# Patient Record
Sex: Male | Born: 1950 | Race: White | Hispanic: No | Marital: Married | Smoking: Never smoker
Health system: Southern US, Community
[De-identification: ages and names within clinical notes are randomized; demographics above are authoritative.]

## PROBLEM LIST (undated history)

## (undated) DIAGNOSIS — I219 Acute myocardial infarction, unspecified: Secondary | ICD-10-CM

## (undated) DIAGNOSIS — F419 Anxiety disorder, unspecified: Secondary | ICD-10-CM

## (undated) DIAGNOSIS — E7800 Pure hypercholesterolemia, unspecified: Secondary | ICD-10-CM

## (undated) DIAGNOSIS — T4145XA Adverse effect of unspecified anesthetic, initial encounter: Secondary | ICD-10-CM

## (undated) DIAGNOSIS — I2089 Other forms of angina pectoris: Secondary | ICD-10-CM

## (undated) DIAGNOSIS — I739 Peripheral vascular disease, unspecified: Secondary | ICD-10-CM

## (undated) DIAGNOSIS — T8859XA Other complications of anesthesia, initial encounter: Secondary | ICD-10-CM

## (undated) DIAGNOSIS — F32A Depression, unspecified: Secondary | ICD-10-CM

## (undated) DIAGNOSIS — K635 Polyp of colon: Secondary | ICD-10-CM

## (undated) DIAGNOSIS — M1990 Unspecified osteoarthritis, unspecified site: Secondary | ICD-10-CM

## (undated) DIAGNOSIS — I10 Essential (primary) hypertension: Secondary | ICD-10-CM

## (undated) DIAGNOSIS — I2510 Atherosclerotic heart disease of native coronary artery without angina pectoris: Secondary | ICD-10-CM

## (undated) DIAGNOSIS — I208 Other forms of angina pectoris: Secondary | ICD-10-CM

## (undated) DIAGNOSIS — G4733 Obstructive sleep apnea (adult) (pediatric): Secondary | ICD-10-CM

## (undated) DIAGNOSIS — F329 Major depressive disorder, single episode, unspecified: Secondary | ICD-10-CM

## (undated) HISTORY — DX: Other forms of angina pectoris: I20.89

## (undated) HISTORY — DX: Other forms of angina pectoris: I20.8

## (undated) HISTORY — DX: Anxiety disorder, unspecified: F41.9

## (undated) HISTORY — DX: Obstructive sleep apnea (adult) (pediatric): G47.33

## (undated) HISTORY — PX: TOTAL HIP ARTHROPLASTY: SHX124

## (undated) HISTORY — DX: Pure hypercholesterolemia, unspecified: E78.00

## (undated) HISTORY — DX: Major depressive disorder, single episode, unspecified: F32.9

## (undated) HISTORY — DX: Depression, unspecified: F32.A

## (undated) HISTORY — PX: CORONARY ANGIOPLASTY WITH STENT PLACEMENT: SHX49

## (undated) HISTORY — PX: CARDIAC CATHETERIZATION: SHX172

## (undated) HISTORY — DX: Peripheral vascular disease, unspecified: I73.9

## (undated) HISTORY — DX: Polyp of colon: K63.5

---

## 2003-01-25 ENCOUNTER — Encounter (INDEPENDENT_AMBULATORY_CARE_PROVIDER_SITE_OTHER): Payer: Self-pay | Admitting: Gastroenterology

## 2007-02-02 ENCOUNTER — Encounter: Admission: RE | Admit: 2007-02-02 | Discharge: 2007-02-02 | Payer: Self-pay | Admitting: Cardiology

## 2007-02-03 ENCOUNTER — Ambulatory Visit (HOSPITAL_COMMUNITY): Admission: RE | Admit: 2007-02-03 | Discharge: 2007-02-03 | Payer: Self-pay | Admitting: Cardiology

## 2009-12-04 ENCOUNTER — Encounter (INDEPENDENT_AMBULATORY_CARE_PROVIDER_SITE_OTHER): Payer: Self-pay | Admitting: *Deleted

## 2010-01-12 ENCOUNTER — Encounter (INDEPENDENT_AMBULATORY_CARE_PROVIDER_SITE_OTHER): Payer: Self-pay

## 2010-01-16 ENCOUNTER — Ambulatory Visit: Payer: Self-pay | Admitting: Gastroenterology

## 2010-02-05 ENCOUNTER — Ambulatory Visit: Payer: Self-pay | Admitting: Gastroenterology

## 2010-02-05 DIAGNOSIS — K635 Polyp of colon: Secondary | ICD-10-CM

## 2010-02-05 HISTORY — DX: Polyp of colon: K63.5

## 2010-02-06 ENCOUNTER — Encounter: Payer: Self-pay | Admitting: Gastroenterology

## 2010-04-17 NOTE — Procedures (Signed)
Summary: Colonoscopy  Patient: Larry Arroyo Note: All result statuses are Final unless otherwise noted.  Tests: (1) Colonoscopy (COL)   COL Colonoscopy           DONE     [facility]     [address]     [place], [place]  [address]           COLONOSCOPY PROCEDURE REPORT     PATIENT:  [name], [name]  MR#:  [id number]     BIRTHDATE:  1951-02-23, 59 yrs. old  GENDER:  male     ENDOSCOPIST:  Judie Petit T. Russella Dar, MD, [facility]           PROCEDURE DATE:  02/05/2010     PROCEDURE:  Colonoscopy with snare polypectomy     ASA CLASS:  Class II     INDICATIONS:  1) Routine Risk Screening     MEDICATIONS:   Fentanyl 75 mcg IV, Versed 10 mg IV     DESCRIPTION OF PROCEDURE:   After the risks benefits and     alternatives of the procedure were thoroughly explained, informed     consent was obtained.  Digital rectal exam was performed and     revealed no abnormalities.   The LB [id number]-[id number] endoscope     was introduced through the anus and advanced to the cecum, which     was identified by both the appendix and ileocecal valve, without     limitations.  The quality of the prep was good, using MoviPrep.     The instrument was then slowly withdrawn as the colon was fully     examined.     <<PROCEDUREIMAGES>>     FINDINGS:  A sessile polyp was found in the ascending colon. It     was 11 mm in size. Polyp was snared, then cauterized with     monopolar cautery. Retrieval was successful. Moderate     diverticulosis was found in the sigmoid to transverse colon. This     was otherwise a normal examination of the colon. Retroflexed views     in the rectum revealed no abnormalities.  The time to cecum =  2     minutes. The scope was then withdrawn (time =  11.33  min) from the     patient and the procedure completed.           COMPLICATIONS:  None           ENDOSCOPIC IMPRESSION:     1) 11 mm sessile polyp in the ascending colon     2) Moderate diverticulosis in the sigmoid to transverse         RECOMMENDATIONS:     1) Hold aspirin, aspirin products, and anti-inflamatory     medication for 2 weeks.     2) Await pathology results     3) High fiber diet with liberal fluid intake.     4) If the polyp removed is adenomatous (pre-cancerous) polyps,     repeat colonoscopy in 5 years. Otherwise continue colorectal     cancer screening guidelines for "routine risk" patients with     colonoscopy in 10 years.           Venita Lick. Russella Dar, MD, [facility]           n.     eSIGNED:   Venita Lick. Stark at 02/05/2010 10:02 AM           Oralia Manis, [id number]  Note: An exclamation mark (!) indicates a result that was not dispersed into the flowsheet. Document Creation Date: 02/05/2010 10:02 AM _______________________________________________________________________  (1) Order result status: Final Collection or observation date-time: 02/05/2010 09:57 Requested date-time:  Receipt date-time:  Reported date-time:  Referring Physician:   Ordering Physician: Claudette Head [phone]) Specimen Source:  Source: Launa Grill Order Number: [id number] Lab site:   Appended Document: Colonoscopy     Procedures Next Due Date:    Colonoscopy: 01/2015

## 2010-04-17 NOTE — Miscellaneous (Signed)
Summary: Lec previsit  Clinical Lists Changes  Medications: Added new medication of MOVIPREP 100 GM  SOLR (PEG-KCL-NACL-NASULF-NA ASC-C) As per prep instructions. - Signed Rx of MOVIPREP 100 GM  SOLR (PEG-KCL-NACL-NASULF-NA ASC-C) As per prep instructions.;  #1 x 0;  Signed;  Entered by: Ulis Rias RN;  Authorized by: Meryl Dare MD [facility];  Method used: [id number] [address] [place], [place]  [address], Ph: [phone] or [phone], Fax: [phone] Observations: Added new observation of NKA: T (01/16/2010 8:41)    Prescriptions: MOVIPREP 100 GM  SOLR (PEG-KCL-NACL-NASULF-NA ASC-C) As per prep instructions.  #1 x 0   Entered by:   Ulis Rias RN   Authorized by:   Meryl Dare MD [facility]   Signed by:   Ulis Rias RN on 01/16/2010   Method used:   [facility] [phone][facility] (retail)       [address]       [place], [place]  [address]       Ph: [phone] or [phone]       Fax: [phone]   RxID:   [id number]

## 2010-04-17 NOTE — Letter (Signed)
Summary: [facility] Instructions  [facility]  [address] [place], [place] [address]   Phone: [phone]  Fax: [phone]       Larry Arroyo    [birth year]    MRN: [id number]        Procedure Day /Date:  Monday 02/05/2010     Arrival Time: 8:30 am      Procedure Time: 9:30 am     Location of Procedure:                    _x _  [facility] (4th Floor)                        PREPARATION FOR COLONOSCOPY WITH MOVIPREP   Starting 5 days prior to your procedure Wednesday 11/16 do not eat nuts, seeds, popcorn, corn, beans, peas,  salads, or any raw vegetables.  Do not take any fiber supplements (e.g. Metamucil, Citrucel, and Benefiber).  THE DAY BEFORE YOUR PROCEDURE         DATE: Sunday 11/20  1.  Drink clear liquids the entire day-NO SOLID FOOD  2.  Do not drink anything colored red or purple.  Avoid juices with pulp.  No orange juice.  3.  Drink at least 64 oz. (8 glasses) of fluid/clear liquids during the day to prevent dehydration and help the prep work efficiently.  CLEAR LIQUIDS INCLUDE: Water Jello Ice Popsicles Tea (sugar ok, no milk/cream) Powdered fruit flavored drinks Coffee (sugar ok, no milk/cream) Gatorade Juice: apple, white grape, white cranberry  Lemonade Clear bullion, consomm, broth Carbonated beverages (any kind) Strained chicken noodle soup Hard Candy                             4.  In the morning, mix first dose of MoviPrep solution:    Empty 1 Pouch A and 1 Pouch B into the disposable container    Add lukewarm drinking water to the top line of the container. Mix to dissolve    Refrigerate (mixed solution should be used within 24 hrs)  5.  Begin drinking the prep at 5:00 p.m. The MoviPrep container is divided by 4 marks.   Every 15 minutes drink the solution down to the next mark (approximately 8 oz) until the full liter is complete.   6.  Follow completed prep with 16 oz of clear liquid of your choice  (Nothing red or purple).  Continue to drink clear liquids until bedtime.  7.  Before going to bed, mix second dose of MoviPrep solution:    Empty 1 Pouch A and 1 Pouch B into the disposable container    Add lukewarm drinking water to the top line of the container. Mix to dissolve    Refrigerate  THE DAY OF YOUR PROCEDURE      DATE: Monday 11/21  Beginning at 4:30 a.m. (5 hours before procedure):         1. Every 15 minutes, drink the solution down to the next mark (approx 8 oz) until the full liter is complete.  2. Follow completed prep with 16 oz. of clear liquid of your choice.    3. You may drink clear liquids until 7:30 am (2 HOURS BEFORE PROCEDURE).   MEDICATION INSTRUCTIONS  Unless otherwise instructed, you should take regular prescription medications with a small sip of water   as early as possible the morning of  your procedure.  Additional medication instructions: Do not take HCTZ am of procedure.         OTHER INSTRUCTIONS  You will need a responsible adult at least 61 years of age to accompany you and drive you home.   This person must remain in the waiting room during your procedure.  Wear loose fitting clothing that is easily removed.  Leave jewelry and other valuables at home.  However, you may wish to bring a book to read or  an iPod/MP3 player to listen to music as you wait for your procedure to start.  Remove all body piercing jewelry and leave at home.  Total time from sign-in until discharge is approximately 2-3 hours.  You should go home directly after your procedure and rest.  You can resume normal activities the  day after your procedure.  The day of your procedure you should not:   Drive   Make legal decisions   Operate machinery   Drink alcohol   Return to work  You will receive specific instructions about eating, activities and medications before you leave.    The above instructions have been reviewed and explained to me by    Ulis Rias RN  January 16, 2010 9:31 AM     I fully understand and can verbalize these instructions _____________________________ Date _________

## 2010-04-17 NOTE — Letter (Signed)
Summary: Patient Notice- Polyp Results  [facility]  [address] [place], [place] [address]   Phone: [phone]  Fax: [phone]        February 06, 2010 MRN: [id number]    Larry Arroyo [address] [place], [place]  [address]    Dear [name],  I am pleased to inform you that the colon polyp(s) removed during your recent colonoscopy was (were) found to be benign (no cancer detected) upon pathologic examination.  I recommend you have a repeat colonoscopy examination in 5 years to look for recurrent polyps, as having colon polyps increases your risk for having recurrent polyps or even colon cancer in the future.  Should you develop new or worsening symptoms of abdominal pain, bowel habit changes or bleeding from the rectum or bowels, please schedule an evaluation with either your primary care physician or with me.  Continue treatment plan as outlined the day of your exam.  Please call us if you are having persistent problems or have questions about your condition that have not been fully answered at this time.  Sincerely,  Meryl Dare MD [facility]  This letter has been electronically signed by your physician.  Appended Document: Patient Notice- Polyp Results Letter mailed

## 2010-04-17 NOTE — Letter (Signed)
Summary: Pre Visit Letter Revised  [facility]  [address] [place], [place] [address]   Phone: [phone]  Fax: [phone]        12/04/2009 MRN: [id number]   Larry Arroyo [address] [place], [place]  [address]  [place]                Welcome to the Gastroenterology Division at [facility].    You are scheduled to see a nurse for your pre-procedure visit on January 16, 2010 at 9:00am on the 3rd floor at [facility], [address]  We ask that you try to arrive at our office 15 minutes prior to your appointment time to allow for check-in.  Please take a minute to review the attached form.  If you answer "Yes" to one or more of the questions on the first page, we ask that you call the person listed at your earliest opportunity.  If you answer "No" to all of the questions, please complete the rest of the form and bring it to your appointment.    Your nurse visit will consist of discussing your medical and surgical history, your immediate family medical history, and your medications.   If you are unable to list all of your medications on the form, please bring the medication bottles to your appointment and we will list them.  We will need to be aware of both prescribed and over the counter drugs.  We will need to know exact dosage information as well.    Please be prepared to read and sign documents such as consent forms, a financial agreement, and acknowledgement forms.  If necessary, and with your consent, a friend or relative is welcome to sit-in on the nurse visit with you.  Please bring your insurance card so that we may make a copy of it.  If your insurance requires a referral to see a specialist, please bring your referral form from your primary care physician.  No co-pay is required for this nurse visit.     If you cannot keep your appointment, please call [phone] to cancel or reschedule prior to your appointment date.  This allows [place] the  opportunity to schedule an appointment for another patient in need of care.    Thank you for choosing [facility] for your medical needs.  We appreciate the opportunity to care for you.  Please visit [place] at our website  to learn more about our practice.  Sincerely, The Gastroenterology Division

## 2010-07-31 NOTE — Cardiovascular Report (Signed)
Larry Arroyo, Larry Arroyo                ACCOUNT NO.:  [id number]   MEDICAL RECORD NO.:  [id number]          PATIENT TYPE:  OIB   LOCATION:  2899                         FACILITY:  [facility]   PHYSICIAN:  Armanda Magic, M.D.     DATE OF BIRTH:  08/05/1950   DATE OF PROCEDURE:  02/03/2007  DATE OF DISCHARGE:  02/03/2007                            CARDIAC CATHETERIZATION   PROCEDURE:  1. Left heart catheterization.  2. Coronary angiography.  3. Left ventriculography.   OPERATOR:  Armanda Magic, M.D.   INDICATIONS:  Chest pain, abnormal Cardiolite.   COMPLICATIONS:  None.   INTRAVENOUS ACCESS:  Via right femoral artery 6-French sheath.   INDICATION:  This is a 60 year old male with a history of chest pain,  who presented for stress testing which showed a reversible anterior wall  defect.  He now presents for cardiac catheterization.   DESCRIPTION OF PROCEDURE:  The patient was brought to the cardiac  catheterization laboratory in the fasting, nonsedated state.  Informed  consent was obtained.  The patient was connected to continuous heart  rate and pulse oximetry monitoring and intermittent blood pressure  monitoring.  The right groin was prepped and draped in sterile fashion.  Xylocaine 1% was used for local anesthesia.  Using a modified Seldinger  technique, a 6-French sheath was placed in the right femoral artery.  Under fluoroscopic guidance, a 6-French JL-4 catheter was placed in the  left coronary artery.  Multiple cine films were taken in 30-degree RAO  and 40-degree LAO views.  This catheter was then exchanged out over a  guidewire for a 6-French JR-4 catheter, which was placed under  fluoroscopic guidance in the right coronary artery.  Multiple cine films  taken in the 30-degree RAO and 40-degree LAO views.  This catheter was  exchanged out over a guidewire for a 6-French angled pigtail catheter.  The catheter was placed in the left ventricular cavity under  fluoroscopic guidance.   Left ventriculography was performed in a 30-  degree RAO view using a total of 30 mL of contrast at 15 mL per second.  The catheter was then pulled back across the aortic valve with no  significant gradient noted.  At the end of the procedure, all catheters  and sheaths were removed.  Manual compression was performed until  adequate hemostasis was obtained.  The patient was transferred back to  the room in stable condition.   RESULTS:  1. The left main is widely patent, but very short and immediately      bifurcates into a left anterior descending artery and left      circumflex artery.  2. The left anterior descending artery is widely patent throughout its      course to the apex.  It gives rise to a first diagonal branch,      which has a 60% proximal stenosis and then bifurcates into 2      daughter branches.  The superior branch is widely patent.  The      inferior branch has a 70% ostial stenosis, but is too small for  intervention.  3. The left circumflex is widely patent throughout its course in the      AV groove, giving rise to 3 obtuse marginal branches, all of which      are widely patent.  4. The right coronary artery is widely patent, but has a 40% to 50%      eccentric plaque in the midportion and then distally has a 30% to      40% eccentric plaque before bifurcating into a posterior descending      artery and posterolateral artery, both of which are widely patent.  5. Left ventriculography shows normal LV function, EF 60%, LV pressure      94/24 mmHg, aortic pressure 88/55 mmHg.   ASSESSMENT:  1. 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  2. Normal left ventricular function  3. Hypertension.   PLAN:  Medical management, aggressive treatment of blood pressure,  aspirin daily, check a  fasting lipid panel, discharge to home after IV  fluid and bedrest, Imdur 30 mg a day and follow up with me in 2 weeks.      Armanda Magic, M.D.  Electronically Signed     TT/MEDQ  D:  02/06/2007  T:  02/07/2007  Job:  [id number]

## 2011-02-01 ENCOUNTER — Ambulatory Visit (HOSPITAL_COMMUNITY)
Admission: RE | Admit: 2011-02-01 | Discharge: 2011-02-01 | Disposition: A | Discharge status: Home / Self Care | Payer: PRIVATE HEALTH INSURANCE | Source: Ambulatory Visit | Attending: Orthopedic Surgery | Admitting: Orthopedic Surgery

## 2011-02-01 ENCOUNTER — Encounter (HOSPITAL_COMMUNITY): Payer: Self-pay

## 2011-02-01 ENCOUNTER — Encounter (HOSPITAL_COMMUNITY)
Admission: RE | Admit: 2011-02-01 | Discharge: 2011-02-01 | Disposition: A | Discharge status: Home / Self Care | Payer: PRIVATE HEALTH INSURANCE | Source: Ambulatory Visit | Attending: Orthopedic Surgery | Admitting: Orthopedic Surgery

## 2011-02-01 ENCOUNTER — Other Ambulatory Visit: Payer: Self-pay

## 2011-02-01 ENCOUNTER — Other Ambulatory Visit: Payer: Self-pay | Admitting: Orthopedic Surgery

## 2011-02-01 DIAGNOSIS — Q6589 Other specified congenital deformities of hip: Secondary | ICD-10-CM | POA: Insufficient documentation

## 2011-02-01 DIAGNOSIS — M1610 Unilateral primary osteoarthritis, unspecified hip: Secondary | ICD-10-CM | POA: Insufficient documentation

## 2011-02-01 DIAGNOSIS — M169 Osteoarthritis of hip, unspecified: Secondary | ICD-10-CM | POA: Insufficient documentation

## 2011-02-01 DIAGNOSIS — Z01812 Encounter for preprocedural laboratory examination: Secondary | ICD-10-CM | POA: Insufficient documentation

## 2011-02-01 HISTORY — DX: Unspecified osteoarthritis, unspecified site: M19.90

## 2011-02-01 HISTORY — DX: Essential (primary) hypertension: I10

## 2011-02-01 HISTORY — DX: Atherosclerotic heart disease of native coronary artery without angina pectoris: I25.10

## 2011-02-01 HISTORY — DX: Adverse effect of unspecified anesthetic, initial encounter: T41.45XA

## 2011-02-01 HISTORY — DX: Other complications of anesthesia, initial encounter: T88.59XA

## 2011-02-01 LAB — COMPREHENSIVE METABOLIC PANEL
ALT: 24 U/L (ref 0–53)
AST: 19 U/L (ref 0–37)
BUN: 15 mg/dL (ref 6–23)
CO2: 31 mEq/L (ref 19–32)
Calcium: 10.4 mg/dL (ref 8.4–10.5)
Chloride: 97 mEq/L (ref 96–112)
Creatinine, Ser: 1 mg/dL (ref 0.50–1.35)
GFR calc non Af Amer: 79 mL/min — ABNORMAL LOW (ref >90)
Sodium: 135 mEq/L (ref 135–145)
Total Bilirubin: 0.3 mg/dL (ref 0.3–1.2)

## 2011-02-01 LAB — URINALYSIS, ROUTINE W REFLEX MICROSCOPIC
Hgb urine dipstick: NEGATIVE
Leukocytes, UA: NEGATIVE
Protein, ur: NEGATIVE mg/dL
Specific Gravity, Urine: 1.014 (ref 1.005–1.030)

## 2011-02-01 LAB — CBC
HCT: 41 % (ref 39.0–52.0)
Hemoglobin: 13.8 g/dL (ref 13.0–17.0)
MCV: 88.2 fL (ref 78.0–100.0)
RBC: 4.65 MIL/uL (ref 4.22–5.81)
RDW: 14.3 % (ref 11.5–15.5)
WBC: 7.6 10*3/uL (ref 4.0–10.5)

## 2011-02-01 LAB — SURGICAL PCR SCREEN
MRSA, PCR: NEGATIVE
Staphylococcus aureus: NEGATIVE

## 2011-02-01 LAB — PROTIME-INR: INR: 1.02 (ref 0.00–1.49)

## 2011-02-01 LAB — APTT: aPTT: 36 seconds (ref 24–37)

## 2011-02-01 NOTE — Pre-Procedure Instructions (Signed)
Requested eccho and stress test Dr Malachy Mood office-  Was told there he has had recent nuclear stress test- request records to be sent 02/01/11

## 2011-02-01 NOTE — Pre-Procedure Instructions (Signed)
States cath, and stress test done 2008, ? eccho

## 2011-02-01 NOTE — Patient Instructions (Signed)
[address] Larry Arroyo  02/01/2011   Your procedure is scheduled on: 02/11/2011   Monday   1440-1600  Report to [facility] at 1230 AM.  Call this number if you have problems the morning of surgery: [phone]   Remember:   Do not eat food:After Midnight. Sunday NIGHT  Do not drink clear liquids: 6 Hours before arrival. Clear liquids until 0630, then NONE  Take these medicines the morning of surgery with A SIP OF WATER: COREG with sip water,  NORCO IF NEEDED WITH SIP WATER   Do not wear jewelry, make-up or nail polish.  Do not wear lotions, powders, or perfumes. You may wear deodorant.  Do not shave 48 hours prior to surgery.  Do not bring valuables to the hospital.  Contacts, dentures or bridgework may not be worn into surgery.  Leave suitcase in the car. After surgery it may be brought to your room.  For patients admitted to the hospital, checkout time is 11:00 AM the day of discharge.   Patients discharged the day of surgery will not be allowed to drive home.  Name and phone number of your driver: Larry Arroyo   wife  Special Instructions: CHG Shower Use Special Wash: 1/2 bottle night before surgery and 1/2 bottle morning of surgery.  REGULAR SOAP FACE AND PRIVATES   Please read over the following fact sheets that you were given: MRSA Information

## 2011-02-01 NOTE — H&P (Signed)
Larry Arroyo  DOB: 02/07/1951  Date Of Admission: 02/11/2011  Chief Complaint:  Right Hip Pain  History of Present Illness The patient is a 60 year old male who comes in today for a preoperative History and Physical. The patient is scheduled for a right total hip arthroplasty to be performed by Dr. Frank V. Aluisio, MD at [facility] on 02/11/2011.They have been seen for right hip and increasing pain and instability. Accompanied by his wife today.  Allergies No Known Drug Allergies.   Medications Ramipril (5MG Capsule, Oral two times daily) Active. Coreg (3.125MG Tablet, Oral two times daily) Active. Hydrochlorothiazide (25MG Tablet, Oral daily) Active. Lipitor (40MG Tablet, Oral daily) Active. Aspirin EC (81MG Tablet DR, Oral daily) Active.  Problem List/Past Medical Hypertension Coronary Artery Disease/Heart Disease Hypercholesterolemia  Past Surgical History Total Hip Replacement - Left. Date: 04/1994. Cardiac Cath. Date: 01/2007.  Family History Father. Heart Failure  Social History Tobacco use. Never smoker. Alcohol use. Occasional alcohol use. 2 Beers Children. 1 Advance Directives. Living Will  Review of Systems General:Not Present- Chills, Fever, Night Sweats, Fatigue, Weight Gain, Weight Loss and Memory Loss. Skin:Not Present- Hives, Itching, Rash, Eczema and Lesions. HEENT:Not Present- Tinnitus, Headache, Double Vision, Visual Loss, Hearing Loss and Dentures. Respiratory:Not Present- Shortness of breath with exertion, Shortness of breath at rest, Allergies, Coughing up blood and Chronic Cough. Cardiovascular:Not Present- Chest Pain, Racing/skipping heartbeats, Difficulty Breathing Lying Down, Murmur, Swelling and Palpitations. Gastrointestinal:Not Present- Bloody Stool, Heartburn, Abdominal Pain, Vomiting, Nausea, Constipation, Diarrhea, Difficulty Swallowing, Jaundice and Loss of appetitie. Male Genitourinary:Not  Present- Urinary frequency, Blood in Urine, Weak urinary stream, Discharge, Flank Pain, Incontinence, Painful Urination, Urgency, Urinary Retention and Urinating at Night. Musculoskeletal:Not Present- Muscle Weakness, Muscle Pain, Joint Swelling, Joint Pain, Back Pain, Morning Stiffness and Spasms. Neurological:Not Present- Tremor, Dizziness, Blackout spells, Paralysis, Difficulty with balance and Weakness. Psychiatric:Not Present- Insomnia.  Vitals Weight: 165 lb Height: 70 in Weight was reported by patient. Height was reported by patient. Body Surface Area: 1.92 m Body Mass Index: 23.67 kg/m Pulse: 68 (Regular) Resp.: 12 (Unlabored) BP: 124/76 (Sitting, Left Arm, Standard)  Physical Exam The physical exam findings are as follows:  General Mental Status - Alert, cooperative and good historian. General Appearance- pleasant and Anxious (Mild). Not in acute distress. Orientation- Oriented X3. Build & Nutrition- Well nourished and Well developed. Mental Status- cooperative and good historian.  Head and Neck Head- normocephalic, atraumatic . Neck Global Assessment- supple. no bruit auscultated on the right and no bruit auscultated on the left.  Eye Pupil- Bilateral- PERRLA. Motion- Bilateral- EOMI.  Chest and Lung Exam Auscultation: Breath sounds:- clear at anterior chest wall and - clear at posterior chest wall. Adventitious sounds:- No Adventitious sounds.  Cardiovascular Auscultation:Rhythm- Regular rate and rhythm. Heart Sounds- S1 WNL and S2 WNL. Murmurs & Other Heart Sounds:Auscultation of the heart reveals - No Murmurs.  Abdomen Palpation/Percussion:Tenderness- Abdomen is non-tender to palpation. Rigidity (guarding)- Abdomen is soft. Auscultation:Auscultation of the abdomen reveals - Bowel sounds normal.  Male Genitourinary Note: Not done, not pertinent to present illness  Musculoskeletal Note: On physical exam a well  developed male, alert and oriented in no apparent distress. He walks with a significantly antalgic gait pattern on the right. His right hip can be flexed 90, no internal rotation, about 20 external rotation, 20 abduction. The left hip flexes to 110, rotate in 30, out 40, abduct 40. His strength is intact both lower extremities. Pulses and sensation are intact.  RADIOGRAPHS:    Radiographs were reviewed from 7/18, AP pelvis and lateral of the hip, and these were brought to us from his primary care office. These were supine radiographs, not standing. He had bone on bone change focally superior lateral on the right hip. There was impingement type morphology also.  Assessment & Plan Osteoarthritis Right Hip  Note: Plan is for a Right Total Hip Arthroplasty per Dr. Aluisio. Risks and benefits of the surgery have been discussed with the patient and they elect to proceed with surgery.  There are on active contraindications to upcoming procedure such as ongoing infection or progressive neurological disease.  Drew Sydny Schnitzler, PA-C   

## 2011-02-04 ENCOUNTER — Encounter (HOSPITAL_COMMUNITY): Payer: Self-pay

## 2011-02-05 ENCOUNTER — Encounter (HOSPITAL_COMMUNITY): Payer: Self-pay

## 2011-02-10 MED ORDER — BUPIVACAINE 0.25 % ON-Q PUMP SINGLE CATH 300ML
300.0000 mL | INJECTION | Status: DC
  Filled 2011-02-10: qty 300

## 2011-02-11 ENCOUNTER — Inpatient Hospital Stay (HOSPITAL_COMMUNITY)
Admission: RE | Admit: 2011-02-11 | Discharge: 2011-02-13 | DRG: 470 | Disposition: A | Discharge status: Home Health | Payer: Private Health Insurance - Indemnity | Source: Ambulatory Visit | Attending: Orthopedic Surgery | Admitting: Orthopedic Surgery

## 2011-02-11 ENCOUNTER — Encounter (HOSPITAL_COMMUNITY)
Admission: RE | Disposition: A | Discharge status: Home Health | Payer: Self-pay | Source: Ambulatory Visit | Attending: Orthopedic Surgery

## 2011-02-11 ENCOUNTER — Encounter (HOSPITAL_COMMUNITY): Payer: Self-pay | Admitting: Anesthesiology

## 2011-02-11 ENCOUNTER — Inpatient Hospital Stay (HOSPITAL_COMMUNITY): Payer: Private Health Insurance - Indemnity

## 2011-02-11 ENCOUNTER — Encounter (HOSPITAL_COMMUNITY): Payer: Self-pay | Admitting: Orthopedic Surgery

## 2011-02-11 ENCOUNTER — Encounter (HOSPITAL_COMMUNITY): Payer: Self-pay | Admitting: *Deleted

## 2011-02-11 ENCOUNTER — Inpatient Hospital Stay (HOSPITAL_COMMUNITY): Payer: Private Health Insurance - Indemnity | Admitting: Anesthesiology

## 2011-02-11 DIAGNOSIS — E871 Hypo-osmolality and hyponatremia: Secondary | ICD-10-CM | POA: Diagnosis not present

## 2011-02-11 DIAGNOSIS — I2510 Atherosclerotic heart disease of native coronary artery without angina pectoris: Secondary | ICD-10-CM | POA: Diagnosis present

## 2011-02-11 DIAGNOSIS — I10 Essential (primary) hypertension: Secondary | ICD-10-CM | POA: Diagnosis present

## 2011-02-11 DIAGNOSIS — M1611 Unilateral primary osteoarthritis, right hip: Secondary | ICD-10-CM | POA: Diagnosis present

## 2011-02-11 DIAGNOSIS — M169 Osteoarthritis of hip, unspecified: Principal | ICD-10-CM | POA: Diagnosis present

## 2011-02-11 DIAGNOSIS — Z96649 Presence of unspecified artificial hip joint: Secondary | ICD-10-CM

## 2011-02-11 DIAGNOSIS — M1610 Unilateral primary osteoarthritis, unspecified hip: Principal | ICD-10-CM | POA: Diagnosis present

## 2011-02-11 HISTORY — PX: TOTAL HIP ARTHROPLASTY: SHX124

## 2011-02-11 LAB — ABO/RH: ABO/RH(D): O POS

## 2011-02-11 LAB — TYPE AND SCREEN
ABO/RH(D): O POS
Antibody Screen: NEGATIVE

## 2011-02-11 SURGERY — ARTHROPLASTY, HIP, TOTAL,POSTERIOR APPROACH
Anesthesia: Spinal | Site: Hip | Laterality: Right | Wound class: Clean

## 2011-02-11 MED ORDER — MAGNESIUM HYDROXIDE 400 MG/5ML PO SUSP: 30.0000 mL | Freq: Two times a day (BID) | ORAL | Status: DC | PRN

## 2011-02-11 MED ORDER — CEFAZOLIN SODIUM 1-5 GM-% IV SOLN
1.0000 g | Freq: Four times a day (QID) | INTRAVENOUS | Status: AC
  Administered 2011-02-11 – 2011-02-12 (×3): 1 g via INTRAVENOUS
  Filled 2011-02-11 (×5): qty 50

## 2011-02-11 MED ORDER — OXYCODONE HCL 5 MG PO TABS
5.0000 mg | ORAL_TABLET | ORAL | Status: DC | PRN
  Administered 2011-02-11: 5 mg via ORAL
  Administered 2011-02-12 – 2011-02-13 (×6): 10 mg via ORAL
  Filled 2011-02-11 (×4): qty 2
  Filled 2011-02-11: qty 1
  Filled 2011-02-11 (×3): qty 2

## 2011-02-11 MED ORDER — TETRAHYDROZOLINE-ZN SULFATE 0.05-0.25 % OP SOLN
2.0000 [drp] | Freq: Three times a day (TID) | OPHTHALMIC | Status: DC | PRN
  Filled 2011-02-11 (×2): qty 30

## 2011-02-11 MED ORDER — DEXAMETHASONE SODIUM PHOSPHATE 4 MG/ML IJ SOLN
8.0000 mg | Freq: Once | INTRAMUSCULAR | Status: DC | PRN
  Filled 2011-02-11: qty 2

## 2011-02-11 MED ORDER — ONDANSETRON HCL 4 MG/2ML IJ SOLN
4.0000 mg | Freq: Four times a day (QID) | INTRAMUSCULAR | Status: DC | PRN
  Administered 2011-02-12: 4 mg via INTRAVENOUS
  Filled 2011-02-11: qty 2

## 2011-02-11 MED ORDER — LACTATED RINGERS IV SOLN
INTRAVENOUS | Status: DC
  Administered 2011-02-11 (×2): via INTRAVENOUS
  Administered 2011-02-11: 1000 mL via INTRAVENOUS

## 2011-02-11 MED ORDER — DOCUSATE SODIUM 100 MG PO CAPS
100.0000 mg | ORAL_CAPSULE | Freq: Two times a day (BID) | ORAL | Status: DC
  Administered 2011-02-11 – 2011-02-13 (×4): 100 mg via ORAL
  Filled 2011-02-11 (×5): qty 1

## 2011-02-11 MED ORDER — ONDANSETRON HCL 4 MG/2ML IJ SOLN
INTRAMUSCULAR | Status: DC | PRN
  Administered 2011-02-11: 4 mg via INTRAVENOUS

## 2011-02-11 MED ORDER — ACETAMINOPHEN 10 MG/ML IV SOLN
INTRAVENOUS | Status: DC | PRN
  Administered 2011-02-11: 1000 mg via INTRAVENOUS

## 2011-02-11 MED ORDER — RAMIPRIL 5 MG PO CAPS
5.0000 mg | ORAL_CAPSULE | Freq: Two times a day (BID) | ORAL | Status: DC
  Administered 2011-02-11 – 2011-02-13 (×3): 5 mg via ORAL
  Filled 2011-02-11 (×5): qty 1

## 2011-02-11 MED ORDER — MORPHINE SULFATE 2 MG/ML IJ SOLN
1.0000 mg | INTRAMUSCULAR | Status: DC | PRN
  Administered 2011-02-11 – 2011-02-12 (×5): 2 mg via INTRAVENOUS
  Filled 2011-02-11 (×5): qty 1

## 2011-02-11 MED ORDER — RIVAROXABAN 10 MG PO TABS
10.0000 mg | ORAL_TABLET | ORAL | Status: DC
  Administered 2011-02-12 – 2011-02-13 (×2): 10 mg via ORAL
  Filled 2011-02-11 (×2): qty 1

## 2011-02-11 MED ORDER — LACTATED RINGERS IV SOLN: INTRAVENOUS | Status: DC

## 2011-02-11 MED ORDER — BISACODYL 5 MG PO TBEC: 10.0000 mg | DELAYED_RELEASE_TABLET | Freq: Every day | ORAL | Status: DC | PRN

## 2011-02-11 MED ORDER — METOCLOPRAMIDE HCL 5 MG/ML IJ SOLN: 5.0000 mg | Freq: Three times a day (TID) | INTRAMUSCULAR | Status: DC | PRN

## 2011-02-11 MED ORDER — METHOCARBAMOL 500 MG PO TABS
500.0000 mg | ORAL_TABLET | Freq: Four times a day (QID) | ORAL | Status: DC | PRN
  Administered 2011-02-11 – 2011-02-12 (×4): 500 mg via ORAL
  Filled 2011-02-11 (×4): qty 1

## 2011-02-11 MED ORDER — FLEET ENEMA 7-19 GM/118ML RE ENEM: 1.0000 | ENEMA | Freq: Every day | RECTAL | Status: DC | PRN

## 2011-02-11 MED ORDER — ACETAMINOPHEN 10 MG/ML IV SOLN
1000.0000 mg | Freq: Four times a day (QID) | INTRAVENOUS | Status: AC
  Administered 2011-02-11 – 2011-02-12 (×4): 1000 mg via INTRAVENOUS
  Filled 2011-02-11 (×5): qty 100

## 2011-02-11 MED ORDER — MENTHOL 3 MG MT LOZG: 1.0000 | LOZENGE | OROMUCOSAL | Status: DC | PRN

## 2011-02-11 MED ORDER — FENTANYL CITRATE 0.05 MG/ML IJ SOLN
50.0000 ug | INTRAMUSCULAR | Status: AC | PRN
  Administered 2011-02-11 (×2): 50 ug via INTRAVENOUS

## 2011-02-11 MED ORDER — ACETAMINOPHEN 325 MG PO TABS
650.0000 mg | ORAL_TABLET | Freq: Four times a day (QID) | ORAL | Status: DC | PRN
  Administered 2011-02-12: 650 mg via ORAL
  Filled 2011-02-11: qty 2

## 2011-02-11 MED ORDER — HYDROMORPHONE HCL PF 2 MG/ML IJ SOLN
0.2500 mg | INTRAMUSCULAR | Status: DC | PRN
  Administered 2011-02-11 (×2): 0.5 mg via INTRAVENOUS

## 2011-02-11 MED ORDER — METOCLOPRAMIDE HCL 10 MG PO TABS: 5.0000 mg | ORAL_TABLET | Freq: Three times a day (TID) | ORAL | Status: DC | PRN

## 2011-02-11 MED ORDER — TEMAZEPAM 15 MG PO CAPS
15.0000 mg | ORAL_CAPSULE | Freq: Every evening | ORAL | Status: DC | PRN
  Administered 2011-02-12: 30 mg via ORAL
  Filled 2011-02-11: qty 2

## 2011-02-11 MED ORDER — ACETAMINOPHEN 650 MG RE SUPP: 650.0000 mg | Freq: Four times a day (QID) | RECTAL | Status: DC | PRN

## 2011-02-11 MED ORDER — PHENYLEPHRINE HCL 10 MG/ML IJ SOLN
10.0000 mg | INTRAVENOUS | Status: DC | PRN
  Administered 2011-02-11: 10 ug/min via INTRAVENOUS

## 2011-02-11 MED ORDER — CARVEDILOL 3.125 MG PO TABS
3.1250 mg | ORAL_TABLET | Freq: Two times a day (BID) | ORAL | Status: DC
  Administered 2011-02-12 – 2011-02-13 (×3): 3.125 mg via ORAL
  Filled 2011-02-11 (×4): qty 1

## 2011-02-11 MED ORDER — ONDANSETRON HCL 4 MG PO TABS: 4.0000 mg | ORAL_TABLET | Freq: Four times a day (QID) | ORAL | Status: DC | PRN

## 2011-02-11 MED ORDER — BISACODYL 10 MG RE SUPP: 10.0000 mg | Freq: Every day | RECTAL | Status: DC | PRN

## 2011-02-11 MED ORDER — SODIUM CHLORIDE 0.9 % IR SOLN
Status: DC | PRN
  Administered 2011-02-11: 1000 mL

## 2011-02-11 MED ORDER — ROSUVASTATIN CALCIUM 40 MG PO TABS
40.0000 mg | ORAL_TABLET | Freq: Every day | ORAL | Status: DC
  Administered 2011-02-12: 40 mg via ORAL
  Filled 2011-02-11 (×2): qty 1

## 2011-02-11 MED ORDER — METHOCARBAMOL 100 MG/ML IJ SOLN: 500.0000 mg | Freq: Four times a day (QID) | INTRAVENOUS | Status: DC | PRN

## 2011-02-11 MED ORDER — HYDROCHLOROTHIAZIDE 25 MG PO TABS
25.0000 mg | ORAL_TABLET | Freq: Every day | ORAL | Status: DC
  Administered 2011-02-11 – 2011-02-13 (×3): 25 mg via ORAL
  Filled 2011-02-11 (×3): qty 1

## 2011-02-11 MED ORDER — POLYETHYLENE GLYCOL 3350 17 G PO PACK
17.0000 g | PACK | Freq: Every day | ORAL | Status: DC | PRN
  Filled 2011-02-11: qty 1

## 2011-02-11 MED ORDER — PROPOFOL 10 MG/ML IV EMUL
INTRAVENOUS | Status: DC | PRN
  Administered 2011-02-11: 100 ug/kg/min via INTRAVENOUS

## 2011-02-11 MED ORDER — MEPERIDINE HCL 50 MG/ML IJ SOLN: 6.2500 mg | INTRAMUSCULAR | Status: DC | PRN

## 2011-02-11 MED ORDER — EPHEDRINE SULFATE 50 MG/ML IJ SOLN
INTRAMUSCULAR | Status: DC | PRN
  Administered 2011-02-11: 10 mg via INTRAVENOUS
  Administered 2011-02-11: 5 mg via INTRAVENOUS

## 2011-02-11 MED ORDER — SODIUM CHLORIDE 0.9 % IJ SOLN
INTRAMUSCULAR | Status: DC | PRN
  Administered 2011-02-11: 50 mL

## 2011-02-11 MED ORDER — CEFAZOLIN SODIUM 1-5 GM-% IV SOLN
1.0000 g | Freq: Once | INTRAVENOUS | Status: AC
  Administered 2011-02-11: 1 g via INTRAVENOUS

## 2011-02-11 MED ORDER — KCL IN DEXTROSE-NACL 20-5-0.9 MEQ/L-%-% IV SOLN
INTRAVENOUS | Status: DC
  Administered 2011-02-11: 22:00:00 via INTRAVENOUS
  Administered 2011-02-12: 20 mL/h via INTRAVENOUS
  Filled 2011-02-11 (×4): qty 1000

## 2011-02-11 MED ORDER — BUPIVACAINE LIPOSOME 1.3 % IJ SUSP
20.0000 mL | INTRAMUSCULAR | Status: AC
  Administered 2011-02-11: 20 mL
  Filled 2011-02-11: qty 20

## 2011-02-11 MED ORDER — KETAMINE HCL 10 MG/ML IJ SOLN
INTRAMUSCULAR | Status: DC | PRN
  Administered 2011-02-11: 40 mg via INTRAVENOUS

## 2011-02-11 MED ORDER — DIPHENHYDRAMINE HCL 12.5 MG/5ML PO ELIX: 12.5000 mg | ORAL_SOLUTION | ORAL | Status: DC | PRN

## 2011-02-11 MED ORDER — PHENYLEPHRINE HCL 10 MG/ML IJ SOLN
INTRAMUSCULAR | Status: DC | PRN
  Administered 2011-02-11 (×2): 40 ug via INTRAVENOUS

## 2011-02-11 MED ORDER — PHENOL 1.4 % MT LIQD: 1.0000 | OROMUCOSAL | Status: DC | PRN

## 2011-02-11 MED ORDER — MIDAZOLAM HCL 5 MG/5ML IJ SOLN
INTRAMUSCULAR | Status: DC | PRN
  Administered 2011-02-11: 2 mg via INTRAVENOUS

## 2011-02-11 MED ORDER — TETRAHYDROZOLINE-ZN SULFATE 0.05-0.25 % OP SOLN: 2.0000 [drp] | Freq: Three times a day (TID) | OPHTHALMIC | Status: DC | PRN

## 2011-02-11 SURGICAL SUPPLY — 44 items
BAG ZIPLOCK 12X15 (MISCELLANEOUS) ×2 IMPLANT
BIT DRILL 2.8X128 (BIT) ×2 IMPLANT
BLADE EXTENDED COATED 6.5IN (ELECTRODE) ×2 IMPLANT
BLADE SAW SAG 73X25 THK (BLADE) ×1
BLADE SAW SGTL 73X25 THK (BLADE) ×1 IMPLANT
CLOSURE STERI STRIP 1/2 X4 (GAUZE/BANDAGES/DRESSINGS) ×2 IMPLANT
CLOTH BEACON ORANGE TIMEOUT ST (SAFETY) ×2 IMPLANT
DRAPE INCISE IOBAN 66X45 STRL (DRAPES) ×2 IMPLANT
DRAPE ORTHO SPLIT 77X108 STRL (DRAPES) ×2
DRAPE POUCH INSTRU U-SHP 10X18 (DRAPES) ×2 IMPLANT
DRAPE SURG ORHT 6 SPLT 77X108 (DRAPES) ×2 IMPLANT
DRAPE U-SHAPE 47X51 STRL (DRAPES) ×2 IMPLANT
DRSG ADAPTIC 3X8 NADH LF (GAUZE/BANDAGES/DRESSINGS) ×2 IMPLANT
DRSG MEPILEX BORDER 4X4 (GAUZE/BANDAGES/DRESSINGS) ×2 IMPLANT
DRSG MEPILEX BORDER 4X8 (GAUZE/BANDAGES/DRESSINGS) ×2 IMPLANT
DURAPREP 26ML APPLICATOR (WOUND CARE) ×2 IMPLANT
ELECT REM PT RETURN 9FT ADLT (ELECTROSURGICAL) ×2
ELECTRODE REM PT RTRN 9FT ADLT (ELECTROSURGICAL) ×1 IMPLANT
EVACUATOR 1/8 PVC DRAIN (DRAIN) ×2 IMPLANT
FACESHIELD LNG OPTICON STERILE (SAFETY) ×8 IMPLANT
GLOVE BIO SURGEON STRL SZ7.5 (GLOVE) ×2 IMPLANT
GLOVE BIO SURGEON STRL SZ8 (GLOVE) ×2 IMPLANT
GLOVE BIOGEL PI IND STRL 8 (GLOVE) ×2 IMPLANT
GLOVE BIOGEL PI INDICATOR 8 (GLOVE) ×2
GOWN PREVENTION PLUS XLARGE (GOWN DISPOSABLE) ×2 IMPLANT
GOWN STRL REIN XL XLG (GOWN DISPOSABLE) ×2 IMPLANT
IMMOBILIZER KNEE 20 (SOFTGOODS) ×2
IMMOBILIZER KNEE 20 THIGH 36 (SOFTGOODS) ×1 IMPLANT
KIT BASIN OR (CUSTOM PROCEDURE TRAY) ×2 IMPLANT
MANIFOLD NEPTUNE II (INSTRUMENTS) ×2 IMPLANT
NS IRRIG 1000ML POUR BTL (IV SOLUTION) ×2 IMPLANT
PACK TOTAL JOINT (CUSTOM PROCEDURE TRAY) ×2 IMPLANT
PASSER SUT SWANSON 36MM LOOP (INSTRUMENTS) ×2 IMPLANT
POSITIONER SURGICAL ARM (MISCELLANEOUS) ×2 IMPLANT
SPONGE GAUZE 4X4 12PLY (GAUZE/BANDAGES/DRESSINGS) ×2 IMPLANT
SUT ETHIBOND NAB CT1 #1 30IN (SUTURE) ×4 IMPLANT
SUT MNCRL AB 4-0 PS2 18 (SUTURE) ×2 IMPLANT
SUT VIC AB 1 CT1 27 (SUTURE) ×3
SUT VIC AB 1 CT1 27XBRD ANTBC (SUTURE) ×3 IMPLANT
SUT VIC AB 2-0 CT1 27 (SUTURE) ×3
SUT VIC AB 2-0 CT1 TAPERPNT 27 (SUTURE) ×3 IMPLANT
TOWEL OR 17X26 10 PK STRL BLUE (TOWEL DISPOSABLE) ×4 IMPLANT
TRAY FOLEY CATH 14FRSI W/METER (CATHETERS) ×2 IMPLANT
WATER STERILE IRR 1500ML POUR (IV SOLUTION) ×2 IMPLANT

## 2011-02-11 NOTE — Interval H&P Note (Signed)
History and Physical Interval Note:   02/11/2011   2:24 PM   Larry Arroyo  has presented today for surgery, with the diagnosis of osteoarthritis right hip  The various methods of treatment have been discussed with the patient and family. After consideration of risks, benefits and other options for treatment, the patient has consented to  Procedure(s): TOTAL HIP ARTHROPLASTY as a surgical intervention .  The patients' history has been reviewed, patient examined, no change in status, stable for surgery.  I have reviewed the patients' chart and labs.  Questions were answered to the patient's satisfaction.     Loanne Drilling  MD

## 2011-02-11 NOTE — Anesthesia Preprocedure Evaluation (Signed)
Anesthesia Evaluation  Patient identified by MRN, date of birth, ID band Patient awake    Reviewed: Allergy & Precautions, H&P , NPO status , Patient's Chart, lab work & pertinent test results  History of Anesthesia Complications Negative for: history of anesthetic complications  Airway Mallampati: II TM Distance: >3 FB Neck ROM: Full    Dental No notable dental hx.    Pulmonary neg pulmonary ROS,  clear to auscultation  Pulmonary exam normal       Cardiovascular hypertension, + CAD (mild no stent) and neg cardio ROS Regular Normal    Neuro/Psych Negative Neurological ROS  Negative Psych ROS   GI/Hepatic negative GI ROS, Neg liver ROS,   Endo/Other  Negative Endocrine ROS  Renal/GU negative Renal ROS  Genitourinary negative   Musculoskeletal negative musculoskeletal ROS (+)   Abdominal   Peds negative pediatric ROS (+)  Hematology negative hematology ROS (+)   Anesthesia Other Findings   Reproductive/Obstetrics negative OB ROS                           Anesthesia Physical Anesthesia Plan  ASA: II  Anesthesia Plan: Spinal   Post-op Pain Management:    Induction:   Airway Management Planned:   Additional Equipment:   Intra-op Plan:   Post-operative Plan:   Informed Consent: I have reviewed the patients History and Physical, chart, labs and discussed the procedure including the risks, benefits and alternatives for the proposed anesthesia with the patient or authorized representative who has indicated his/her understanding and acceptance.   Dental advisory given  Plan Discussed with: CRNA  Anesthesia Plan Comments:         Anesthesia Quick Evaluation

## 2011-02-11 NOTE — Preoperative (Signed)
Beta Blockers   Reason not to administer Beta Blockers:Pt took Coreg 02-11-11 at 0600

## 2011-02-11 NOTE — Anesthesia Postprocedure Evaluation (Signed)
  Anesthesia Post-op Note  Patient: Larry Arroyo  Procedure(s) Performed:  TOTAL HIP ARTHROPLASTY  Patient Location: PACU  Anesthesia Type: Spinal  Level of Consciousness: awake and alert   Airway and Oxygen Therapy: Patient Spontanous Breathing  Post-op Pain: mild  Post-op Assessment: Post-op Vital signs reviewed, Patient's Cardiovascular Status Stable, Respiratory Function Stable, Patent Airway and No signs of Nausea or vomiting  Post-op Vital Signs: stable  Complications: No apparent anesthesia complications

## 2011-02-11 NOTE — Op Note (Signed)
Pre-operative diagnosis- Osteoarthritis Right hip  Post-operative diagnosis- Osteoarthritis  Right hip  Procedure-  RightTotal Hip Arthroplasty  Surgeon- Larry Arroyo. Leron Stoffers, MD  Assistant- Avel Peace, PA-C   Anesthesia  Spinal  EBL- 250   Drain Hemovac   Complication- None  Condition-PACU - hemodynamically stable.   Brief Clinical Note- Larry Arroyo is a 60 y.o. male with end stage arthritis of his right hip with progressively worsening pain and dysfunction. Pain occurs with activity and rest including pain at night. He has tried analgesics, protected weight bearing and rest without benefit. Pain is too severe to attempt physical therapy. Radiographs demonstrate bone on bone arthritis with subchondral cyst formation. He presents now for right THA.  Procedure in detail-   The patient is brought into the operating room and placed on the operating table. After successful administration of Spinal   anesthesia, the patient is placed in the  Left lateral decubitus position with the  Right side up and held in place with the hip positioner. The lower extremity is isolated from the perineum with plastic drapes and time-out is performed by the surgical team. The lower extremity is then prepped and draped in the usual sterile fashion. A short posterolateral incision is made with a ten blade through the subcutaneous tissue to the level of the fascia lata which is incised in line with the skin incision. The sciatic nerve is palpated and protected and the short external rotators and capsule are isolated from the femur. The hip is then dislocated and the center of the femoral head is marked. A trial prosthesis is placed such that the trial head corresponds to the center of the patients' native femoral head. The resection level is marked on the femoral neck and the resection is made with an oscillating saw. The femoral head is removed and femoral retractors placed to gain access to the femoral canal.  The canal finder is passed into the femoral canal and the canal is thoroughly irrigated with sterile saline to remove the fatty contents. Axial reaming is performed to 13.5  mm, proximal reaming to 18D   and the sleeve machined to a large. A 18D large trial sleeve is placed into the proximal femur.      The femur is then retracted anteriorly to gain acetabular exposure. Acetabular retractors are placed and the labrum and osteophytes are removed, Acetabular reaming is performed to 55  mm and a 56  mm Pinnacle acetabular shell is placed in anatomic position with excellent purchase. Additional dome screws were placed. An apex hole eliminator is placed and the permanent 36 mm neutral plus4 Marathon liner is placed into the acetabular shell.      The trial femur is then placed into the femoral canal. The size is 18 x 13  stem with a 36 + 8  neck and a 36 + 0 head with the neck version matching the patients' native anteversion. The hip is reduced with excellent stability with full extension and full external rotation, 70 degrees flexion with 40 degrees adduction and 90 degrees internal rotation and 90 degrees of flexion with 70 degrees of internal rotation. The operative leg is placed on top of the non-operative leg and the leg lengths are found to be equal. The trials are then removed and the permanent implant of the same size is impacted into the femoral canal. The ceramic femoral head of the same size as the trial is placed and the hip is reduced with the same stability parameters.  The operative leg is again placed on top of the non-operative leg and the leg lengths are found to be equal.      The wound is then copiously irrigated with saline solution and the capsule and short external rotators are re-attached to the femur through drill holes with Ethibond suture. The fascia lata is closed over a hemovac drain with #1 vicryl suture and the fascia lata, gluteal muscles and subcutaneous tissues are injected with  Exparel 20ml diluted with saline 50ml. The subcutaneous tissues are closed with #1 and2-0 vicryl and the subcuticular layer closed with running 4-0 Monocryl. The drain is hooked to suction, incision cleaned and dried, and steri-srips and a bulky sterile dressing applied. The limb is placed into a knee immobilizer and the patient is awakened and transported to recovery in stable condition.      Please note that a surgical assistant was a medical necessity for this procedure in order to perform it in a safe and expeditious manner. The assistant was necessary to provide retraction to the vital neurovascular structures and to retract and position the limb to allow for anatomic placement of the prosthetic components.  Larry Arroyo Larry Paulding, MD    02/11/2011, 3:54 PM

## 2011-02-11 NOTE — H&P (View-Only) (Signed)
Larry Arroyo Fam  DOB: 1950-05-03  Date Of Admission: 02/11/2011  Chief Complaint:  Right Hip Pain  History of Present Illness The patient is a 60 year old male who comes in today for a preoperative History and Physical. The patient is scheduled for a right total hip arthroplasty to be performed by Dr. Gus Rankin. Aluisio, MD at [facility] on 02/11/2011.They have been seen for right hip and increasing pain and instability. Accompanied by his wife today.  Allergies No Known Drug Allergies.   Medications Ramipril (5MG  Capsule, Oral two times daily) Active. Coreg (3.125MG  Tablet, Oral two times daily) Active. Hydrochlorothiazide (25MG  Tablet, Oral daily) Active. Lipitor (40MG  Tablet, Oral daily) Active. Aspirin EC (81MG  Tablet DR, Oral daily) Active.  Problem List/Past Medical Hypertension Coronary Artery Disease/Heart Disease Hypercholesterolemia  Past Surgical History Total Hip Replacement - Left. Date: 04/1994. Cardiac Cath. Date: 01/2007.  Family History Father. Heart Failure  Social History Tobacco use. Never smoker. Alcohol use. Occasional alcohol use. 2 Beers Children. 1 Advance Directives. Living Will  Review of Systems General:Not Present- Chills, Fever, Night Sweats, Fatigue, Weight Gain, Weight Loss and Memory Loss. Skin:Not Present- Hives, Itching, Rash, Eczema and Lesions. HEENT:Not Present- Tinnitus, Headache, Double Vision, Visual Loss, Hearing Loss and Dentures. Respiratory:Not Present- Shortness of breath with exertion, Shortness of breath at rest, Allergies, Coughing up blood and Chronic Cough. Cardiovascular:Not Present- Chest Pain, Racing/skipping heartbeats, Difficulty Breathing Lying Down, Murmur, Swelling and Palpitations. Gastrointestinal:Not Present- Bloody Stool, Heartburn, Abdominal Pain, Vomiting, Nausea, Constipation, Diarrhea, Difficulty Swallowing, Jaundice and Loss of appetitie. Male Genitourinary:Not  Present- Urinary frequency, Blood in Urine, Weak urinary stream, Discharge, Flank Pain, Incontinence, Painful Urination, Urgency, Urinary Retention and Urinating at Night. Musculoskeletal:Not Present- Muscle Weakness, Muscle Pain, Joint Swelling, Joint Pain, Back Pain, Morning Stiffness and Spasms. Neurological:Not Present- Tremor, Dizziness, Blackout spells, Paralysis, Difficulty with balance and Weakness. Psychiatric:Not Present- Insomnia.  Vitals Weight: 165 lb Height: 70 in Weight was reported by patient. Height was reported by patient. Body Surface Area: 1.92 m Body Mass Index: 23.67 kg/m Pulse: 68 (Regular) Resp.: 12 (Unlabored) BP: 124/76 (Sitting, Left Arm, Standard)  Physical Exam The physical exam findings are as follows:  General Mental Status - Alert, cooperative and good historian. General Appearance- pleasant and Anxious (Mild). Not in acute distress. Orientation- Oriented X3. Build & Nutrition- Well nourished and Well developed. Mental Status- cooperative and good historian.  Head and Neck Head- normocephalic, atraumatic . Neck Global Assessment- supple. no bruit auscultated on the right and no bruit auscultated on the left.  Eye Pupil- Bilateral- PERRLA. Motion- Bilateral- EOMI.  Chest and Lung Exam Auscultation: Breath sounds:- clear at anterior chest wall and - clear at posterior chest wall. Adventitious sounds:- No Adventitious sounds.  Cardiovascular Auscultation:Rhythm- Regular rate and rhythm. Heart Sounds- S1 WNL and S2 WNL. Murmurs & Other Heart Sounds:Auscultation of the heart reveals - No Murmurs.  Abdomen Palpation/Percussion:Tenderness- Abdomen is non-tender to palpation. Rigidity (guarding)- Abdomen is soft. Auscultation:Auscultation of the abdomen reveals - Bowel sounds normal.  Male Genitourinary Note: Not done, not pertinent to present illness  Musculoskeletal Note: On physical exam a well  developed male, alert and oriented in no apparent distress. He walks with a significantly antalgic gait pattern on the right. His right hip can be flexed 90, no internal rotation, about 20 external rotation, 20 abduction. The left hip flexes to 110, rotate in 30, out 40, abduct 40. His strength is intact both lower extremities. Pulses and sensation are intact.  RADIOGRAPHS:  Radiographs were reviewed from 7/18, AP pelvis and lateral of the hip, and these were brought to [place] from his primary care office. These were supine radiographs, not standing. He had bone on bone change focally superior lateral on the right hip. There was impingement type morphology also.  Assessment & Plan Osteoarthritis Right Hip  Note: Plan is for a Right Total Hip Arthroplasty per Dr. Lequita Halt. Risks and benefits of the surgery have been discussed with the patient and they elect to proceed with surgery.  There are on active contraindications to upcoming procedure such as ongoing infection or progressive neurological disease.  Avel Peace, PA-C

## 2011-02-11 NOTE — Transfer of Care (Signed)
Immediate Anesthesia Transfer of Care Note  Patient: Larry Arroyo  Procedure(s) Performed:  TOTAL HIP ARTHROPLASTY  Patient Location: PACU  Anesthesia Type: Spinal  Level of Consciousness: sedated, patient cooperative and responds to stimulaton  Airway & Oxygen Therapy: Patient Spontanous Breathing and Patient connected to face mask oxgen  Post-op Assessment: Report given to PACU RN and Post -op Vital signs reviewed and stable  Post vital signs: Reviewed and stable  Complications: No apparent anesthesia complications

## 2011-02-12 DIAGNOSIS — M1611 Unilateral primary osteoarthritis, right hip: Secondary | ICD-10-CM | POA: Diagnosis present

## 2011-02-12 DIAGNOSIS — E871 Hypo-osmolality and hyponatremia: Secondary | ICD-10-CM | POA: Diagnosis not present

## 2011-02-12 LAB — CBC
HCT: 31.5 % — ABNORMAL LOW (ref 39.0–52.0)
MCHC: 32.7 g/dL (ref 30.0–36.0)
Platelets: 215 10*3/uL (ref 150–400)
RDW: 13.7 % (ref 11.5–15.5)
WBC: 9.2 10*3/uL (ref 4.0–10.5)

## 2011-02-12 LAB — BASIC METABOLIC PANEL
BUN: 7 mg/dL (ref 6–23)
Chloride: 100 mEq/L (ref 96–112)
GFR calc Af Amer: >90 mL/min (ref >90)
GFR calc non Af Amer: >90 mL/min (ref >90)
Potassium: 3.6 mEq/L (ref 3.5–5.1)

## 2011-02-12 NOTE — Progress Notes (Signed)
Subjective: 1 Day Post-Op Procedure(s) (LRB): TOTAL HIP ARTHROPLASTY (Right) Patient reports pain as mild and moderate.   Patient seen in rounds with Dr. Lequita Halt. Patient has complaints of soreness and pain.  No sleep last night will start with PT today  We will start therapy today. Plan is to go home after hospital stay.  Objective: Vital signs in last 24 hours: Temp:  [96.8 F (36 C)-98.4 F (36.9 C)] 97.5 F (36.4 C) (11/27 0200) Pulse Rate:  [65-104] 87  (11/27 0200) Resp:  [7-18] 16  (11/27 0200) BP: (104-170)/(66-107) 106/66 mmHg (11/27 0200) SpO2:  [96 %-100 %] 100 % (11/27 0200)  Intake/Output from previous day:  Intake/Output Summary (Last 24 hours) at 02/12/11 0713 Last data filed at 02/12/11 0530  Gross per 24 hour  Intake   3560 ml  Output   2280 ml  Net   1280 ml    Intake/Output this shift:    Labs: Results for orders placed during the hospital encounter of 02/11/11  TYPE AND SCREEN      Component Value Range   ABO/RH(D) O POS     Antibody Screen NEG     Sample Expiration 02/14/2011    ABO/RH      Component Value Range   ABO/RH(D) O POS    CBC      Component Value Range   WBC 9.2  4.0 - 10.5 (K/uL)   RBC 3.56 (*) 4.22 - 5.81 (MIL/uL)   Hemoglobin 10.3 (*) 13.0 - 17.0 (g/dL)   HCT 86.5 (*) 78.4 - 52.0 (%)   MCV 88.5  78.0 - 100.0 (fL)   MCH 28.9  26.0 - 34.0 (pg)   MCHC 32.7  30.0 - 36.0 (g/dL)   RDW 69.6  29.5 - 28.4 (%)   Platelets 215  150 - 400 (K/uL)  BASIC METABOLIC PANEL      Component Value Range   Sodium 133 (*) 135 - 145 (mEq/L)   Potassium 3.6  3.5 - 5.1 (mEq/L)   Chloride 100  96 - 112 (mEq/L)   CO2 27  19 - 32 (mEq/L)   Glucose, Bld 146 (*) 70 - 99 (mg/dL)   BUN 7  6 - 23 (mg/dL)   Creatinine, Ser 1.32  0.50 - 1.35 (mg/dL)   Calcium 8.7  8.4 - 44.0 (mg/dL)   GFR calc non Af Amer >90  >90 (mL/min)   GFR calc Af Amer >90  >90 (mL/min)    Exam - Neurovascular intact Sensation intact distally Intact pulses distally Dressing -  clean, dry Motor function intact - moving foot and toes well on exam.  Hemovac pulled without difficulty.  Assessment/Plan: 1 Day Post-Op Procedure(s) (LRB): TOTAL HIP ARTHROPLASTY (Right) Postop Hyponatremia, Mild  Past Medical History  Diagnosis Date  . Complication of anesthesia     16 yrs ago following hip replacement states took a long time for full memory to return  . Arthritis   . Hypertension     ADDENDUM- STRESS TEST 9/12 with LOV DR TURNER ON CHART  . Coronary artery disease     LOV Dr Mayford Knife 12/10/10 on chart/ Stress test  12/06/09 on chart, eccho 2008 on chart    Advance diet Up with therapy Discharge home with home health when met goals  DVT Prophylaxis - Xarelto  Protocol Partial-Weight Bearing 25-50% right Leg D/C Knee Immobilizer Hemovac Pulled Begin Therapy Hip Preacutions Keep foley until tomorrow. No vaccines.  Keiley Levey 02/12/2011, 7:13 AM

## 2011-02-12 NOTE — Progress Notes (Signed)
Physical Therapy Treatment Patient Details Name: Larry Arroyo MRN: [id number] DOB: 05/30/1950 Today's Date: 02/12/2011 13:55-14:22. Leonia Reeves, te  PT Assessment/Plan  PT - Assessment/Plan Comments on Treatment Session: Pt did well with gait this PM. He required cues to increase WB secondary to keeping TTWB for first few steps.  Pt able to recall 1/3 hip precautions I'ly (no crossing of legs). PT Plan: Discharge plan remains appropriate;Frequency remains appropriate PT Frequency: 7X/week Follow Up Recommendations: Home health PT;24 hour supervision/assistance Equipment Recommended: Rolling walker with 5" wheels PT Goals  Acute Rehab PT Goals PT Goal Formulation: With patient/family Time For Goal Achievement: 7 days Pt will go Supine/Side to Sit: with supervision PT Goal: Supine/Side to Sit - Progress: Progressing toward goal Pt will go Sit to Supine/Side: with supervision;with HOB 0 degrees PT Goal: Sit to Supine/Side - Progress: Progressing toward goal Pt will Transfer Sit to Stand/Stand to Sit: with supervision PT Transfer Goal: Sit to Stand/Stand to Sit - Progress: Progressing toward goal Pt will Go Up / Down Stairs: 6-9 stairs;with min assist;with least restrictive assistive device PT Goal: Up/Down Stairs - Progress: Other (comment) (not addressed) Pt will Perform Home Exercise Program: with supervision, verbal cues required/provided PT Goal: Perform Home Exercise Program - Progress: Progressing toward goal  PT Treatment Precautions/Restrictions  Precautions Precautions: Posterior Hip Precaution Booklet Issued: Yes (comment) Restrictions Weight Bearing Restrictions: Yes RLE Weight Bearing: Partial weight bearing RLE Partial Weight Bearing Percentage or Pounds: 25-50 Mobility (including Balance) Bed Mobility Bed Mobility: Yes  Sit to Supine - Right: 4: Min assist;With rail (A for R LE and verbal cues for L LE to assist) Sit to Supine - Right Details (indicate cue type and  reason): cues on how to use L LE to scoot hips Transfers Transfers: Yes Sit to Stand: 4: Min assist;From chair/3-in-1 Sit to Stand Details (indicate cue type and reason): VC to push from bed Stand to Sit: 4: Min assist Stand to Sit Details: cues to extend R LE Ambulation/Gait Ambulation/Gait: Yes Ambulation/Gait Assistance: 4: Min assist Ambulation/Gait Assistance Details (indicate cue type and reason): cues for PWB status Ambulation Distance (Feet): 32 Feet Assistive device: Rolling walker Gait Pattern: Step-to pattern Stairs: No Wheelchair Mobility Wheelchair Mobility: No  Posture/Postural Control Posture/Postural Control: No significant limitations Exercise  Total Joint Exercises Ankle Circles/Pumps: Right;10 reps;AROM Quad Sets: Strengthening;Right;10 reps;Supine Gluteal Sets: Strengthening;10 reps;Supine Heel Slides: AAROM;Right;10 reps;Supine Hip ABduction/ADduction: AAROM;Right;10 reps;Supine End of Session PT - End of Session Activity Tolerance: Patient tolerated treatment well Patient left: in bed;with call bell in reach;with family/visitor present Nurse Communication: Mobility status for transfers General Behavior During Session: [facility] for tasks performed Cognition: [facility] for tasks performed  [facility] LUBECK 02/12/2011, 2:31 PM

## 2011-02-12 NOTE — Plan of Care (Signed)
Problem: Consults Goal: Diagnosis- Total Joint Replacement Outcome: Progressing Primary Total Hip     

## 2011-02-12 NOTE — Progress Notes (Signed)
Physical Therapy Evaluation Patient Details Name: Larry Arroyo MRN: [id number] DOB: 1950-11-05 Today's Date: 02/12/2011  Problem List: There is no problem list on file for this patient. [id number] Ev2  Past Medical History:  Past Medical History  Diagnosis Date  . Complication of anesthesia     16 yrs ago following hip replacement states took a long time for full memory to return  . Arthritis   . Hypertension     ADDENDUM- STRESS TEST 9/12 with LOV DR TURNER ON CHART  . Coronary artery disease     LOV Dr Mayford Knife 12/10/10 on chart/ Stress test  12/06/09 on chart, eccho 2008 on chart   Past Surgical History:  Past Surgical History  Procedure Date  . Joint replacement     left hip  . Cardiac catheterization     2008    PT Assessment/Plan/Recommendation PT Assessment Clinical Impression Statement: pt S/P RTHA presents with decreased functional mobility, ROM, strength and can benefit from PT to improve function to DC to home PT Recommendation/Assessment: Patient will need skilled PT in the acute care venue PT Problem List: Decreased strength;Decreased range of motion;Decreased activity tolerance;Decreased mobility;Decreased knowledge of use of DME;Decreased knowledge of precautions;Pain PT Therapy Diagnosis : Difficulty walking;Acute pain PT Plan PT Frequency: 7X/week PT Treatment/Interventions: DME instruction;Gait training;Stair training;Functional mobility training;Patient/family education;Therapeutic exercise PT Recommendation Recommendations for Other Services: OT consult Follow Up Recommendations: Home health PT;24 hour supervision/assistance Equipment Recommended: Rolling walker with 5" wheels PT Goals  Acute Rehab PT Goals PT Goal Formulation: With patient/family Time For Goal Achievement: 7 days Pt will go Supine/Side to Sit: with supervision PT Goal: Supine/Side to Sit - Progress: Progressing toward goal Pt will go Sit to Supine/Side: with supervision;with HOB 0  degrees PT Goal: Sit to Supine/Side - Progress: Progressing toward goal Pt will Transfer Sit to Stand/Stand to Sit: with supervision PT Transfer Goal: Sit to Stand/Stand to Sit - Progress: Progressing toward goal Pt will Go Up / Down Stairs: 6-9 stairs;with min assist;with least restrictive assistive device PT Goal: Up/Down Stairs - Progress: Progressing toward goal Pt will Perform Home Exercise Program: with supervision, verbal cues required/provided PT Goal: Perform Home Exercise Program - Progress: Progressing toward goal  PT Evaluation Precautions/Restrictions  Precautions Precautions: Posterior Hip Restrictions Weight Bearing Restrictions: Yes RLE Weight Bearing: Partial weight bearing RLE Partial Weight Bearing Percentage or Pounds: 25-50% Prior Functioning  Home Living Lives With: Spouse Receives Help From: Family Type of Home: House Home Layout: One level Home Access: Stairs to enter Entrance Stairs-Rails: Doctor, general practice of Steps: 6 Bathroom Toilet: Standard Bathroom Accessibility: Yes How Accessible: Accessible via walker Home Adaptive Equipment Raised toilet seat with rails Prior Function Level of Independence: Independent with basic ADLs;Independent with gait;Independent with transfers;Independent with homemaking with ambulation Able to Take Stairs?: Yes Driving: Yes Cognition Cognition Arousal/Alertness: Awake/alert Overall Cognitive Status: Appears within functional limits for tasks assessed (pt does appear anxious due to impending pain and N/V) Orientation Level: Oriented X4 Sensation/Coordination Sensation Light Touch: Appears Intact Coordination Gross Motor Movements are Fluid and Coordinated: Yes Extremity Assessment RUE Assessment RUE Assessment: Within Functional Limits LUE Assessment LUE Assessment: Within Functional Limits RLE Assessment RLE Assessment: Exceptions to [facility] RLE AROM (degrees) RLE Overall AROM Comments: pt guards  RLE with attempts to flex, able to flex hip to 70 on EOB RLE Strength RLE Overall Strength Comments: pt able to flex hip to 50 in supine with min assist. able to advance in standing LLE Assessment LLE Assessment: Within  Functional Limits Mobility (including Balance) Bed Mobility Bed Mobility: Yes Supine to Sit: 3: Mod assist;With rails;HOB elevated (Comment degrees) (45) Supine to Sit Details (indicate cue type and reason): vc and support of RLE during turn on bed Transfers Transfers: Yes Sit to Stand: 4: Min assist;From bed;From elevated surface;With upper extremity assist Sit to Stand Details (indicate cue type and reason): VC to push from bed Stand to Sit: 3: Mod assist;To chair/3-in-1;With armrests Stand to Sit Details: vc and support of trunk to lower to chair, pt very rigid and guarding Ambulation/Gait Ambulation/Gait: Yes Ambulation/Gait Assistance: 3: Mod assist Ambulation/Gait Assistance Details (indicate cue type and reason): vc for PWB on RLE Ambulation Distance (Feet): 5 Feet Assistive device: Rolling walker Gait Pattern: Step-to pattern Stairs: No Wheelchair Mobility Wheelchair Mobility: No  Posture/Postural Control Posture/Postural Control: No significant limitations Exercise  Total Joint Exercises Ankle Circles/Pumps: Right;10 reps;AROM Heel Slides: AAROM;Right;10 reps;Supine (used sheet to assist with ROm) End of Session PT - End of Session Activity Tolerance: Patient limited by pain;Patient limited by fatigue Patient left: in chair;with family/visitor present;with call bell in reach Nurse Communication: Mobility status for transfers General Behavior During Session: [facility] for tasks performed (pt is very tense) Cognition: WFL for tasks performed  Rada Hay 02/12/2011, 1:16 [id number]

## 2011-02-12 NOTE — Progress Notes (Signed)
CM consult done. See CM notes in shadow chart.  Acadia Thammavong Wyche RN BSN CCM [phone] 02/12/2011    

## 2011-02-13 ENCOUNTER — Encounter (HOSPITAL_COMMUNITY): Payer: Self-pay | Admitting: Orthopedic Surgery

## 2011-02-13 LAB — BASIC METABOLIC PANEL
Chloride: 97 mEq/L (ref 96–112)
GFR calc Af Amer: >90 mL/min (ref >90)
GFR calc non Af Amer: >90 mL/min (ref >90)
Glucose, Bld: 122 mg/dL — ABNORMAL HIGH (ref 70–99)
Potassium: 3.9 mEq/L (ref 3.5–5.1)
Sodium: 132 mEq/L — ABNORMAL LOW (ref 135–145)

## 2011-02-13 LAB — CBC
Hemoglobin: 10.1 g/dL — ABNORMAL LOW (ref 13.0–17.0)
MCHC: 33.7 g/dL (ref 30.0–36.0)
RDW: 13.8 % (ref 11.5–15.5)
WBC: 10.3 10*3/uL (ref 4.0–10.5)

## 2011-02-13 MED ORDER — OXYCODONE HCL 5 MG PO TABS: 5.0000 mg | ORAL_TABLET | ORAL | Status: AC | PRN

## 2011-02-13 MED ORDER — RIVAROXABAN 10 MG PO TABS: 10.0000 mg | ORAL_TABLET | ORAL | Status: DC

## 2011-02-13 MED ORDER — METHOCARBAMOL 500 MG PO TABS: 500.0000 mg | ORAL_TABLET | Freq: Four times a day (QID) | ORAL | Status: AC | PRN

## 2011-02-13 NOTE — Progress Notes (Addendum)
OT Note Order received, chart reviewed, spoke briefly with pt who had previously had L hip sx. Pt has all necessary DME & AE @home . Pt will also have prn A from wife. No f/u OT needed. Will sign off.  Garrel Ridgel, OTR/L [phone]

## 2011-02-13 NOTE — Progress Notes (Signed)
Physical Therapy Treatment Patient Details Name: Larry Arroyo MRN: [id number] DOB: 1950/10/11 Today's Date: 02/13/2011  PT Assessment/Plan  PT - Assessment/Plan Comments on Treatment Session: Pt progressing with PT goals.  Motivated & pleasant.  Educated wife on how to (A) with LE exercises (heels slides/hip add/abd)   PT Plan: Discharge plan remains appropriate PT Frequency: 7X/week Follow Up Recommendations: Home health PT;24 hour supervision/assistance Equipment Recommended: Rolling walker with 5" wheels PT Goals  Acute Rehab PT Goals PT Goal: Supine/Side to Sit - Progress: Progressing toward goal PT Transfer Goal: Sit to Stand/Stand to Sit - Progress: Progressing toward goal PT Goal: Perform Home Exercise Program - Progress: Progressing toward goal  PT Treatment Precautions/Restrictions  Precautions Precautions: Posterior Hip Precaution Booklet Issued: Yes (comment) Precaution Comments: Pt recalled 3/3 hip precautions Independently.   Restrictions Weight Bearing Restrictions: Yes RLE Weight Bearing: Partial weight bearing RLE Partial Weight Bearing Percentage or Pounds: 25-50% Mobility (including Balance) Bed Mobility Supine to Sit: 4: Min assist;HOB flat;With rails Supine to Sit Details (indicate cue type and reason): (A) to move RLE to EOB/OOB; cues for sequencing, technique, use of UE's to increase ease of transition.   Transfers Sit to Stand: Other (comment);From bed;With upper extremity assist (Min guard assist) Sit to Stand Details (indicate cue type and reason): cues for safe hand placement & RLE positioning.   Stand to Sit: To chair/3-in-1;With upper extremity assist;With armrests Stand to Sit Details: cues for RLE positioning & technique Ambulation/Gait Ambulation/Gait Assistance: Other (comment) Tree surgeon) Ambulation/Gait Assistance Details (indicate cue type and reason): Cues for sequencing, RW advancement Ambulation Distance (Feet): 80 Feet Assistive  device: Rolling walker Gait Pattern: Step-to pattern    Exercise  Total Joint Exercises Ankle Circles/Pumps: AROM;Both;10 reps;Supine Quad Sets: Both;10 reps;Supine;Strengthening Gluteal Sets: Strengthening;Both;10 reps;Supine Hip ABduction/ADduction: AAROM;Both;10 reps;Supine;Strengthening LAQ's: strengthening; 10 reps; seated; Right Seated Marching; 10 reps; AROM  End of Session PT - End of Session Equipment Utilized During Treatment: Gait belt Activity Tolerance: Patient tolerated treatment well Patient left: in chair;with call bell in reach;with family/visitor present General Behavior During Session: Larry Arroyo for tasks performed Cognition: Larry Arroyo for tasks performed  Larry Arroyo 02/13/2011, 1:09 PM

## 2011-02-13 NOTE — Discharge Summary (Signed)
Physician Discharge Summary   Patient ID: Larry Arroyo MRN: [id number] DOB/AGE: 1950-06-15 60 y.o.  Admit date: 02/11/2011 Discharge date: 02/13/2011  Primary Diagnosis: Osteoarthritis Right Hip  Admission Diagnoses: Past Medical History  Diagnosis Date  . Complication of anesthesia     16 yrs ago following hip replacement states took a long time for full memory to return  . Arthritis   . Hypertension     ADDENDUM- STRESS TEST 9/12 with LOV DR TURNER ON CHART  . Coronary artery disease     LOV Dr Mayford Knife 12/10/10 on chart/ Stress test  12/06/09 on chart, eccho 2008 on chart    Discharge Diagnoses:  Principal Problem:  *Osteoarthritis of right hip Active Problems:  Hyponatremia   Procedure: Procedure(s) (LRB): TOTAL HIP ARTHROPLASTY (Right)   Consults: none  HPI: Larry Arroyo is a 60 y.o. male with end stage arthritis of his right hip with progressively worsening pain and dysfunction. Pain occurs with activity and rest including pain at night. He has tried analgesics, protected weight bearing and rest without benefit. Pain is too severe to attempt physical therapy. Radiographs demonstrate bone on bone arthritis with subchondral cyst formation. He presents now for right THA   Laboratory Data: Results for orders placed during the hospital encounter of 02/11/11  TYPE AND SCREEN      Component Value Range   ABO/RH(D) O POS     Antibody Screen NEG     Sample Expiration 02/14/2011    ABO/RH      Component Value Range   ABO/RH(D) O POS    CBC      Component Value Range   WBC 9.2  4.0 - 10.5 (K/uL)   RBC 3.56 (*) 4.22 - 5.81 (MIL/uL)   Hemoglobin 10.3 (*) 13.0 - 17.0 (g/dL)   HCT 40.9 (*) 81.1 - 52.0 (%)   MCV 88.5  78.0 - 100.0 (fL)   MCH 28.9  26.0 - 34.0 (pg)   MCHC 32.7  30.0 - 36.0 (g/dL)   RDW 91.4  78.2 - 95.6 (%)   Platelets 215  150 - 400 (K/uL)  BASIC METABOLIC PANEL      Component Value Range   Sodium 133 (*) 135 - 145 (mEq/L)   Potassium 3.6  3.5 -  5.1 (mEq/L)   Chloride 100  96 - 112 (mEq/L)   CO2 27  19 - 32 (mEq/L)   Glucose, Bld 146 (*) 70 - 99 (mg/dL)   BUN 7  6 - 23 (mg/dL)   Creatinine, Ser 2.13  0.50 - 1.35 (mg/dL)   Calcium 8.7  8.4 - 08.6 (mg/dL)   GFR calc non Af Amer >90  >90 (mL/min)   GFR calc Af Amer >90  >90 (mL/min)  CBC      Component Value Range   WBC 10.3  4.0 - 10.5 (K/uL)   RBC 3.46 (*) 4.22 - 5.81 (MIL/uL)   Hemoglobin 10.1 (*) 13.0 - 17.0 (g/dL)   HCT 57.8 (*) 46.9 - 52.0 (%)   MCV 86.7  78.0 - 100.0 (fL)   MCH 29.2  26.0 - 34.0 (pg)   MCHC 33.7  30.0 - 36.0 (g/dL)   RDW 62.9  52.8 - 41.3 (%)   Platelets 183  150 - 400 (K/uL)  BASIC METABOLIC PANEL      Component Value Range   Sodium 132 (*) 135 - 145 (mEq/L)   Potassium 3.9  3.5 - 5.1 (mEq/L)   Chloride 97  96 - 112 (  mEq/L)   CO2 26  19 - 32 (mEq/L)   Glucose, Bld 122 (*) 70 - 99 (mg/dL)   BUN 7  6 - 23 (mg/dL)   Creatinine, Ser 7.82  0.50 - 1.35 (mg/dL)   Calcium 9.1  8.4 - 95.6 (mg/dL)   GFR calc non Af Amer >90  >90 (mL/min)   GFR calc Af Amer >90  >90 (mL/min)   Hospital Outpatient Visit on 02/01/2011  Component Date Value Range Status  . aPTT (seconds) 02/01/2011 36  24-37 Final  . WBC (K/uL) 02/01/2011 7.6  4.0-10.5 Final  . RBC (MIL/uL) 02/01/2011 4.65  4.22-5.81 Final  . Hemoglobin (g/dL) 21/30/8657 84.6  96.2-95.2 Final  . HCT (%) 02/01/2011 41.0  39.0-52.0 Final  . MCV (fL) 02/01/2011 88.2  78.0-100.0 Final  . MCH (pg) 02/01/2011 29.7  26.0-34.0 Final  . MCHC (g/dL) 84/13/2440 10.2  72.5-36.6 Final  . RDW (%) 02/01/2011 14.3  11.5-15.5 Final  . Platelets (K/uL) 02/01/2011 277  150-400 Final  . Sodium (mEq/L) 02/01/2011 135  135-145 Final  . Potassium (mEq/L) 02/01/2011 4.7  3.5-5.1 Final  . Chloride (mEq/L) 02/01/2011 97  96-112 Final  . CO2 (mEq/L) 02/01/2011 31  19-32 Final  . Glucose, Bld (mg/dL) 44/05/4740 88  59-56 Final  . BUN (mg/dL) 38/75/6433 15  2-95 Final  . Creatinine, Ser (mg/dL) 18/84/1660 6.30  1.60-1.09 Final    . Calcium (mg/dL) 32/35/5732 20.2  5.4-27.0 Final  . Total Protein (g/dL) 62/37/6283 7.5  1.5-1.7 Final  . Albumin (g/dL) 61/60/7371 4.0  0.6-2.6 Final  . AST (U/L) 02/01/2011 19  0-37 Final  . ALT (U/L) 02/01/2011 24  0-53 Final  . Alkaline Phosphatase (U/L) 02/01/2011 93  39-117 Final  . Total Bilirubin (mg/dL) 94/85/4627 0.3  0.3-5.0 Final  . GFR calc non Af Amer (mL/min) 02/01/2011 79* >90 Final  . GFR calc Af Amer (mL/min) 02/01/2011 >90  >90 Final   Comment:                                 The eGFR has been calculated                          using the CKD EPI equation.                          This calculation has not been                          validated in all clinical                          situations.                          eGFR's persistently                          <90 mL/min signify                          possible Chronic Kidney Disease.  Marland Kitchen Prothrombin Time (seconds) 02/01/2011 13.6  11.6-15.2 Final  . INR  02/01/2011 1.02  0.00-1.49 Final  . Color, Urine  02/01/2011 YELLOW  YELLOW Final  . Appearance  02/01/2011 CLEAR  CLEAR Final  . Specific Gravity, Urine  02/01/2011 1.014  1.005-1.030 Final  . pH  02/01/2011 5.5  5.0-8.0 Final  . Glucose, UA (mg/dL) 72/53/6644 NEGATIVE  NEGATIVE Final  . Hgb urine dipstick  02/01/2011 NEGATIVE  NEGATIVE Final  . Bilirubin Urine  02/01/2011 NEGATIVE  NEGATIVE Final  . Ketones, ur (mg/dL) 03/47/4259 NEGATIVE  NEGATIVE Final  . Protein, ur (mg/dL) 56/38/7564 NEGATIVE  NEGATIVE Final  . Urobilinogen, UA (mg/dL) 33/29/5188 0.2  4.1-6.6 Final  . Nitrite  02/01/2011 NEGATIVE  NEGATIVE Final  . Leukocytes, UA  02/01/2011 NEGATIVE  NEGATIVE Final   MICROSCOPIC NOT DONE ON URINES WITH NEGATIVE PROTEIN, BLOOD, LEUKOCYTES, NITRITE, OR GLUCOSE <1000 mg/dL.  Marland Kitchen MRSA, PCR  02/01/2011 NEGATIVE  NEGATIVE Final  . Staphylococcus aureus  02/01/2011 NEGATIVE  NEGATIVE Final   Comment:                                 The Xpert SA Assay (FDA                           approved for NASAL specimens                          only), is one component of                          a comprehensive surveillance                          program.  It is not intended                          to diagnose infection nor to                          guide or monitor treatment.    X-Rays:Dg Chest 2 View  02/01/2011  *RADIOLOGY REPORT*  Clinical Data: Preop for right hip replacement surgery.  CHEST - 2 VIEW  Comparison: Chest x-ray 02/02/2007.  Findings: The cardiac silhouette, mediastinal and hilar contours are within normal limits.  The lungs are clear.  No pleural effusions.  The bony thorax is intact.  IMPRESSION: Normal chest x-ray.  No change since prior study.  Original Report Authenticated By: P. Loralie Champagne, M.D.   Ap Pelvis Hip  02/11/2011  *RADIOLOGY REPORT*  Clinical Data: Post right hip replacement  PELVIS - 1-2 VIEW  Comparison: 02/01/2011  Findings: Acetabular and femoral components of a right hip prosthesis newly identified. No acute fracture or dislocation. Minimal osseous demineralization. Stable left hip prosthesis without dislocation on single AP view. Expected soft tissue postsurgical changes and surgical drain at right hip region. Visualized portions of pelvis appear intact.  IMPRESSION: Right hip prosthesis without acute complication.  Original Report Authenticated By: Lollie Marrow, M.D.   Dg Hip Complete Right  02/01/2011  *RADIOLOGY REPORT*  Clinical Data: Preop for right hip replacement surgery.  RIGHT HIP - COMPLETE 2+ VIEW  Comparison: None  Findings: A left hip prosthesis is noted.  The right hip demonstrates moderate degenerative changes along with mild developmental dysplasia.  No findings for avascular necrosis.  The pubic symphysis and SI joints are intact.  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  Original Report Authenticated By: P. Loralie Champagne, M.D.    EKG: Orders placed  during the hospital encounter of 02/01/11  . EKG 12-LEAD  . EKG 12-LEAD     Hospital Course: Patient was admitted to [facility] and taken to the OR and underwent the above state procedure without complications.  Patient tolerated the procedure well and was later transferred to the recovery room and then to the orthopaedic floor for postoperative care.  They were given PO and IV analgesics for pain control following their surgery.  They were given 24 hours of postoperative antibiotics and started on DVT prophylaxis.   PT and OT were ordered for total joint protocol.  Discharge planning consulted to help with postop disposition and equipment needs.  Patient had a decent night on the evening of surgery and started to get up with therapy on day one. Started on PO and IV meds. Hemovac drain was pulled without difficulty.  Continued to progress with therapy into day two.  Dressing was changed on day two and the incision was looked great.  By the end of day two, the patient had progressed with therapy and meeting goals.  Incision was healing well.  Patient was seen in rounds and was ready to go home.    Discharge Medications: Prior to Admission medications   Medication Sig Start Date End Date Taking? Authorizing Provider  atorvastatin (LIPITOR) 80 MG tablet Take 40 mg by mouth at bedtime.    Yes Historical Provider, MD  carvedilol (COREG) 3.125 MG tablet Take 3.125 mg by mouth 2 (two) times daily with a meal.    Yes Historical Provider, MD  hydrochlorothiazide (HYDRODIURIL) 25 MG tablet Take 25 mg by mouth daily.    Yes Historical Provider, MD  ramipril (ALTACE) 5 MG capsule Take 5 mg by mouth 2 (two) times daily.    Yes Historical Provider, MD  tetrahydrozoline-zinc (VISINE-AC) 0.05-0.25 % ophthalmic solution Place 2 drops into both eyes 3 (three) times daily as needed. reddnes   Yes Historical Provider, MD  methocarbamol (ROBAXIN) 500 MG tablet Take 1 tablet (500 mg total) by mouth every 6  (six) hours as needed. 02/13/11 02/23/11  Jaidan Prevette, PA  oxyCODONE (OXY IR/ROXICODONE) 5 MG immediate release tablet Take 1-2 tablets (5-10 mg total) by mouth every 4 (four) hours as needed for pain. 02/13/11 02/23/11  Venessa Wickham Julien Girt, PA  rivaroxaban (XARELTO) 10 MG TABS tablet Take 1 tablet (10 mg total) by mouth daily. 02/13/11   Macio Kissoon Julien Girt, PA    Diet: regular  Activity:PWB No bending hip over 90 degrees- A "L" Angle Do not cross legs Do not let foot roll inward  When turning these patients a pillow should be placed between the patient's legs to prevent crossing.  Patients should have the affected knee fully extended when trying to sit or stand from all surfaces to prevent excessive hip flexion.  When ambulating and turning toward the affected side the affected leg should have the toes turned out prior to moving the walker and the rest of patient's body as to prevent internal rotation/ turning in of the leg.  Abduction pillows are the most effective way to prevent a patient from not crossing legs or turning toes in at rest. If an abduction pillow is not ordered placing a regular pillow length wise between the patient's legs is also an effective reminder.  It is imperative that these precautions be maintained so that the surgical hip  does not dislocate.    Follow-up:in 2 weeks  Disposition: Home  Discharged Condition: good   Discharge Orders    Future Orders Please Complete By Expires   Diet - low sodium heart healthy      Call MD / Call 911      Comments:   If you experience chest pain or shortness of breath, CALL 911 and be transported to the hospital emergency room.  If you develope a fever above 101 F, pus (white drainage) or increased drainage or redness at the wound, or calf pain, call your surgeon's office.   Constipation Prevention      Comments:   Drink plenty of fluids.  Prune juice may be helpful.  You may use a stool softener, such as Colace  (over the counter) 100 mg twice a day.  Use MiraLax (over the counter) for constipation as needed.   Increase activity slowly as tolerated      Weight Bearing as taught in Physical Therapy      Comments:   Use a walker or crutches as instructed.   Discharge instructions      Comments:   Pick up stool softner and laxative for home. Do not submerge incision under water. May shower. Hip precautions.  Total Hip Protocol.   Driving restrictions      Comments:   No driving   Lifting restrictions      Comments:   No lifting   Follow the hip precautions as taught in Physical Therapy      Change dressing      Comments:   You may change daily with sterile 4 x 4 inch gauze dressing and paper tape.   TED hose      Comments:   Use stockings (TED hose) for 2 weeks on both leg(s).  You may remove them at night for sleeping.     Current Discharge Medication List    START taking these medications   Details  methocarbamol (ROBAXIN) 500 MG tablet Take 1 tablet (500 mg total) by mouth every 6 (six) hours as needed. Qty: 80 tablet, Refills: 0    oxyCODONE (OXY IR/ROXICODONE) 5 MG immediate release tablet Take 1-2 tablets (5-10 mg total) by mouth every 4 (four) hours as needed for pain. Qty: 80 tablet, Refills: 0    rivaroxaban (XARELTO) 10 MG TABS tablet Take 1 tablet (10 mg total) by mouth daily. Qty: 19 tablet, Refills: 0      CONTINUE these medications which have NOT CHANGED   Details  atorvastatin (LIPITOR) 80 MG tablet Take 40 mg by mouth at bedtime.     carvedilol (COREG) 3.125 MG tablet Take 3.125 mg by mouth 2 (two) times daily with a meal.     hydrochlorothiazide (HYDRODIURIL) 25 MG tablet Take 25 mg by mouth daily.     ramipril (ALTACE) 5 MG capsule Take 5 mg by mouth 2 (two) times daily.     tetrahydrozoline-zinc (VISINE-AC) 0.05-0.25 % ophthalmic solution Place 2 drops into both eyes 3 (three) times daily as needed. reddnes      STOP taking these medications     aspirin  EC 81 MG tablet      HYDROcodone-acetaminophen (NORCO) 10-325 MG per tablet        Follow-up Information    Follow up with ALUISIO,FRANK V. Make an appointment in 2 weeks.   Contact information:   [facility] [address] [place] [address] [phone]  SignedPatrica Duel 02/13/2011, 2:51 PM

## 2011-02-13 NOTE — Progress Notes (Signed)
Physical Therapy Treatment Patient Details Name: Larry Arroyo MRN: [id number] DOB: 09/16/1950 Today's Date: 02/13/2011  PT Assessment/Plan  PT - Assessment/Plan Comments on Treatment Session: Plans are for pt to d/c home later today.  Focus of therapy session was stair training- pt did well with up/down stairs using rail & crutch.  Pt states he has a pair of crutches at home from [facility].   PT Plan: Discharge plan remains appropriate PT Frequency: 7X/week Follow Up Recommendations: Home health PT;24 hour supervision/assistance Equipment Recommended: Rolling walker with 5" wheels PT Goals  Acute Rehab PT Goals PT Goal: Supine/Side to Sit - Progress: Progressing toward goal PT Transfer Goal: Sit to Stand/Stand to Sit - Progress: Progressing toward goal PT Goal: Up/Down Stairs - Progress: Met PT Goal: Perform Home Exercise Program - Progress: Progressing toward goal  PT Treatment Precautions/Restrictions  Precautions Precautions: Posterior Hip Precaution Booklet Issued: Yes (comment) Precaution Comments: Pt recalled 3/3 hip precautions Independently.   Restrictions Weight Bearing Restrictions: Yes RLE Weight Bearing: Partial weight bearing RLE Partial Weight Bearing Percentage or Pounds: 25-50% Mobility (including Balance) Bed Mobility Bed Mobility: No (Pt in recliner before & after session) Supine to Sit: 4: Min assist;HOB flat;With rails Transfers Sit to Stand: 5: Supervision Sit to Stand Details (indicate cue type and reason): supervision for safety due to decreased balance/stability but no physical (A) needed.   Stand to Sit: 5: Supervision Stand to Sit Details: Supervision for safety due to decreased balance/stability Ambulation/Gait Ambulation/Gait: No Ambulation/Gait Assistance Details (indicate cue type and reason): Ambulation deferred per pt request  due to wanting to conserve energy/strength for D/C home later today.  Pt ambulated ~80' this AM with min guard assist.    Stairs: Yes Stairs Assistance: Other (comment) (Min Guard (A); cues for sequencing & technique) Stair Management Technique: One rail Left;With crutches;Step to pattern;Forwards (Rail on right/Crutch on left) Number of Stairs: 3  (2x's)    End of Session PT - End of Session Equipment Utilized During Treatment: Gait belt Activity Tolerance: Patient tolerated treatment well Patient left: in chair;with call bell in reach General Behavior During Session: [facility] for tasks performed Cognition: [facility] for tasks performed  Lara Mulch 02/13/2011, 3:19 PM

## 2012-09-10 ENCOUNTER — Other Ambulatory Visit: Payer: Self-pay | Admitting: Family Medicine

## 2012-09-10 ENCOUNTER — Ambulatory Visit
Admission: RE | Admit: 2012-09-10 | Discharge: 2012-09-10 | Disposition: A | Discharge status: Home / Self Care | Payer: PRIVATE HEALTH INSURANCE | Source: Ambulatory Visit | Attending: Family Medicine | Admitting: Family Medicine

## 2012-09-10 DIAGNOSIS — R0602 Shortness of breath: Secondary | ICD-10-CM

## 2012-09-10 DIAGNOSIS — R7989 Other specified abnormal findings of blood chemistry: Secondary | ICD-10-CM

## 2012-09-10 DIAGNOSIS — R079 Chest pain, unspecified: Secondary | ICD-10-CM

## 2012-09-10 MED ORDER — IOHEXOL 350 MG/ML SOLN
125.0000 mL | Freq: Once | INTRAVENOUS | Status: AC | PRN
  Administered 2012-09-10: 125 mL via INTRAVENOUS

## 2012-09-14 ENCOUNTER — Other Ambulatory Visit: Payer: Self-pay | Admitting: Family Medicine

## 2012-09-14 DIAGNOSIS — R935 Abnormal findings on diagnostic imaging of other abdominal regions, including retroperitoneum: Secondary | ICD-10-CM

## 2012-09-15 ENCOUNTER — Ambulatory Visit
Admission: RE | Admit: 2012-09-15 | Discharge: 2012-09-15 | Disposition: A | Discharge status: Home / Self Care | Payer: PRIVATE HEALTH INSURANCE | Source: Ambulatory Visit | Attending: Family Medicine | Admitting: Family Medicine

## 2012-09-15 DIAGNOSIS — R935 Abnormal findings on diagnostic imaging of other abdominal regions, including retroperitoneum: Secondary | ICD-10-CM

## 2012-09-15 MED ORDER — GADOXETATE DISODIUM 0.25 MMOL/ML IV SOLN
10.0000 mL | Freq: Once | INTRAVENOUS | Status: AC | PRN
  Administered 2012-09-15: 10 mL via INTRAVENOUS

## 2012-12-27 ENCOUNTER — Encounter: Payer: Self-pay | Admitting: Cardiology

## 2012-12-27 ENCOUNTER — Encounter: Payer: Self-pay | Admitting: *Deleted

## 2012-12-31 ENCOUNTER — Ambulatory Visit: Payer: PRIVATE HEALTH INSURANCE | Admitting: Cardiology

## 2013-03-24 ENCOUNTER — Encounter: Payer: Self-pay | Admitting: Cardiology

## 2013-03-24 ENCOUNTER — Encounter: Payer: Self-pay | Admitting: General Surgery

## 2013-03-24 ENCOUNTER — Ambulatory Visit (INDEPENDENT_AMBULATORY_CARE_PROVIDER_SITE_OTHER): Payer: PRIVATE HEALTH INSURANCE | Admitting: Cardiology

## 2013-03-24 VITALS — BP 190/118 | HR 100 | Ht 70.0 in | Wt 178.8 lb

## 2013-03-24 DIAGNOSIS — G4710 Hypersomnia, unspecified: Secondary | ICD-10-CM

## 2013-03-24 DIAGNOSIS — I2510 Atherosclerotic heart disease of native coronary artery without angina pectoris: Secondary | ICD-10-CM

## 2013-03-24 DIAGNOSIS — R079 Chest pain, unspecified: Secondary | ICD-10-CM

## 2013-03-24 DIAGNOSIS — I10 Essential (primary) hypertension: Secondary | ICD-10-CM

## 2013-03-24 DIAGNOSIS — E7800 Pure hypercholesterolemia, unspecified: Secondary | ICD-10-CM

## 2013-03-24 DIAGNOSIS — I25119 Atherosclerotic heart disease of native coronary artery with unspecified angina pectoris: Secondary | ICD-10-CM | POA: Insufficient documentation

## 2013-03-24 LAB — LIPID PANEL
Cholesterol: 233 mg/dL — ABNORMAL HIGH (ref 0–200)
HDL: 55.7 mg/dL (ref >39.00)
Total CHOL/HDL Ratio: 4
Triglycerides: 78 mg/dL (ref 0.0–149.0)
VLDL: 15.6 mg/dL (ref 0.0–40.0)

## 2013-03-24 LAB — LDL CHOLESTEROL, DIRECT: LDL DIRECT: 147.2 mg/dL

## 2013-03-24 LAB — ALT: ALT: 50 U/L (ref 0–53)

## 2013-03-24 MED ORDER — CARVEDILOL 6.25 MG PO TABS
6.2500 mg | ORAL_TABLET | Freq: Two times a day (BID) | ORAL | Status: DC
Start: 2013-03-24 — End: 2013-03-30

## 2013-03-24 MED ORDER — ASPIRIN EC 81 MG PO TBEC: 81.0000 mg | DELAYED_RELEASE_TABLET | Freq: Every day | ORAL | Status: AC

## 2013-03-24 NOTE — Progress Notes (Signed)
[place], [address], [place]  [address] Phone: [phone] Fax:  [phone]  Date:  03/24/2013   ID:  BRYNDON Arroyo, DOB 21-Feb-1951, MRN [id number]  PCP:  Marcello Fennel, MD  Cardiologist:  Fransico Him, MD     History of Present Illness: Larry Arroyo is a 63 y.o. male with a history of ASCAD, HTN and dyslipidemia.  He is doing well.  He denies any LE edema, dizziness, palpitations or syncope.  He says that he had an episode of chest discomfort in July after coming back from the beach and had an EKG, chest CT angio.  He says that since then he has had episodic chest pain that he describes as a tightness over his left breast that resolves with rest and never lasts more than 5 minutes.  He has also noticed DOE.  His BP has been very erratic with high and low readings.  He has not had much energy as well and is very lethargic all day.  He does not know if he snores.  He feels tired when he wakes up in the am over the past several months.     Wt Readings from Last 3 Encounters:  03/24/13 178 lb 12.8 oz (81.103 kg)  02/13/11 170 lb (77.111 kg)  02/13/11 170 lb (77.111 kg)     Past Medical History  Diagnosis Date  . Complication of anesthesia     16 yrs ago following hip replacement states took a long time for full memory to return  . Arthritis   . Hypertension     ADDENDUM- STRESS TEST 9/12 with LOV DR TURNER ON CHART  . Coronary artery disease     LOV Dr Radford Pax 12/10/10 on chart/ Stress test  12/06/09 on chart, eccho 2008 on chart  . Hypercholesteremia   . Anxiety   . Depression   . Sleep disturbance     Current Outpatient Prescriptions  Medication Sig Dispense Refill  . atorvastatin (LIPITOR) 80 MG tablet Take 40 mg by mouth at bedtime.       . carvedilol (COREG) 3.125 MG tablet Take 3.125 mg by mouth 2 (two) times daily with a meal.       . hydrochlorothiazide (HYDRODIURIL) 25 MG tablet Take 25 mg by mouth daily.       . ramipril (ALTACE) 5 MG capsule Take 5 mg  by mouth 2 (two) times daily.       . rivaroxaban (XARELTO) 10 MG TABS tablet Take 1 tablet (10 mg total) by mouth daily.  19 tablet  0  . tetrahydrozoline-zinc (VISINE-AC) 0.05-0.25 % ophthalmic solution Place 2 drops into both eyes 3 (three) times daily as needed. reddnes       No current facility-administered medications for this visit.    Allergies:   No Known Allergies  Social History:  The patient  reports that he has never smoked. He has never used smokeless tobacco. He reports that he drinks about 7.2 ounces of alcohol per week. He reports that he uses illicit drugs.   Family History:  The patient's family history is not on file.   ROS:  Please see the history of present illness.      All other systems reviewed and negative.   PHYSICAL EXAM: VS:  BP 190/118  Pulse 100  Ht 5\' 10"  (1.778 m)  Wt 178 lb 12.8 oz (81.103 kg)  BMI 25.66 kg/m2 Well nourished, well developed, in no acute distress HEENT: normal Neck:  no JVD Cardiac:  normal S1, S2; RRR; no murmur Lungs:  clear to auscultation bilaterally, no wheezing, rhonchi or rales Abd: soft, nontender, no hepatomegaly Ext: no edema Skin: warm and dry Neuro:  CNs 2-12 intact, no focal abnormalities noted     ASSESSMENT AND PLAN:  1. ASCAD with with episodes of chest pain and SOB with exertional fatigue  - restart ASA 81mg  daily  - Lexiscan CL to rule out ischemia - given his markedly elevated BP I do not want to walk him on the treadmill  - 2D echo to eval LVF 2. HTN - poorly controlled  - continue HCTZ/Altace/Coreg  - increase Coreg to 6.25mg  BID 3. Dyslipidemia  - continue Pravastatin  - check fasting lipids and ALT today 4.  Hypersomnia  - Sleep study  Followup with my nurse in 1 week and bring his BP cuff to correlate Followup with me in 6 months   Signed, Fransico Him, MD 03/24/2013 9:08 AM

## 2013-03-24 NOTE — Patient Instructions (Addendum)
Your physician has recommended you make the following change in your medication:  1. Restart Aspirin 81 MG daily 2. Increase Coreg to 6.25 Mg Twice a day (this Rx has been sent into your pharmacy)  Your physician recommends that you go to the lab today for Fasting Lipid and ALT Panel  Your physician has recommended that you have a sleep study. This test records several body functions during sleep, including: brain activity, eye movement, oxygen and carbon dioxide blood levels, heart rate and rhythm, breathing rate and rhythm, the flow of air through your mouth and nose, snoring, body muscle movements, and chest and belly movement. (schedule at [facility])  Your physician has requested that you have a lexiscan myoview. For further information please visit HugeFiesta.[email or web address]. Please follow instruction sheet, as given.  Your physician has requested that you have an echocardiogram. Echocardiography is a painless test that uses sound waves to create images of your heart. It provides your doctor with information about the size and shape of your heart and how well your heart's chambers and valves are working. This procedure takes approximately one hour. There are no restrictions for this procedure.   Your physician recommends that you schedule a follow-up appointment in: one week for a Nurse visit for BP check. (Please bring your BP cuff)  Your physician wants you to follow-up in: 6 Months with Dr. Mallie Snooks will receive a reminder letter in the mail two months in advance. If you don't receive a letter, please call our office to schedule the follow-up appointment.

## 2013-03-25 ENCOUNTER — Other Ambulatory Visit: Payer: Self-pay

## 2013-03-25 MED ORDER — RAMIPRIL 5 MG PO CAPS: 5.0000 mg | ORAL_CAPSULE | Freq: Two times a day (BID) | ORAL | Status: DC

## 2013-03-29 ENCOUNTER — Other Ambulatory Visit: Payer: Self-pay | Admitting: General Surgery

## 2013-03-29 ENCOUNTER — Encounter: Payer: Self-pay | Admitting: General Surgery

## 2013-03-29 ENCOUNTER — Telehealth: Payer: Self-pay | Admitting: General Surgery

## 2013-03-29 DIAGNOSIS — I2510 Atherosclerotic heart disease of native coronary artery without angina pectoris: Secondary | ICD-10-CM

## 2013-03-29 MED ORDER — PRAVASTATIN SODIUM 80 MG PO TABS
80.0000 mg | ORAL_TABLET | Freq: Every day | ORAL | Status: DC
Start: 2013-03-29 — End: 2013-07-30

## 2013-03-29 MED ORDER — PSYLLIUM 28.3 % PO POWD: ORAL | Status: DC

## 2013-03-29 NOTE — Telephone Encounter (Signed)
Please have him come in for Troponin and CPK MB and find out if he checked his BP during the episode.  Also I would like him to have a BP check when he comes in for labs

## 2013-03-29 NOTE — Telephone Encounter (Signed)
Pt stated he was glad that I called that he had a incident on Friday 03/26/13. He described it as numbness to his left shoulder up to his jaw. He stated it last two hours. He did not take any medications to help, it eased off by its self. Pt denied any SOB and CP. He did say that he did have some dizziness. Pt was concerned. His usual spells only lasted 5-10 minutes.  He is set up for his stress test on 1/15 and his echo on 1/21. To Dr Radford Pax to make aware. He is also ok with starting the metamucil will add to pts med list.

## 2013-03-29 NOTE — Telephone Encounter (Signed)
Pt stated he will be in at 9:00 am tomorrow on 03/30/13 for BP check and labs.

## 2013-03-29 NOTE — Telephone Encounter (Signed)
Message copied by Lily Kocher on Mon Mar 29, 2013  9:48 AM ------      Message from: SMART, Maralyn Sago      Created: Fri Mar 26, 2013  4:59 PM       RF: CAD, HTN, age - LDL goal < 70       Meds: Pravastatin 80 mg qd, fish oil 1 g/d.       Intolerant: Lipitor (muscle aches), Simvastatin (leg aches). Can't afford Crestor or Zetia.      Baseline LDL ~ 200 mg/dL off meds.      He had LDL down to ~ 118 mg/dL in the past on pravastatin 40 mg qd, however LDL up to 147 on pravastatin 80 mg qd now.        Since he can't afford Zetia, Welchol, Crestor will have him continue pravastatin and add viscous fiber to regimen.      Plan:      1.  Add Metamucil twice daily prior to meals.  Use Metamucil powder and mix 1 tablespoon into 8 ounces of water or juice and drink - do this twice daily.      2.  Continue all other meds.      3.  Recheck lipid panel and hepatic panel in 4 months.      Please notify patient, update meds (remove Lipitor and add pravastatin 80 mg to med list, and add metamucil bid), and set up labs. Thanks. ------

## 2013-03-30 ENCOUNTER — Telehealth: Payer: Self-pay | Admitting: General Surgery

## 2013-03-30 ENCOUNTER — Ambulatory Visit (INDEPENDENT_AMBULATORY_CARE_PROVIDER_SITE_OTHER): Payer: 59

## 2013-03-30 ENCOUNTER — Other Ambulatory Visit: Payer: 59

## 2013-03-30 VITALS — BP 110/88 | HR 101 | Ht 70.0 in | Wt 185.0 lb

## 2013-03-30 DIAGNOSIS — I2510 Atherosclerotic heart disease of native coronary artery without angina pectoris: Secondary | ICD-10-CM

## 2013-03-30 LAB — CK TOTAL AND CKMB (NOT AT ARMC)
CK, MB: 1 ng/mL (ref 0.3–4.0)
Total CK: 54 U/L (ref 7–232)

## 2013-03-30 LAB — TROPONIN I: Troponin I: <0.3 ng/mL (ref <0.30)

## 2013-03-30 MED ORDER — CARVEDILOL 12.5 MG PO TABS: 12.5000 mg | ORAL_TABLET | Freq: Two times a day (BID) | ORAL | Status: DC

## 2013-03-30 NOTE — Telephone Encounter (Signed)
Please let patient know labs were fine for heart

## 2013-03-30 NOTE — Progress Notes (Signed)
**Note De-Identified Larry Arroyo Obfuscation** The pt arrives in office for a BP check per Dr Radford Pax.  At last OV with Dr Radford Pax on 03/24/13 the pt was advised to restart Asa 81 mg daily, to increase Coreg to 6.25 mg BID and to have this BP check in 1 week. On 03/26/13 the pt states that he had numbness in his left shoulder that radiated to his jaw and chest tightness. He states that this episode lasted for about 2 hours and has not happened since. Per Dr Radford Pax the pt is advised to increase Coreg to 12.5 mg BID and to have an OV with Dr Radford Pax in 1 week. RX sent to [facility] on [address]. to fill and f/u with Dr Radford Pax scheduled for 1/20 at 1:30, pt verbalized understanding and agreed with plan.

## 2013-03-30 NOTE — Telephone Encounter (Signed)
[facility] called to give stat labs   Troponin less then 0.30 within range CPK 54 Reference range 7-232 Ckmb 1.0 within range 0.3-4.0  To Dr Radford Pax to make aware.

## 2013-03-31 NOTE — Telephone Encounter (Signed)
Pt is aware.  

## 2013-04-01 ENCOUNTER — Ambulatory Visit (HOSPITAL_COMMUNITY): Payer: 59 | Attending: Cardiology | Admitting: Radiology

## 2013-04-01 ENCOUNTER — Encounter (HOSPITAL_COMMUNITY): Payer: PRIVATE HEALTH INSURANCE

## 2013-04-01 ENCOUNTER — Encounter: Payer: Self-pay | Admitting: Cardiology

## 2013-04-01 VITALS — BP 121/90 | HR 82 | Ht 70.0 in | Wt 182.0 lb

## 2013-04-01 DIAGNOSIS — R0609 Other forms of dyspnea: Secondary | ICD-10-CM | POA: Insufficient documentation

## 2013-04-01 DIAGNOSIS — I2510 Atherosclerotic heart disease of native coronary artery without angina pectoris: Secondary | ICD-10-CM | POA: Insufficient documentation

## 2013-04-01 DIAGNOSIS — R0989 Other specified symptoms and signs involving the circulatory and respiratory systems: Secondary | ICD-10-CM | POA: Insufficient documentation

## 2013-04-01 DIAGNOSIS — R42 Dizziness and giddiness: Secondary | ICD-10-CM | POA: Insufficient documentation

## 2013-04-01 DIAGNOSIS — E785 Hyperlipidemia, unspecified: Secondary | ICD-10-CM | POA: Insufficient documentation

## 2013-04-01 DIAGNOSIS — R079 Chest pain, unspecified: Secondary | ICD-10-CM | POA: Insufficient documentation

## 2013-04-01 DIAGNOSIS — R002 Palpitations: Secondary | ICD-10-CM | POA: Insufficient documentation

## 2013-04-01 DIAGNOSIS — I10 Essential (primary) hypertension: Secondary | ICD-10-CM | POA: Insufficient documentation

## 2013-04-01 MED ORDER — TECHNETIUM TC 99M SESTAMIBI GENERIC - CARDIOLITE
11.0000 | Freq: Once | INTRAVENOUS | Status: AC | PRN
  Administered 2013-04-01: 11 via INTRAVENOUS

## 2013-04-01 MED ORDER — TECHNETIUM TC 99M SESTAMIBI GENERIC - CARDIOLITE
33.0000 | Freq: Once | INTRAVENOUS | Status: AC | PRN
  Administered 2013-04-01: 33 via INTRAVENOUS

## 2013-04-01 MED ORDER — REGADENOSON 0.4 MG/5ML IV SOLN
0.4000 mg | Freq: Once | INTRAVENOUS | Status: AC
  Administered 2013-04-01: 0.4 mg via INTRAVENOUS

## 2013-04-01 NOTE — Progress Notes (Signed)
[facility] [address] [place], [place] [address] [phone]    Cardiology Nuclear Med Study  Larry Arroyo is a 63 y.o. male     MRN : [id number]     DOB: 07-08-1950  Procedure Date: 04/01/2013  Nuclear Med Background Indication for Stress Test:  Evaluation for Ischemia History:  CAD, Cath 2008 (n/o CAD), MPI 2012 (normal), Cardiac CT -PE, CAD, 1.6cm lesion left lobe Cardiac Risk Factors: Hypertension and Lipids  Symptoms:  Chest Pain, DOE, Light-Headedness and Palpitations   Nuclear Pre-Procedure Caffeine/Decaff Intake:  None NPO After: 9:00pm   Lungs:  clear O2 Sat: 98% on room air. IV 0.9% NS with Angio Cath:  22g  IV Site: R Hand  IV Started by:  Crissie Figures, RN  Chest Size (in):  46 Cup Size: n/a  Height: 5\' 10"  (1.778 m)  Weight:  182 lb (82.555 kg)  BMI:  Body mass index is 26.11 kg/(m^2). Tech Comments:  N/A    Nuclear Med Study 1 or 2 day study: 1 day  Stress Test Type:  Lexiscan  Reading MD: N/A  Order Authorizing Provider:  Fransico Him, MD  Resting Radionuclide: Technetium 58m Sestamibi  Resting Radionuclide Dose: 11.0 mCi   Stress Radionuclide:  Technetium 61m Sestamibi  Stress Radionuclide Dose: 33.0 mCi           Stress Protocol Rest HR: 82 115  Rest BP: 121/90 Stress BP: 96/63  Exercise Time (min): n/a METS: n/a           Dose of Adenosine (mg):  n/a Dose of Lexiscan: 0.4 mg  Dose of Atropine (mg): n/a Dose of Dobutamine: n/a mcg/kg/min (at max HR)  Stress Test Technologist: Glade Lloyd, BS-ES  Nuclear Technologist:  Charlton Amor, CNMT     Rest Procedure:  Myocardial perfusion imaging was performed at rest 45 minutes following the intravenous administration of Technetium 89m Sestamibi. Rest ECG: NSR, rate 82bpm, poor R wave progression.   Stress Procedure:  The patient received IV Lexiscan 0.4 mg over 15-seconds with concurrent low level exercise and then Technetium 6m Sestamibi was injected at  30-seconds while the patient continued walking one more minute.  Quantitative spect images were obtained after a 45-minute delay. Stress ECG: No significant change from baseline ECG  QPS Raw Data Images:  Normal; no motion artifact; normal heart/lung ratio. Stress Images:  Normal homogeneous uptake in all areas of the myocardium. Rest Images:  Normal homogeneous uptake in all areas of the myocardium. Subtraction (SDS):  Normal Transient Ischemic Dilatation (Normal <1.22):  0.90 Lung/Heart Ratio (Normal <0.45):  0.32  Quantitative Gated Spect Images QGS EDV:  83 ml QGS ESV:  37 ml  Impression Exercise Capacity:  Lexiscan with low level exercise. BP Response:  Normal blood pressure response. Clinical Symptoms:  No significant symptoms noted. ECG Impression:  No significant ST segment change suggestive of ischemia. Comparison with Prior Nuclear Study: No images to compare  Overall Impression:  Low risk stress nuclear study no ischemia..  LV Ejection Fraction: 56%.  LV Wall Motion:  NL LV Function; NL Wall Motion

## 2013-04-06 ENCOUNTER — Encounter: Payer: Self-pay | Admitting: General Surgery

## 2013-04-06 ENCOUNTER — Encounter: Payer: Self-pay | Admitting: Cardiology

## 2013-04-06 ENCOUNTER — Ambulatory Visit (INDEPENDENT_AMBULATORY_CARE_PROVIDER_SITE_OTHER): Payer: PRIVATE HEALTH INSURANCE | Admitting: Cardiology

## 2013-04-06 VITALS — BP 109/81 | HR 104 | Ht 70.0 in | Wt 183.8 lb

## 2013-04-06 DIAGNOSIS — I10 Essential (primary) hypertension: Secondary | ICD-10-CM

## 2013-04-06 DIAGNOSIS — R079 Chest pain, unspecified: Secondary | ICD-10-CM

## 2013-04-06 DIAGNOSIS — E7800 Pure hypercholesterolemia, unspecified: Secondary | ICD-10-CM

## 2013-04-06 DIAGNOSIS — I2510 Atherosclerotic heart disease of native coronary artery without angina pectoris: Secondary | ICD-10-CM

## 2013-04-06 LAB — BASIC METABOLIC PANEL
BUN: 25 mg/dL — ABNORMAL HIGH (ref 6–23)
CHLORIDE: 99 meq/L (ref 96–112)
CO2: 29 meq/L (ref 19–32)
Calcium: 9.5 mg/dL (ref 8.4–10.5)
Creatinine, Ser: 1.1 mg/dL (ref 0.4–1.5)
GFR: 70.23 mL/min (ref >60.00)
Glucose, Bld: 99 mg/dL (ref 70–99)
POTASSIUM: 3.9 meq/L (ref 3.5–5.1)
Sodium: 136 mEq/L (ref 135–145)

## 2013-04-06 LAB — CBC WITH DIFFERENTIAL/PLATELET
BASOS PCT: 0.4 % (ref 0.0–3.0)
Basophils Absolute: 0 10*3/uL (ref 0.0–0.1)
EOS ABS: 0.4 10*3/uL (ref 0.0–0.7)
EOS PCT: 5.1 % — AB (ref 0.0–5.0)
HCT: 41.2 % (ref 39.0–52.0)
Hemoglobin: 13.9 g/dL (ref 13.0–17.0)
Lymphocytes Relative: 18.5 % (ref 12.0–46.0)
Lymphs Abs: 1.3 10*3/uL (ref 0.7–4.0)
MCHC: 33.8 g/dL (ref 30.0–36.0)
MCV: 87.7 fl (ref 78.0–100.0)
Monocytes Absolute: 0.7 10*3/uL (ref 0.1–1.0)
Monocytes Relative: 10 % (ref 3.0–12.0)
Neutro Abs: 4.5 10*3/uL (ref 1.4–7.7)
Neutrophils Relative %: 66 % (ref 43.0–77.0)
Platelets: 239 10*3/uL (ref 150.0–400.0)
RBC: 4.7 Mil/uL (ref 4.22–5.81)
RDW: 13.6 % (ref 11.5–14.6)
WBC: 6.9 10*3/uL (ref 4.5–10.5)

## 2013-04-06 LAB — PROTIME-INR
INR: 1.1 ratio — AB (ref 0.8–1.0)
Prothrombin Time: 11.4 s (ref 10.2–12.4)

## 2013-04-06 NOTE — Progress Notes (Signed)
[place], [name], [place]  [address] Phone: [phone] Fax:  [phone]  Date:  04/06/2013   ID:  Larry CLOSSER, DOB 1950/08/02, MRN [id number]  PCP:  Marcello Fennel, MD  Cardiologist:  Fransico Him, MD     History of Present Illness: Larry Arroyo is a 63 y.o. male with a history of ASCAD, HTN and dyslipidemia. He is doing well. He denies any LE edema, dizziness, palpitations or syncope. When I saw him a few weeks ago he said that he had an episode of chest discomfort in July after coming back from the beach and had an EKG, chest CT angio. He said at the last OV that since then he has had episodic chest pain that he described as a tightness over his left breast that resolves with rest and never lasts more than 5 minutes. He has also noticed.  He had another episode on 1/13 with some numbness in his left shoulder that radiated into his jaw with some chest tightness.  This lasted about 2 hours and resolved.  He says that when he his lying in bed inhaling he will feel the chest discomfort.  It can also occur with exertion.  He describes it as a muscular tightness and is always in this same spot.  It feels like a tingling sensation.  He also says that he is having dizzy spells when he goes from sitting to standing after increasing Coreg dose.     Wt Readings from Last 3 Encounters:  04/06/13 183 lb 12.8 oz (83.371 kg)  04/01/13 182 lb (82.555 kg)  03/30/13 185 lb (83.915 kg)     Past Medical History  Diagnosis Date  . Complication of anesthesia     16 yrs ago following hip replacement states took a long time for full memory to return  . Arthritis   . Anxiety   . Depression   . Sleep disturbance   . Coronary artery disease     nonobstructive ASCAD with 40-50% RCA, 60% D1, 70% inferior branche of D1  . Hypertension     ADDENDUM- STRESS TEST 9/12 with LOV DR Camdin Hegner ON CHART  . Hypercholesteremia     Current Outpatient Prescriptions  Medication Sig Dispense  Refill  . aspirin EC 81 MG tablet Take 1 tablet (81 mg total) by mouth daily.      . carvedilol (COREG) 12.5 MG tablet Take 1 tablet (12.5 mg total) by mouth 2 (two) times daily with a meal.  60 tablet  11  . citalopram (CELEXA) 40 MG tablet Take 40 mg by mouth daily.       . hydrochlorothiazide (HYDRODIURIL) 25 MG tablet Take 25 mg by mouth daily.       . pravastatin (PRAVACHOL) 80 MG tablet Take 1 tablet (80 mg total) by mouth daily.      . Psyllium (METAMUCIL) 28.3 % POWD One scoop added to 8 oz of juice or water BID before meals      . ramipril (ALTACE) 5 MG capsule Take 1 capsule (5 mg total) by mouth 2 (two) times daily.  180 capsule  3  . tetrahydrozoline-zinc (VISINE-AC) 0.05-0.25 % ophthalmic solution Place 2 drops into both eyes 3 (three) times daily as needed. reddnes       No current facility-administered medications for this visit.    Allergies:   No Known Allergies  Social History:  The patient  reports that he has never smoked. He has never used  smokeless tobacco. He reports that he drinks about 7.2 ounces of alcohol per week. He reports that he uses illicit drugs.   Family History:  The patient's family history includes Epilepsy (age of onset: 39) in his brother.   ROS:  Please see the history of present illness.      All other systems reviewed and negative.   PHYSICAL EXAM: VS:  BP 109/81  Pulse 104  Ht 5' 10" (1.778 m)  Wt 183 lb 12.8 oz (83.371 kg)  BMI 26.37 kg/m2 Well nourished, well developed, in no acute distress HEENT: normal Neck: no JVD Cardiac:  normal S1, S2; RRR; no murmur Lungs:  clear to auscultation bilaterally, no wheezing, rhonchi or rales Abd: soft, nontender, no hepatomegaly Ext: no edema Skin: warm and dry Neuro:  CNs 2-12 intact, no focal abnormalities noted       ASSESSMENT AND PLAN:  1. Atypical chest pain with normal nuclear stress test with known nonobstructive ASCAD by cath several years ago  - continue ASA  - Since he continues to  have Chest pain despite normal nuclear stress test with known nonbstructive ASCAD by cath several years ago- recommend proceeding with cardiac cath to reassess coronary anatomy. 2. HTN - controlled  - continue Coreg/Altace  - stop HCTZ since BP is borderline low 3. Dyslipidemia  - continue pravastatin  Signed, Ertha Nabor, MD 04/06/2013 1:55 PM  

## 2013-04-06 NOTE — Patient Instructions (Addendum)
Your physician has recommended you make the following change in your medication: Stop HCTZ  Your physician has requested that you have a cardiac catheterization. Cardiac catheterization is used to diagnose and/or treat various heart conditions. Doctors may recommend this procedure for a number of different reasons. The most common reason is to evaluate chest pain. Chest pain can be a symptom of coronary artery disease (CAD), and cardiac catheterization can show whether plaque is narrowing or blocking your heart's arteries. This procedure is also used to evaluate the valves, as well as measure the blood flow and oxygen levels in different parts of your heart. For further information please visit HugeFiesta.[email or web address]. Please follow instruction sheet, as given. ( You are scheduled for 04/13/13 at 7:30 am with Dr Radford Pax)  Your physician recommends that you go to the lab today for a BMET, CBC w diff, and PT/INR

## 2013-04-07 ENCOUNTER — Ambulatory Visit (HOSPITAL_COMMUNITY): Payer: 59 | Attending: Cardiology | Admitting: Radiology

## 2013-04-07 ENCOUNTER — Other Ambulatory Visit: Payer: Self-pay

## 2013-04-07 ENCOUNTER — Encounter (HOSPITAL_COMMUNITY): Payer: Self-pay | Admitting: Pharmacy Technician

## 2013-04-07 DIAGNOSIS — I2510 Atherosclerotic heart disease of native coronary artery without angina pectoris: Secondary | ICD-10-CM

## 2013-04-07 DIAGNOSIS — R072 Precordial pain: Secondary | ICD-10-CM

## 2013-04-07 DIAGNOSIS — R079 Chest pain, unspecified: Secondary | ICD-10-CM

## 2013-04-07 DIAGNOSIS — E785 Hyperlipidemia, unspecified: Secondary | ICD-10-CM | POA: Insufficient documentation

## 2013-04-07 DIAGNOSIS — I10 Essential (primary) hypertension: Secondary | ICD-10-CM | POA: Insufficient documentation

## 2013-04-07 NOTE — Progress Notes (Signed)
Echocardiogram performed.  

## 2013-04-08 ENCOUNTER — Other Ambulatory Visit: Payer: Self-pay | Admitting: General Surgery

## 2013-04-08 DIAGNOSIS — I77810 Thoracic aortic ectasia: Secondary | ICD-10-CM

## 2013-04-13 ENCOUNTER — Ambulatory Visit (HOSPITAL_COMMUNITY)
Admission: RE | Admit: 2013-04-13 | Discharge: 2013-04-14 | Disposition: A | Discharge status: Home / Self Care | Payer: 59 | Source: Ambulatory Visit | Attending: Cardiology | Admitting: Cardiology

## 2013-04-13 ENCOUNTER — Encounter (HOSPITAL_COMMUNITY)
Admission: RE | Disposition: A | Discharge status: Home / Self Care | Payer: 59 | Source: Ambulatory Visit | Attending: Cardiology

## 2013-04-13 DIAGNOSIS — I10 Essential (primary) hypertension: Secondary | ICD-10-CM | POA: Insufficient documentation

## 2013-04-13 DIAGNOSIS — E7800 Pure hypercholesterolemia, unspecified: Secondary | ICD-10-CM

## 2013-04-13 DIAGNOSIS — I2510 Atherosclerotic heart disease of native coronary artery without angina pectoris: Secondary | ICD-10-CM

## 2013-04-13 DIAGNOSIS — R079 Chest pain, unspecified: Secondary | ICD-10-CM

## 2013-04-13 DIAGNOSIS — G479 Sleep disorder, unspecified: Secondary | ICD-10-CM | POA: Insufficient documentation

## 2013-04-13 DIAGNOSIS — E785 Hyperlipidemia, unspecified: Secondary | ICD-10-CM | POA: Insufficient documentation

## 2013-04-13 DIAGNOSIS — G4710 Hypersomnia, unspecified: Secondary | ICD-10-CM

## 2013-04-13 DIAGNOSIS — F329 Major depressive disorder, single episode, unspecified: Secondary | ICD-10-CM | POA: Insufficient documentation

## 2013-04-13 DIAGNOSIS — I209 Angina pectoris, unspecified: Secondary | ICD-10-CM

## 2013-04-13 DIAGNOSIS — F3289 Other specified depressive episodes: Secondary | ICD-10-CM | POA: Insufficient documentation

## 2013-04-13 DIAGNOSIS — F411 Generalized anxiety disorder: Secondary | ICD-10-CM | POA: Insufficient documentation

## 2013-04-13 DIAGNOSIS — M129 Arthropathy, unspecified: Secondary | ICD-10-CM | POA: Insufficient documentation

## 2013-04-13 DIAGNOSIS — Z23 Encounter for immunization: Secondary | ICD-10-CM | POA: Insufficient documentation

## 2013-04-13 HISTORY — DX: Acute myocardial infarction, unspecified: I21.9

## 2013-04-13 HISTORY — PX: LEFT HEART CATHETERIZATION WITH CORONARY ANGIOGRAM: SHX5451

## 2013-04-13 LAB — POCT ACTIVATED CLOTTING TIME: ACTIVATED CLOTTING TIME: 459 s

## 2013-04-13 SURGERY — LEFT HEART CATHETERIZATION WITH CORONARY ANGIOGRAM
Anesthesia: LOCAL

## 2013-04-13 MED ORDER — FENTANYL CITRATE 0.05 MG/ML IJ SOLN
INTRAMUSCULAR | Status: AC
  Filled 2013-04-13: qty 2

## 2013-04-13 MED ORDER — ACETAMINOPHEN 325 MG PO TABS: 650.0000 mg | ORAL_TABLET | ORAL | Status: DC | PRN

## 2013-04-13 MED ORDER — ADENOSINE 12 MG/4ML IV SOLN
16.0000 mL | Freq: Once | INTRAVENOUS | Status: DC
  Filled 2013-04-13: qty 16

## 2013-04-13 MED ORDER — ASPIRIN 81 MG PO CHEW: 81.0000 mg | CHEWABLE_TABLET | ORAL | Status: DC

## 2013-04-13 MED ORDER — MIDAZOLAM HCL 2 MG/2ML IJ SOLN
INTRAMUSCULAR | Status: AC
  Filled 2013-04-13: qty 2

## 2013-04-13 MED ORDER — CARVEDILOL 12.5 MG PO TABS
12.5000 mg | ORAL_TABLET | Freq: Two times a day (BID) | ORAL | Status: DC
  Administered 2013-04-13 – 2013-04-14 (×2): 12.5 mg via ORAL
  Filled 2013-04-13 (×3): qty 1

## 2013-04-13 MED ORDER — HEPARIN (PORCINE) IN NACL 2-0.9 UNIT/ML-% IJ SOLN
INTRAMUSCULAR | Status: AC
  Filled 2013-04-13: qty 1000

## 2013-04-13 MED ORDER — LIDOCAINE HCL (PF) 1 % IJ SOLN
INTRAMUSCULAR | Status: AC
  Filled 2013-04-13: qty 30

## 2013-04-13 MED ORDER — LIDOCAINE-EPINEPHRINE 1 %-1:100000 IJ SOLN
INTRAMUSCULAR | Status: AC
  Filled 2013-04-13: qty 1

## 2013-04-13 MED ORDER — TICAGRELOR 90 MG PO TABS
ORAL_TABLET | ORAL | Status: AC
  Filled 2013-04-13: qty 2

## 2013-04-13 MED ORDER — SIMVASTATIN 40 MG PO TABS
40.0000 mg | ORAL_TABLET | Freq: Every day | ORAL | Status: DC
  Administered 2013-04-13: 19:00:00 40 mg via ORAL
  Filled 2013-04-13 (×2): qty 1

## 2013-04-13 MED ORDER — NITROGLYCERIN 0.2 MG/ML ON CALL CATH LAB
INTRAVENOUS | Status: AC
  Filled 2013-04-13: qty 1

## 2013-04-13 MED ORDER — PSYLLIUM 95 % PO PACK
1.0000 | PACK | Freq: Two times a day (BID) | ORAL | Status: DC
  Administered 2013-04-13: 1 via ORAL
  Filled 2013-04-13 (×3): qty 1

## 2013-04-13 MED ORDER — TICAGRELOR 90 MG PO TABS
90.0000 mg | ORAL_TABLET | Freq: Two times a day (BID) | ORAL | Status: DC
  Administered 2013-04-13 – 2013-04-14 (×2): 90 mg via ORAL
  Filled 2013-04-13 (×4): qty 1

## 2013-04-13 MED ORDER — HEPARIN SODIUM (PORCINE) 1000 UNIT/ML IJ SOLN
INTRAMUSCULAR | Status: AC
  Filled 2013-04-13: qty 1

## 2013-04-13 MED ORDER — SODIUM CHLORIDE 0.9 % IV SOLN
INTRAVENOUS | Status: DC
  Administered 2013-04-13: 07:00:00 via INTRAVENOUS

## 2013-04-13 MED ORDER — ZOLPIDEM TARTRATE 5 MG PO TABS
10.0000 mg | ORAL_TABLET | Freq: Every evening | ORAL | Status: DC | PRN
  Administered 2013-04-13: 10 mg via ORAL
  Filled 2013-04-13: qty 2

## 2013-04-13 MED ORDER — CITALOPRAM HYDROBROMIDE 40 MG PO TABS
40.0000 mg | ORAL_TABLET | Freq: Every day | ORAL | Status: DC
  Administered 2013-04-14: 09:00:00 40 mg via ORAL
  Filled 2013-04-13: qty 1

## 2013-04-13 MED ORDER — SODIUM CHLORIDE 0.9 % IV SOLN: 1.0000 mL/kg/h | INTRAVENOUS | Status: AC

## 2013-04-13 MED ORDER — SODIUM CHLORIDE 0.9 % IJ SOLN: 3.0000 mL | INTRAMUSCULAR | Status: DC | PRN

## 2013-04-13 MED ORDER — BIVALIRUDIN 250 MG IV SOLR
INTRAVENOUS | Status: AC
  Filled 2013-04-13: qty 250

## 2013-04-13 MED ORDER — ONDANSETRON HCL 4 MG/2ML IJ SOLN: 4.0000 mg | Freq: Four times a day (QID) | INTRAMUSCULAR | Status: DC | PRN

## 2013-04-13 MED ORDER — ASPIRIN EC 81 MG PO TBEC
81.0000 mg | DELAYED_RELEASE_TABLET | Freq: Every day | ORAL | Status: DC
  Administered 2013-04-14: 09:00:00 81 mg via ORAL
  Filled 2013-04-13: qty 1

## 2013-04-13 MED ORDER — SODIUM CHLORIDE 0.9 % IJ SOLN: 3.0000 mL | Freq: Two times a day (BID) | INTRAMUSCULAR | Status: DC

## 2013-04-13 MED ORDER — MORPHINE SULFATE 2 MG/ML IJ SOLN: 2.0000 mg | INTRAMUSCULAR | Status: DC | PRN

## 2013-04-13 MED ORDER — CEFAZOLIN SODIUM 1-5 GM-% IV SOLN
1.0000 g | Freq: Once | INTRAVENOUS | Status: AC
Start: 2013-04-13 — End: 2013-04-13
  Filled 2013-04-13: qty 50

## 2013-04-13 MED ORDER — RAMIPRIL 5 MG PO CAPS
5.0000 mg | ORAL_CAPSULE | Freq: Two times a day (BID) | ORAL | Status: DC
  Administered 2013-04-13 – 2013-04-14 (×2): 5 mg via ORAL
  Filled 2013-04-13 (×3): qty 1

## 2013-04-13 MED ORDER — SODIUM CHLORIDE 0.9 % IV SOLN
250.0000 mL | INTRAVENOUS | Status: DC | PRN
Start: 2013-04-13 — End: 2013-04-13

## 2013-04-13 NOTE — H&P (View-Only) (Signed)
[place], [name], [place]  [address] Phone: [phone] Fax:  [phone]  Date:  04/06/2013   ID:  Larry Arroyo, DOB 1950/08/02, MRN [id number]  PCP:  Marcello Fennel, MD  Cardiologist:  Fransico Him, MD     History of Present Illness: Larry Arroyo is a 63 y.o. male with a history of ASCAD, HTN and dyslipidemia. He is doing well. He denies any LE edema, dizziness, palpitations or syncope. When I saw him a few weeks ago he said that he had an episode of chest discomfort in July after coming back from the beach and had an EKG, chest CT angio. He said at the last OV that since then he has had episodic chest pain that he described as a tightness over his left breast that resolves with rest and never lasts more than 5 minutes. He has also noticed.  He had another episode on 1/13 with some numbness in his left shoulder that radiated into his jaw with some chest tightness.  This lasted about 2 hours and resolved.  He says that when he his lying in bed inhaling he will feel the chest discomfort.  It can also occur with exertion.  He describes it as a muscular tightness and is always in this same spot.  It feels like a tingling sensation.  He also says that he is having dizzy spells when he goes from sitting to standing after increasing Coreg dose.     Wt Readings from Last 3 Encounters:  04/06/13 183 lb 12.8 oz (83.371 kg)  04/01/13 182 lb (82.555 kg)  03/30/13 185 lb (83.915 kg)     Past Medical History  Diagnosis Date  . Complication of anesthesia     16 yrs ago following hip replacement states took a long time for full memory to return  . Arthritis   . Anxiety   . Depression   . Sleep disturbance   . Coronary artery disease     nonobstructive ASCAD with 40-50% RCA, 60% D1, 70% inferior branche of D1  . Hypertension     ADDENDUM- STRESS TEST 9/12 with LOV DR Tamarah Bhullar ON CHART  . Hypercholesteremia     Current Outpatient Prescriptions  Medication Sig Dispense  Refill  . aspirin EC 81 MG tablet Take 1 tablet (81 mg total) by mouth daily.      . carvedilol (COREG) 12.5 MG tablet Take 1 tablet (12.5 mg total) by mouth 2 (two) times daily with a meal.  60 tablet  11  . citalopram (CELEXA) 40 MG tablet Take 40 mg by mouth daily.       . hydrochlorothiazide (HYDRODIURIL) 25 MG tablet Take 25 mg by mouth daily.       . pravastatin (PRAVACHOL) 80 MG tablet Take 1 tablet (80 mg total) by mouth daily.      . Psyllium (METAMUCIL) 28.3 % POWD One scoop added to 8 oz of juice or water BID before meals      . ramipril (ALTACE) 5 MG capsule Take 1 capsule (5 mg total) by mouth 2 (two) times daily.  180 capsule  3  . tetrahydrozoline-zinc (VISINE-AC) 0.05-0.25 % ophthalmic solution Place 2 drops into both eyes 3 (three) times daily as needed. reddnes       No current facility-administered medications for this visit.    Allergies:   No Known Allergies  Social History:  The patient  reports that he has never smoked. He has never used  smokeless tobacco. He reports that he drinks about 7.2 ounces of alcohol per week. He reports that he uses illicit drugs.   Family History:  The patient's family history includes Epilepsy (age of onset: 67) in his brother.   ROS:  Please see the history of present illness.      All other systems reviewed and negative.   PHYSICAL EXAM: VS:  BP 109/81  Pulse 104  Ht 5\' 10"  (1.778 m)  Wt 183 lb 12.8 oz (83.371 kg)  BMI 26.37 kg/m2 Well nourished, well developed, in no acute distress HEENT: normal Neck: no JVD Cardiac:  normal S1, S2; RRR; no murmur Lungs:  clear to auscultation bilaterally, no wheezing, rhonchi or rales Abd: soft, nontender, no hepatomegaly Ext: no edema Skin: warm and dry Neuro:  CNs 2-12 intact, no focal abnormalities noted       ASSESSMENT AND PLAN:  1. Atypical chest pain with normal nuclear stress test with known nonobstructive ASCAD by cath several years ago  - continue ASA  - Since he continues to  have Chest pain despite normal nuclear stress test with known nonbstructive ASCAD by cath several years ago- recommend proceeding with cardiac cath to reassess coronary anatomy. 2. HTN - controlled  - continue Coreg/Altace  - stop HCTZ since BP is borderline low 3. Dyslipidemia  - continue pravastatin  Signed, Fransico Him, MD 04/06/2013 1:55 PM

## 2013-04-13 NOTE — Interval H&P Note (Signed)
History and Physical Interval Note:  04/13/2013 7:40 AM  Larry Arroyo  has presented today for surgery, with the diagnosis of Chest pain  The various methods of treatment have been discussed with the patient and family. After consideration of risks, benefits and other options for treatment, the patient has consented to  Procedure(s): LEFT HEART CATHETERIZATION WITH CORONARY ANGIOGRAM (N/A) as a surgical intervention .  The patient's history has been reviewed, patient examined, no change in status, stable for surgery.  I have reviewed the patient's chart and labs.  Questions were answered to the patient's satisfaction.     TURNER,TRACI R

## 2013-04-13 NOTE — CV Procedure (Signed)
    CARDIAC CATH NOTE  Name: Larry Arroyo MRN: [id number] DOB: 1951-02-12  Procedure: PTCA and stenting of the proximal LAD. FFR and IVUS guidance.  Indication: 63 yo WM with class 3 angina on medical therapy. Prior myoview study was low risk. On diagnostic angiography there was a 50-70% stenosis at the takeoff of the diagonal. FFR evaluation was recommended.  Procedural Details: The right groin was prepped, draped, and anesthetized with 1% lidocaine. A 6 Fr sheath was exchanged for the diagnostic sheath in the right femoral artery.  Weight-based bivalirudin was given for anticoagulation. Once a therapeutic ACT was achieved, a 6 [place] XBLAD guide catheter was inserted.  A Flow wire was used to cross the lesion and maximal hyperemia was obtained with IV Adenosine. FFR was 0.76 consistent with a hemodynamically significant lesion. Next a prowater coronary guidewire was used to cross the lesion. We then performed IVUS of the LAD. This demonstrated extensive plaque in the proximal to mid LAD with 3 focal areas of more severe stenosis.  The lesion was then stented with a 3.0 x 38 mm Alpine stent.  The stent was postdilated with a 3.5 mm noncompliant balloon. Repeat IVUS showed good stent expansion except for focal areas in the distal and proximal stent. These areas were then postdilated with a 3.75 mm noncompliant balloon.  Following PCI, there was 0% residual stenosis and TIMI-3 flow. Final angiography confirmed an excellent result. The patient tolerated the procedure well. There were no immediate procedural complications. Femoral hemostasis was achieved with an Angioseal device. The patient was transferred to the post catheterization recovery area for further monitoring.  Lesion Data: Vessel: LAD Percent stenosis (pre): 70% with abnormal FFR. TIMI-flow (pre):  3 Stent:  3.0 x 38 mm Alpine Percent stenosis (post): 0% TIMI-flow (post): 0%  Conclusions:  1. Abnormal FFR and IVUS assessment of the  proximal LAD 2. Successful stenting of the proximal LAD with a DES.  Recommendations: Continue dual antiplatelet therapy for one year.  Collier Salina [facility] 04/13/2013, 11:39 AM

## 2013-04-13 NOTE — CV Procedure (Signed)
PROCEDURE:  Left heart catheterization with selective coronary angiography, left ventriculogram.  INDICATIONS:  Chest pain with known CAD  The risks, benefits, and details of the procedure were explained to the patient.  The patient verbalized understanding and wanted to proceed.  Informed written consent was obtained.  PROCEDURE TECHNIQUE:  After Xylocaine anesthesia a 49F sheath was placed in the right femoral artery with a single anterior needle wall stick.   Left coronary angiography was done using a Judkins L4 guide catheter.  Right coronary angiography was done using a Judkins R4 guide catheter.  Left ventriculography was done using a pigtail catheter.    CONTRAST:  Total of 55 cc.  COMPLICATIONS:  None.    HEMODYNAMICS:  Aortic pressure was 142/1mmHg; LV pressure was 135/64mmHg; LVEDP 84mmHg.  There was no gradient between the left ventricle and aorta.    ANGIOGRAPHIC DATA:   The left main coronary artery is short and widely patent.  It bifurcates into an LAD and left circumflex arteries.  The left anterior descending artery is widely patent in its proximal portion and gives rise to a small to moderate sized diagonal. At the takeoff of the diagonal there is at least a 50-70% stenosis of the LAD which is only appreciated in the LAO cranial view.  The ongoing LAD is widely patent and traverses to the apex.  The left circumflex artery is widely patent.  It gives rise to a very large OM1 which is widely patent.  It then gives rise to an OM2 which is also large and widely patent.  The ongoing left circumflex is widely patent.    The right coronary artery is widely patent in its proximal portion and then gives rise to a moderate sized acute RV marginal branch.  The ongoing RCA is patent and distally bifurcates into a PDA and PL branches.  There is a 30% stenosis in the mid to distal RCA.  LEFT VENTRICULOGRAM:  Left ventricular angiogram was done in the 30 RAO projection and revealed normal  left ventricular wall motion and systolic function with an estimated ejection fraction of 55%.  LVEDP was 20 mmHg.  IMPRESSIONS:  1. Normal left main coronary artery. 2. 50-70% proximal LAD at the takeoff of the first diagonal 3. Normal left circumflex artery and its branches. 4. Nonobstructive disease of the right coronary artery with 30% mid to distal RCA lesion. 5. Normal left ventricular systolic function.  LVEDP 20 mmHg.  Ejection fraction 55%. 6.  Elevated LVEDP c/w diastolic dysfunction  RECOMMENDATION:   1.  Aggressive risk factor modification 2.  Films reviewed with Dr. Martin[place] - will plan flow wire to evaluate LAD lesion 3.  Increase Coreg to 25mg  BID 4.  Add Lasix 20mg  daily for elevated LVEDP 5.  Followup with my NP or PA in 2 weeks

## 2013-04-13 NOTE — Interval H&P Note (Signed)
Cath Lab Visit (complete for each Cath Lab visit)  Clinical Evaluation Leading to the Procedure:   ACS: no  Non-ACS:    Anginal Classification: CCS III  Anti-ischemic medical therapy: Minimal Therapy (1 class of medications)  Non-Invasive Test Results: Low-risk stress test findings: cardiac mortality <1%/year  Prior CABG: No previous CABG      History and Physical Interval Note:  04/13/2013 7:40 AM  Larry Arroyo  has presented today for surgery, with the diagnosis of Chest pain  The various methods of treatment have been discussed with the patient and family. After consideration of risks, benefits and other options for treatment, the patient has consented to  Procedure(s): LEFT HEART CATHETERIZATION WITH CORONARY ANGIOGRAM (N/A) as a surgical intervention .  The patient's history has been reviewed, patient examined, no change in status, stable for surgery.  I have reviewed the patient's chart and labs.  Questions were answered to the patient's satisfaction.     TURNER,TRACI R

## 2013-04-14 ENCOUNTER — Encounter (HOSPITAL_COMMUNITY): Payer: Self-pay | Admitting: Physician Assistant

## 2013-04-14 DIAGNOSIS — I10 Essential (primary) hypertension: Secondary | ICD-10-CM

## 2013-04-14 DIAGNOSIS — I209 Angina pectoris, unspecified: Secondary | ICD-10-CM

## 2013-04-14 DIAGNOSIS — G4710 Hypersomnia, unspecified: Secondary | ICD-10-CM

## 2013-04-14 DIAGNOSIS — E7800 Pure hypercholesterolemia, unspecified: Secondary | ICD-10-CM

## 2013-04-14 LAB — CBC
HCT: 36.8 % — ABNORMAL LOW (ref 39.0–52.0)
Hemoglobin: 12.4 g/dL — ABNORMAL LOW (ref 13.0–17.0)
MCH: 29.9 pg (ref 26.0–34.0)
MCHC: 33.7 g/dL (ref 30.0–36.0)
MCV: 88.7 fL (ref 78.0–100.0)
PLATELETS: 199 10*3/uL (ref 150–400)
RBC: 4.15 MIL/uL — AB (ref 4.22–5.81)
RDW: 13.6 % (ref 11.5–15.5)
WBC: 8 10*3/uL (ref 4.0–10.5)

## 2013-04-14 LAB — BASIC METABOLIC PANEL
BUN: 14 mg/dL (ref 6–23)
CHLORIDE: 103 meq/L (ref 96–112)
CO2: 24 meq/L (ref 19–32)
Calcium: 9.1 mg/dL (ref 8.4–10.5)
Creatinine, Ser: 1.15 mg/dL (ref 0.50–1.35)
GFR calc Af Amer: 76 mL/min — ABNORMAL LOW (ref >90)
GFR calc non Af Amer: 66 mL/min — ABNORMAL LOW (ref >90)
Glucose, Bld: 99 mg/dL (ref 70–99)
POTASSIUM: 4.8 meq/L (ref 3.7–5.3)
SODIUM: 139 meq/L (ref 137–147)

## 2013-04-14 MED ORDER — TICAGRELOR 90 MG PO TABS: 90.0000 mg | ORAL_TABLET | Freq: Two times a day (BID) | ORAL | Status: DC

## 2013-04-14 MED ORDER — NITROGLYCERIN 0.4 MG SL SUBL: 0.4000 mg | SUBLINGUAL_TABLET | SUBLINGUAL | Status: DC | PRN

## 2013-04-14 MED ORDER — INFLUENZA VAC SPLIT QUAD 0.5 ML IM SUSP
0.5000 mL | INTRAMUSCULAR | Status: AC
  Administered 2013-04-14: 0.5 mL via INTRAMUSCULAR
  Filled 2013-04-14 (×2): qty 0.5

## 2013-04-14 MED FILL — Sodium Chloride IV Soln 0.9%: INTRAVENOUS | Qty: 50 | Status: AC

## 2013-04-14 NOTE — Progress Notes (Signed)
CARDIAC REHAB PHASE I   PRE:  Rate/Rhythm: 103 ST  BP:  Supine:   Sitting: 152/102  Standing:    SaO2:   MODE:  Ambulation: 1000 ft   POST:  Rate/Rhythm: 103 ST  BP:  Supine:   Sitting: 182/127 recheck  160/102  Standing:    SaO2:  1856-3149 Pt tolerated ambulation well without c/o of cp or SOB. BP up before and after walk. Completed stent discharge education with pt. He voices understanding. Pt declines Outpt CRP due to traveling four days per week with his work. RN aware of BP.  Rodney Langton RN 04/14/2013 9:28 AM

## 2013-04-14 NOTE — Discharge Summary (Signed)
Record and procedure note reviewed. Plan discharge today. Aspirin and Brilinta for at least 1 year. Cardiac rehab. Return to see Dr. Radford Pax in 1-2 weeks. Groin is okay. NTG instuctions given.

## 2013-04-14 NOTE — Progress Notes (Signed)
Agree 

## 2013-04-14 NOTE — Discharge Instructions (Signed)
PLEASE REMEMBER TO BRING ALL OF YOUR MEDICATIONS TO EACH OF YOUR FOLLOW-UP OFFICE VISITS. ° °PLEASE ATTEND ALL SCHEDULED FOLLOW-UP APPOINTMENTS.  ° °Activity: Increase activity slowly as tolerated. You may shower, but no soaking baths (or swimming) for 1 week. No driving for 2 days. No lifting over 5 lbs for 1 week. No sexual activity for 1 week.  ° °You May Return to Work: in 1 week (if applicable) ° °Wound Care: You may wash cath site gently with soap and water. Keep cath site clean and dry. If you notice pain, swelling, bleeding or pus at your cath site, please call [phone]. ° ° ° °Cardiac Cath Site Care °Refer to this sheet in the next few weeks. These instructions provide you with information on caring for yourself after your procedure. Your caregiver may also give you more specific instructions. Your treatment has been planned according to current medical practices, but problems sometimes occur. Call your caregiver if you have any problems or questions after your procedure. °HOME CARE INSTRUCTIONS °· You may shower 24 hours after the procedure. Remove the bandage (dressing) and gently wash the site with plain soap and water. Gently pat the site dry.  °· Do not apply powder or lotion to the site.  °· Do not sit in a bathtub, swimming pool, or whirlpool for 5 to 7 days.  °· No bending, squatting, or lifting anything over 10 pounds (4.5 kg) as directed by your caregiver.  °· Inspect the site at least twice daily.  °· Do not drive home if you are discharged the same day of the procedure. Have someone else drive you.  °· You may drive 24 hours after the procedure unless otherwise instructed by your caregiver.  °What to expect: °· Any bruising will usually fade within 1 to 2 weeks.  °· Blood that collects in the tissue (hematoma) may be painful to the touch. It should usually decrease in size and tenderness within 1 to 2 weeks.  °SEEK IMMEDIATE MEDICAL CARE IF: °· You have unusual pain at the site or down the  affected limb.  °· You have redness, warmth, swelling, or pain at the site.  °· You have drainage (other than a small amount of blood on the dressing).  °· You have chills.  °· You have a fever or persistent symptoms for more than 72 hours.  °· You have a fever and your symptoms suddenly get worse.  °· Your leg becomes pale, cool, tingly, or numb.  °· You have heavy bleeding from the site. Hold pressure on the site.  °Document Released: 04/06/2010 Document Revised: 02/21/2011 Document Reviewed: 04/06/2010 °ExitCare® Patient Information ©2012 [employer]. ° °

## 2013-04-14 NOTE — Progress Notes (Signed)
     Patient Name: Larry Arroyo Date of Encounter: 04/14/2013  Active Problems:   H/O class III angina pectoris    SUBJECTIVE: No chest pain, no SOB.  OBJECTIVE Filed Vitals:   04/13/13 2100 04/13/13 2200 04/13/13 2359 04/14/13 0406  BP: 159/96 142/86 127/93 132/91  Pulse:   84 80  Temp:   97.5 F (36.4 C) 97.8 F (36.6 C)  TempSrc:   Oral Oral  Resp:   18 20  Height:      Weight:    185 lb 13.6 oz (84.3 kg)  SpO2:   97% 96%    Intake/Output Summary (Last 24 hours) at 04/14/13 0700 Last data filed at 04/14/13 0000  Gross per 24 hour  Intake 905.35 ml  Output   1400 ml  Net -494.65 ml   Filed Weights   04/13/13 0557 04/13/13 0558 04/14/13 0406  Weight: 185 lb (83.915 kg) 185 lb (83.915 kg) 185 lb 13.6 oz (84.3 kg)    PHYSICAL EXAM General: Well developed, well nourished, male in no acute distress. Head: Normocephalic, atraumatic.  Neck: Supple without bruits, JVD. Lungs:  Resp regular and unlabored, CTA. Heart: RRR, S1, S2, no S3, S4, or murmur; no rub. Abdomen: Soft, non-tender, non-distended, BS + x 4. Cath site without ecchymosis or hematoma or bruit Extremities: No clubbing, cyanosis, edema.  Neuro: Alert and oriented X 3. Moves all extremities spontaneously. Psych: Normal affect.  LABS: CBC: Recent Labs  04/14/13 0359  WBC 8.0  HGB 12.4*  HCT 36.8*  MCV 88.7  PLT 528   Basic Metabolic Panel: Recent Labs  04/14/13 0359  NA 139  K 4.8  CL 103  CO2 24  GLUCOSE 99  BUN 14  CREATININE 1.15  CALCIUM 9.1    TELE:  No significant ectopy      ECG: 04/14/2013 SR, no acute changes Vent. rate 76 BPM PR interval 150 ms QRS duration 78 ms QT/QTc 382/429 ms P-R-T axes 67 80 68  Radiology/Studies: No results found.   Current Medications:  . aspirin EC  81 mg Oral Daily  . carvedilol  12.5 mg Oral BID WC  . citalopram  40 mg Oral Daily  . psyllium  1 packet Oral BID  . ramipril  5 mg Oral BID  . simvastatin  40 mg Oral q1800  .  Ticagrelor  90 mg Oral BID      ASSESSMENT AND PLAN: Active Problems:   H/O class III angina pectoris - s/p FFR and 3.0 x 38 mm Alpine DES to the LAD. Tolerated well, stable overnight, plan d/c today and f/u in office in 2 weeks.   SignedRosaria Ferries , PA-C 7:00 AM 04/14/2013

## 2013-04-14 NOTE — Discharge Summary (Signed)
CARDIOLOGY DISCHARGE SUMMARY   Patient ID: Larry Arroyo MRN: [id number] DOB/AGE: 1950/11/10 63 y.o.  Admit date: 04/13/2013 Discharge date: 04/14/2013  PCP: Marcello Fennel, MD Primary Cardiologist: TT   Primary Discharge Diagnosis:  Class III Angina -  3.0 x 38 mm Alpine DES to the LAD Secondary Discharge Diagnosis:  Hypertension  Procedures:  PTCA and stenting of the proximal LAD. FFR and IVUS guidance   Hospital Course: Larry Arroyo is a 63 y.o. male with a history of CAD. He has been followed by Dr. Radford Pax for anginal symptoms. He continued to have chest pain and recent cath showed possible significant coronary artery disease in the LAD. He continued to have chest discomfort and FFR with possible stenting was recommended. He came to the hospital for the procedure on 1/27.  Procedure results are listed below. FFR was performed and he had significant stenosis in the LAD. This was treated with PTCA and a  3.0 x 38 mm Alpine DES to the LAD. He tolerated the procedure well the sheath was removed without difficulty.  On 1/28, he was seen by Dr. Tamala Julian. His groin was without ecchymosis, hematoma or bruit. He was seen by cardiac rehabilitation and educated on stent restrictions, heart-healthy lifestyle and exercise guidelines. He was ambulating without chest pain or shortness of breath and considered stable for discharge, to follow up as an outpatient.  Labs:   Lab Results  Component Value Date   WBC 8.0 04/14/2013   HGB 12.4* 04/14/2013   HCT 36.8* 04/14/2013   MCV 88.7 04/14/2013   PLT 199 04/14/2013     Recent Labs Lab 04/14/13 0359  NA 139  K 4.8  CL 103  CO2 24  BUN 14  CREATININE 1.15  CALCIUM 9.1  GLUCOSE 99      Cardiac Cath: 04/13/2013 Procedural Details: The right groin was prepped, draped, and anesthetized with 1% lidocaine. A 6 Fr sheath was exchanged for the diagnostic sheath in the right femoral artery. Weight-based bivalirudin was given for  anticoagulation. Once a therapeutic ACT was achieved, a 6 [place] XBLAD guide catheter was inserted. A Flow wire was used to cross the lesion and maximal hyperemia was obtained with IV Adenosine. FFR was 0.76 consistent with a hemodynamically significant lesion. Next a prowater coronary guidewire was used to cross the lesion. We then performed IVUS of the LAD. This demonstrated extensive plaque in the proximal to mid LAD with 3 focal areas of more severe stenosis. The lesion was then stented with a 3.0 x 38 mm Alpine stent. The stent was postdilated with a 3.5 mm noncompliant balloon. Repeat IVUS showed good stent expansion except for focal areas in the distal and proximal stent. These areas were then postdilated with a 3.75 mm noncompliant balloon. Following PCI, there was 0% residual stenosis and TIMI-3 flow. Final angiography confirmed an excellent result. The patient tolerated the procedure well. There were no immediate procedural complications. Femoral hemostasis was achieved with an Angioseal device. The patient was transferred to the post catheterization recovery area for further monitoring.  Lesion Data:  Vessel: LAD  Percent stenosis (pre): 70% with abnormal FFR.  TIMI-flow (pre): 3  Stent: 3.0 x 38 mm Alpine  Percent stenosis (post): 0%  TIMI-flow (post): 0%  Conclusions:  1. Abnormal FFR and IVUS assessment of the proximal LAD  2. Successful stenting of the proximal LAD with a DES.  Recommendations: Continue dual antiplatelet therapy for one year.   EKG: 1/28 Sinus rhythm  Vent. rate 76 BPM PR interval 150 ms QRS duration 78 ms QT/QTc 382/429 ms P-R-T axes 67 80 68  FOLLOW UP PLANS AND APPOINTMENTS No Known Allergies   Medication List         aspirin EC 81 MG tablet  Take 1 tablet (81 mg total) by mouth daily.     carvedilol 12.5 MG tablet  Commonly known as:  COREG  Take 1 tablet (12.5 mg total) by mouth 2 (two) times daily with a meal.     citalopram 40 MG tablet    Commonly known as:  CELEXA  Take 40 mg by mouth daily.     METAMUCIL 28.3 % Powd  Generic drug:  Psyllium  Take 1 scoop by mouth 2 (two) times daily before a meal. To 8 oz. of juice or water     nitroGLYCERIN 0.4 MG SL tablet  Commonly known as:  NITROSTAT  Place 1 tablet (0.4 mg total) under the tongue every 5 (five) minutes as needed for chest pain.     pravastatin 80 MG tablet  Commonly known as:  PRAVACHOL  Take 1 tablet (80 mg total) by mouth daily.     ramipril 5 MG capsule  Commonly known as:  ALTACE  Take 1 capsule (5 mg total) by mouth 2 (two) times daily.     tetrahydrozoline-zinc 0.05-0.25 % ophthalmic solution  Commonly known as:  VISINE-AC  Place 2 drops into both eyes 3 (three) times daily as needed. reddnes     Ticagrelor 90 MG Tabs tablet  Commonly known as:  BRILINTA  Take 1 tablet (90 mg total) by mouth 2 (two) times daily.        Discharge Orders   Future Appointments Provider Department Dept Phone   04/27/2013 11:30 AM [facility] [place] [facility] [phone]   07/27/2013 7:50 AM [facility] [phone]   Future Orders Complete By Expires   Diet - low sodium heart healthy  As directed    Increase activity slowly  As directed      Follow-up Information   Follow up with Richardson Dopp, PA-C On 04/27/2013. (at 11:30am)    Specialty:  Physician Assistant   Contact information:   [address] [place] [address] [phone]       BRING ALL MEDICATIONS WITH YOU TO FOLLOW UP APPOINTMENTS  Time spent with patient to include physician time: 38 min Signed: Rosaria Ferries, PA-C 04/14/2013, 9:24 AM Co-Sign MD

## 2013-04-14 NOTE — Discharge Summary (Signed)
Agree with assessment and plan. He is a primary cardiology patient of Dr. Ashok Norris and needs f/u with her.

## 2013-04-14 NOTE — Care Management Note (Signed)
    Page 1 of 1   04/14/2013     11:32:24 AM   CARE MANAGEMENT NOTE 04/14/2013  Patient:  Larry Arroyo, Larry Arroyo   Account Number:  [id number]  Date Initiated:  04/14/2013  Documentation initiated by:  [facility]  Subjective/Objective Assessment:   63 y.o. male with a history of ASCAD, HTN and dyslipidemia. Admitted with Chest Pain///Home with spouse     Action/Plan:   Left Heart Cath//Home with self care, benefits check   Anticipated DC Date:  04/14/2013   Anticipated DC Plan:  [place]  CM consult      Choice offered to / List presented to:             Status of service:  Completed, signed off Medicare Important Message given?   (If response is "NO", the following Medicare IM given date fields will be blank) Date Medicare IM given:   Date Additional Medicare IM given:    Discharge Disposition:    Per UR Regulation:    If discussed at Long Length of Stay Meetings, dates discussed:    Comments:  04/14/13 Ethel, RN, BSN, [employer] [phone] Spoke with pt at bedside regarding benefits check for Brilinta.  Pt has brochure with 30 day free card and refill assistance card intact.  Pt utilizes [facility] on [address] for prescription needs.  NCM called pharmacy to confirm availability of medication.  Information relayed to pt.  Pt verbalizes importance of filling medication upon discharge.

## 2013-04-27 ENCOUNTER — Ambulatory Visit (INDEPENDENT_AMBULATORY_CARE_PROVIDER_SITE_OTHER): Payer: PRIVATE HEALTH INSURANCE | Admitting: Physician Assistant

## 2013-04-27 ENCOUNTER — Encounter: Payer: Self-pay | Admitting: Physician Assistant

## 2013-04-27 VITALS — BP 130/88 | HR 72 | Ht 70.0 in | Wt 189.0 lb

## 2013-04-27 DIAGNOSIS — I5030 Unspecified diastolic (congestive) heart failure: Secondary | ICD-10-CM

## 2013-04-27 DIAGNOSIS — E7800 Pure hypercholesterolemia, unspecified: Secondary | ICD-10-CM

## 2013-04-27 DIAGNOSIS — I10 Essential (primary) hypertension: Secondary | ICD-10-CM

## 2013-04-27 DIAGNOSIS — I509 Heart failure, unspecified: Secondary | ICD-10-CM

## 2013-04-27 DIAGNOSIS — I2510 Atherosclerotic heart disease of native coronary artery without angina pectoris: Secondary | ICD-10-CM

## 2013-04-27 DIAGNOSIS — R079 Chest pain, unspecified: Secondary | ICD-10-CM

## 2013-04-27 MED ORDER — FUROSEMIDE 20 MG PO TABS: 20.0000 mg | ORAL_TABLET | Freq: Every day | ORAL | Status: DC

## 2013-04-27 NOTE — Progress Notes (Signed)
[address] [place],   [address] Phone: [phone] Fax:  [phone]  Date:  04/27/2013   ID:  Larry Arroyo, DOB 04/21/1950, MRN [id number]  PCP:   Melinda Crutch, MD  Cardiologist:  Dr. Fransico Him    History of Present Illness: Larry Arroyo is a 63 y.o. 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  He was seen by Dr. Fransico Him recently for chest pain.  He had a normal pharmacologic nuclear study.  Echo demonstrated normal LVF and mild diastolic dysfunction.  He continued to have chest pain and cardiac cath was arranged.  LHC demonstrated mod prox LAD stenosis and elevated LVEDP.  Lasix was added and Coreg was increased.  FFR demonstrated the LAD lesion to be hemodynamically significant and he underwent PCI with a DES to the proximal LAD.  DAPT recommended x 1 year.   Lexiscan Myoview (04/02/13):  No ischemia, EF 56%, low risk.  Echo (04/07/13):  EF 55-60%, Gr 1 DD. LHC (04/13/13):  LAD 50-70, mid to distal RCA 30, EF 55%, LVEDP 20.  FFR of LAD:  0.76.  PCI:  Alpine 3.0 x 38 mm DES to prox LAD.    Patient was not placed on Lasix at discharge. He had done well after discharge until this past weekend. While watching TV, he had some left chest pressure. This was like his previous symptoms but not as bad. He denies associated dyspnea, nausea, diaphoresis. He denies syncope. He denies exertional chest symptoms. He did take nitroglycerin this past weekend with relief. He denies any orthopnea, PND or edema. He has felt somewhat dizzy since increasing his Coreg.  He has not had any further chest pain.  Recent Labs: 03/24/2013: ALT 50; Direct LDL 147.2; HDL Cholesterol 55.70  04/14/2013: Creatinine 1.15; Hemoglobin 12.4*; Potassium 4.8   Wt Readings from Last 3 Encounters:  04/27/13 189 lb (85.73 kg)  04/14/13 185 lb 13.6 oz (84.3 kg)  04/14/13 185 lb 13.6 oz (84.3 kg)     Past Medical History  Diagnosis Date  . Complication of anesthesia     16 yrs  ago following hip replacement states took a long time for full memory to return  . Arthritis   . Anxiety   . Depression   . Sleep disturbance   . Coronary artery disease     nonobstructive ASCAD with 40-50% RCA, 60% D1, 70% inferior branche of D1  . Hypertension     ADDENDUM- STRESS TEST 9/12 with LOV DR TURNER ON CHART  . Hypercholesteremia   . Myocardial infarction   . Anginal pain     Current Outpatient Prescriptions  Medication Sig Dispense Refill  . aspirin EC 81 MG tablet Take 1 tablet (81 mg total) by mouth daily.      . carvedilol (COREG) 12.5 MG tablet Take 1 tablet (12.5 mg total) by mouth 2 (two) times daily with a meal.  60 tablet  11  . citalopram (CELEXA) 40 MG tablet Take 40 mg by mouth daily.       . nitroGLYCERIN (NITROSTAT) 0.4 MG SL tablet Place 1 tablet (0.4 mg total) under the tongue every 5 (five) minutes as needed for chest pain.  25 tablet  3  . pravastatin (PRAVACHOL) 80 MG tablet Take 1 tablet (80 mg total) by mouth daily.      . Psyllium (METAMUCIL) 28.3 % POWD Take 1 scoop by mouth 2 (two) times daily before a meal.  To 8 oz. of juice or water      . ramipril (ALTACE) 5 MG capsule Take 1 capsule (5 mg total) by mouth 2 (two) times daily.  180 capsule  3  . tetrahydrozoline-zinc (VISINE-AC) 0.05-0.25 % ophthalmic solution Place 2 drops into both eyes 3 (three) times daily as needed. reddnes      . Ticagrelor (BRILINTA) 90 MG TABS tablet Take 1 tablet (90 mg total) by mouth 2 (two) times daily.  60 tablet  11   No current facility-administered medications for this visit.    Allergies:   Review of patient's allergies indicates no known allergies.   Social History:  The patient  reports that he has never smoked. He has never used smokeless tobacco. He reports that he drinks about 7.2 ounces of alcohol per week. He reports that he does not use illicit drugs.   Family History:  The patient's family history includes Epilepsy (age of onset: 43) in his brother.    ROS:  Please see the history of present illness.      All other systems reviewed and negative.   PHYSICAL EXAM: VS:  BP 130/88  Pulse 72  Ht 5\' 10"  (1.778 m)  Wt 189 lb (85.73 kg)  BMI 27.12 kg/m2 Well nourished, well developed, in no acute distress HEENT: normal Neck: no JVD Cardiac:  normal S1, S2; RRR; no murmur Lungs:  clear to auscultation bilaterally, no wheezing, rhonchi or rales Abd: soft, nontender, no hepatomegaly Ext: no edemaright groin without hematoma or bruit  Skin: warm and dry Neuro:  CNs 2-12 intact, no focal abnormalities noted  EKG:  NSR, HR 72, normal axis, no acute changes     ASSESSMENT AND PLAN:  1. CAD: He did have an episode of chest discomfort recently. ECG is unchanged. He mainly had single-vessel disease.  He was to be discharged on Lasix. He is not taking Lasix at this time. I reviewed this with Dr. Radford Pax over the telephone. We decided to place him on Lasix 20 mg daily. He will be brought back in close follow up next week. We will obtain a basic metabolic panel at that time. If he has recurrent symptoms, consider stress testing versus re-look cardiac catheterization.  We discussed the importance of dual antiplatelet therapy. Continue statin.  He cannot attend cardiac rehab. 2. Diastolic CHF:  As noted, he had a significantly elevated LVEDP at cardiac catheterization. I will start Lasix 20 mg daily. Check a follow up basic metabolic panel next week. 3. Hypertension: Controlled. 4. Hyperlipidemia: Continue statin. 5. Disposition: Follow up with me in one week.  Signed, Richardson Dopp, PA-C  04/27/2013 11:51 AM

## 2013-04-27 NOTE — Patient Instructions (Signed)
LAB WORK IN 1 WEEK, BMET  LASIX 20 MG DAILY  PLEASE FOLLOW UP WITH DR. Radford Pax 1 WEEK

## 2013-04-30 ENCOUNTER — Telehealth: Payer: Self-pay | Admitting: Cardiology

## 2013-04-30 NOTE — Telephone Encounter (Signed)
Per most recent office note 2/10, appointments made for f/u with scott weaver on 2/18 at 3:40 pm, with lab (bmet) prior to appointment.  Pt agreeable.

## 2013-04-30 NOTE — Telephone Encounter (Signed)
New Message   Pt Called. Was advised per previous office visit to follow up with Dr. Radford Pax in 1 week. Pt requests a call back to confirm if this appt is needed.. Please advise

## 2013-05-05 ENCOUNTER — Encounter: Payer: Self-pay | Admitting: Physician Assistant

## 2013-05-05 ENCOUNTER — Ambulatory Visit (INDEPENDENT_AMBULATORY_CARE_PROVIDER_SITE_OTHER): Payer: PRIVATE HEALTH INSURANCE | Admitting: Physician Assistant

## 2013-05-05 ENCOUNTER — Encounter: Payer: PRIVATE HEALTH INSURANCE | Admitting: Physician Assistant

## 2013-05-05 ENCOUNTER — Other Ambulatory Visit: Payer: 59

## 2013-05-05 VITALS — BP 150/110 | HR 71 | Ht 70.0 in | Wt 189.0 lb

## 2013-05-05 DIAGNOSIS — I10 Essential (primary) hypertension: Secondary | ICD-10-CM

## 2013-05-05 DIAGNOSIS — I509 Heart failure, unspecified: Secondary | ICD-10-CM

## 2013-05-05 DIAGNOSIS — I2510 Atherosclerotic heart disease of native coronary artery without angina pectoris: Secondary | ICD-10-CM

## 2013-05-05 DIAGNOSIS — R079 Chest pain, unspecified: Secondary | ICD-10-CM

## 2013-05-05 DIAGNOSIS — I5030 Unspecified diastolic (congestive) heart failure: Secondary | ICD-10-CM

## 2013-05-05 DIAGNOSIS — E7800 Pure hypercholesterolemia, unspecified: Secondary | ICD-10-CM

## 2013-05-05 LAB — BASIC METABOLIC PANEL
BUN: 21 mg/dL (ref 6–23)
CALCIUM: 9.5 mg/dL (ref 8.4–10.5)
CO2: 26 meq/L (ref 19–32)
Chloride: 99 mEq/L (ref 96–112)
Creatinine, Ser: 1 mg/dL (ref 0.4–1.5)
GFR: 77.34 mL/min (ref >60.00)
GLUCOSE: 94 mg/dL (ref 70–99)
Potassium: 4.4 mEq/L (ref 3.5–5.1)
Sodium: 133 mEq/L — ABNORMAL LOW (ref 135–145)

## 2013-05-05 MED ORDER — AMLODIPINE BESYLATE 5 MG PO TABS: 5.0000 mg | ORAL_TABLET | Freq: Every day | ORAL | Status: DC

## 2013-05-05 NOTE — Progress Notes (Signed)
This encounter was created in error - please disregard.

## 2013-05-05 NOTE — Progress Notes (Signed)
[address] [place], [place]  [address] Phone: [phone] Fax:  [phone]  Date:  05/05/2013   ID:  Larry Arroyo, DOB 04-30-1950, MRN [id number]  PCP:   Melinda Crutch, MD  Cardiologist:  Dr. Fransico Him    History of Present Illness: Larry Arroyo is a 63 y.o. male with a hx of CAD, HTN, HL, OSA.  He was seen by Dr. Fransico Him recently for chest pain.  He had a normal pharmacologic nuclear study.  Echo demonstrated normal LVF and mild diastolic dysfunction.  He continued to have chest pain and cardiac cath was arranged.  LHC demonstrated mod prox LAD stenosis and elevated LVEDP.  Lasix was added and Coreg was increased.  FFR demonstrated the LAD lesion to be hemodynamically significant and he underwent PCI with a DES to the proximal LAD.  DAPT recommended x 1 year.   Lexiscan Myoview (04/02/13):  No ischemia, EF 56%, low risk.  Echo (04/07/13):  EF 55-60%, Gr 1 DD. LHC (04/13/13):  LAD 50-70, mid to distal RCA 30, EF 55%, LVEDP 20.  FFR of LAD:  0.76.  PCI:  Alpine 3.0 x 38 mm DES to prox LAD.    I saw him in follow up 04/27/13.  He had had one episode of chest pressure. He was also not on Lasix as had previously been recommended. I restarted his Lasix at 20 mg a day and brought him back today for follow up.  Since last seen, he has had a few more episodes of L chest pressure.  This always occurs at rest.  It is not associated with meals.  He denies exertional symptoms.  He does feel some in his back and jaw on the left.  He notes relief with NTG x 1.  No orthopnea, PND, edema.    Recent Labs: 03/24/2013: ALT 50; Direct LDL 147.2; HDL Cholesterol 55.70  04/14/2013: Creatinine 1.15; Hemoglobin 12.4*; Potassium 4.8   Wt Readings from Last 3 Encounters:  05/05/13 189 lb (85.73 kg)  04/27/13 189 lb (85.73 kg)  04/14/13 185 lb 13.6 oz (84.3 kg)     Past Medical History  Diagnosis Date  . Complication of anesthesia     16 yrs ago following hip replacement states took a long  time for full memory to return  . Arthritis   . Anxiety   . Depression   . Sleep disturbance   . Coronary artery disease     nonobstructive ASCAD with 40-50% RCA, 60% D1, 70% inferior branche of D1  . Hypertension     ADDENDUM- STRESS TEST 9/12 with LOV DR TURNER ON CHART  . Hypercholesteremia   . Myocardial infarction   . Anginal pain     Current Outpatient Prescriptions  Medication Sig Dispense Refill  . aspirin EC 81 MG tablet Take 1 tablet (81 mg total) by mouth daily.      . carvedilol (COREG) 12.5 MG tablet Take 1 tablet (12.5 mg total) by mouth 2 (two) times daily with a meal.  60 tablet  11  . citalopram (CELEXA) 40 MG tablet Take 40 mg by mouth daily.       . furosemide (LASIX) 20 MG tablet Take 1 tablet (20 mg total) by mouth daily.  90 tablet  3  . nitroGLYCERIN (NITROSTAT) 0.4 MG SL tablet Place 1 tablet (0.4 mg total) under the tongue every 5 (five) minutes as needed for chest pain.  25 tablet  3  . pravastatin (PRAVACHOL)  80 MG tablet Take 1 tablet (80 mg total) by mouth daily.      . Psyllium (METAMUCIL) 28.3 % POWD Take 1 scoop by mouth 2 (two) times daily before a meal. To 8 oz. of juice or water      . ramipril (ALTACE) 5 MG capsule Take 1 capsule (5 mg total) by mouth 2 (two) times daily.  180 capsule  3  . tetrahydrozoline-zinc (VISINE-AC) 0.05-0.25 % ophthalmic solution Place 2 drops into both eyes 3 (three) times daily as needed. reddnes      . Ticagrelor (BRILINTA) 90 MG TABS tablet Take 1 tablet (90 mg total) by mouth 2 (two) times daily.  60 tablet  11   No current facility-administered medications for this visit.    Allergies:   Review of patient's allergies indicates no known allergies.   Social History:  The patient  reports that he has never smoked. He has never used smokeless tobacco. He reports that he drinks about 7.2 ounces of alcohol per week. He reports that he does not use illicit drugs.   Family History:  The patient's family history includes  Epilepsy (age of onset: 84) in his brother.   ROS:  Please see the history of present illness.   No dysphagia, odynophagia, belching or water brash.   All other systems reviewed and negative.   PHYSICAL EXAM: VS:  BP 150/110  Pulse 71  Ht 5\' 10"  (1.778 m)  Wt 189 lb (85.73 kg)  BMI 27.12 kg/m2 Well nourished, well developed, in no acute distress HEENT: normal Neck: no JVD Cardiac:  normal S1, S2; RRR; no murmur Lungs:  clear to auscultation bilaterally, no wheezing, rhonchi or rales Abd: soft, nontender, no hepatomegaly Ext: no edema Skin: warm and dry Neuro:  CNs 2-12 intact, no focal abnormalities noted  EKG:  NSR, HR 71, normal axis, no ST changes     ASSESSMENT AND PLAN:  1. Chest Pain:  Typical and atypical features.  He denies exertional symptoms or significant associated dyspnea.  He does have relief with NTG.  BP remains high.  I reviewed with Dr. Fransico Him today.  We will adjust his BP medications first. Start Amlodipine 5 QD.  If he continues to have chest pain, will need to consider re-look cardiac cath. 2. CAD:  Adjust medications as noted.  Close f/u with Dr. Radford Pax.  Consider cardiac cath if symptoms continue.  Continue ASA, Brilinta, statin. 3. Diastolic CHF:  He is now on Lasix.  Volume appears stable.  He is NYHA Class 2-2b.  Check BMET today. 4. Hypertension:  BP above target. Add Amlodipine 5 mg QD. 5. Hyperlipidemia:  Continue statin. 6. Disposition:  F/u with Dr. Fransico Him in 1 week.   Signed, Richardson Dopp, PA-C  05/05/2013 3:20 PM

## 2013-05-05 NOTE — Progress Notes (Deleted)
[name] [place], [place]  [address] Phone: [phone] Fax:  [phone]  Date:  05/05/2013   ID:  Larry Arroyo, DOB [birth year], MRN [id number]  PCP:   Melinda Crutch, MD  Cardiologist:  Dr. Fransico Him    History of Present Illness: Larry Arroyo is a 63 y.o. male   Recent Labs: 03/24/2013: ALT 50; Direct LDL 147.2; HDL Cholesterol 55.70  04/14/2013: Creatinine 1.15; Hemoglobin 12.4*; Potassium 4.8   Wt Readings from Last 3 Encounters:  04/27/13 189 lb (85.73 kg)  04/14/13 185 lb 13.6 oz (84.3 kg)  04/14/13 185 lb 13.6 oz (84.3 kg)     Past Medical History  Diagnosis Date  . Complication of anesthesia     16 yrs ago following hip replacement states took a long time for full memory to return  . Arthritis   . Anxiety   . Depression   . Sleep disturbance   . Coronary artery disease     nonobstructive ASCAD with 40-50% RCA, 60% D1, 70% inferior branche of D1  . Hypertension     ADDENDUM- STRESS TEST 9/12 with LOV DR TURNER ON CHART  . Hypercholesteremia   . Myocardial infarction   . Anginal pain     Current Outpatient Prescriptions  Medication Sig Dispense Refill  . aspirin EC 81 MG tablet Take 1 tablet (81 mg total) by mouth daily.      . carvedilol (COREG) 12.5 MG tablet Take 1 tablet (12.5 mg total) by mouth 2 (two) times daily with a meal.  60 tablet  11  . citalopram (CELEXA) 40 MG tablet Take 40 mg by mouth daily.       . furosemide (LASIX) 20 MG tablet Take 1 tablet (20 mg total) by mouth daily.  90 tablet  3  . nitroGLYCERIN (NITROSTAT) 0.4 MG SL tablet Place 1 tablet (0.4 mg total) under the tongue every 5 (five) minutes as needed for chest pain.  25 tablet  3  . pravastatin (PRAVACHOL) 80 MG tablet Take 1 tablet (80 mg total) by mouth daily.      . Psyllium (METAMUCIL) 28.3 % POWD Take 1 scoop by mouth 2 (two) times daily before a meal. To 8 oz. of juice or water      . ramipril (ALTACE) 5 MG capsule Take 1 capsule (5 mg total) by mouth 2  (two) times daily.  180 capsule  3  . tetrahydrozoline-zinc (VISINE-AC) 0.05-0.25 % ophthalmic solution Place 2 drops into both eyes 3 (three) times daily as needed. reddnes      . Ticagrelor (BRILINTA) 90 MG TABS tablet Take 1 tablet (90 mg total) by mouth 2 (two) times daily.  60 tablet  11   No current facility-administered medications for this visit.    Allergies:   Review of patient's allergies indicates no known allergies.   Social History:  The patient  reports that he has never smoked. He has never used smokeless tobacco. He reports that he drinks about 7.2 ounces of alcohol per week. He reports that he does not use illicit drugs.   Family History:  The patient's family history includes Epilepsy (age of onset: 49) in his brother.   ROS:  Please see the history of present illness.  ***  All other systems reviewed and negative.   PHYSICAL EXAM: VS:  There were no vitals taken for this visit. Well nourished, well developed, in no acute distress HEENT: normal Neck: no JVD Cardiac:  normal  S1, S2; RRR; no murmur Lungs:  clear to auscultation bilaterally, no wheezing, rhonchi or rales Abd: soft, nontender, no hepatomegaly Ext: no edemaright groin without hematoma or bruit  Skin: warm and dry Neuro:  CNs 2-12 intact, no focal abnormalities noted  EKG:  ***   ASSESSMENT AND PLAN:  1. CAD:  *** 2. Diastolic CHF:  *** 3. Hypertension: ***Controlled. 4. Hyperlipidemia: ***Continue statin. 5. Disposition: Follow up with ***  Signed, Richardson Dopp, PA-C  05/05/2013 1:36 PM

## 2013-05-05 NOTE — Patient Instructions (Signed)
YOU HAVE A FOLLOW UP WITH DR. Radford Pax ON 05/10/13 @ 4:15  LAB WORK TODAY; BMET  Your physician has recommended you make the following change in your medication:  START NORVASC 5 MG 1 TABLET DAILY; RX SENT IN

## 2013-05-06 ENCOUNTER — Telehealth: Payer: Self-pay | Admitting: *Deleted

## 2013-05-06 NOTE — Telephone Encounter (Signed)
pt notified about lab results with verbal understanding  

## 2013-05-10 ENCOUNTER — Ambulatory Visit (INDEPENDENT_AMBULATORY_CARE_PROVIDER_SITE_OTHER): Payer: PRIVATE HEALTH INSURANCE | Admitting: Cardiology

## 2013-05-10 ENCOUNTER — Ambulatory Visit: Payer: PRIVATE HEALTH INSURANCE | Admitting: Cardiology

## 2013-05-10 ENCOUNTER — Encounter: Payer: Self-pay | Admitting: Cardiology

## 2013-05-10 ENCOUNTER — Encounter: Payer: Self-pay | Admitting: General Surgery

## 2013-05-10 VITALS — BP 128/95 | HR 75 | Ht 70.0 in | Wt 190.0 lb

## 2013-05-10 DIAGNOSIS — Z8679 Personal history of other diseases of the circulatory system: Secondary | ICD-10-CM

## 2013-05-10 DIAGNOSIS — R079 Chest pain, unspecified: Secondary | ICD-10-CM

## 2013-05-10 DIAGNOSIS — E7800 Pure hypercholesterolemia, unspecified: Secondary | ICD-10-CM

## 2013-05-10 DIAGNOSIS — I10 Essential (primary) hypertension: Secondary | ICD-10-CM

## 2013-05-10 DIAGNOSIS — I2510 Atherosclerotic heart disease of native coronary artery without angina pectoris: Secondary | ICD-10-CM

## 2013-05-10 MED ORDER — AMLODIPINE BESYLATE 10 MG PO TABS: 10.0000 mg | ORAL_TABLET | Freq: Every day | ORAL | Status: DC

## 2013-05-10 NOTE — Progress Notes (Signed)
[address], [place], [place]  [address] Phone: [phone] Fax:  [phone]  Date:  05/10/2013   ID:  Larry Arroyo, DOB 09/27/1950, MRN [id number]  PCP:   Melinda Crutch, MD  Cardiologist:  Fransico Him, MD    History of Present Illness: [name], [place]  [place], [place] [address]  Phone: [phone]  Fax: [phone]  Date: 05/05/2013  ID: Larry Arroyo, DOB 1951-02-28, MRN [id number]  PCP: Melinda Crutch, MD  Cardiologist: Dr. Fransico Him  History of Present Illness:  Larry Arroyo is a 63 y.o. male with a hx of CAD, HTN, HL, OSA. He was seen by Dr. Fransico Him recently for chest pain. He had a normal pharmacologic nuclear study. Echo demonstrated normal LVF and mild diastolic dysfunction. He continued to have chest pain and cardiac cath was arranged. LHC demonstrated mod prox LAD stenosis and elevated LVEDP. Lasix was added and Coreg was increased. FFR demonstrated the LAD lesion to be hemodynamically significant and he underwent PCI with a DES to the proximal LAD. DAPT recommended x 1 year.   Lexiscan Myoview (04/02/13): No ischemia, EF 56%, low risk.  Echo (04/07/13): EF 55-60%, Gr 1 DD.  LHC (04/13/13): LAD 50-70, mid to distal RCA 30, EF 55%, LVEDP 20. FFR of LAD: 0.76. PCI: Alpine 3.0 x 38 mm DES to prox LAD.   He was seen in follow up 04/27/13. He had had one episode of chest pressure. He was also not on Lasix as had previously been recommended. He was restarted on Lasix at 20 mg a day for elevated LVEDP at cath and brought back for follow up. He saw my PA and was having  a few more episodes of L chest pressure. It always occured at rest and was not associated with meals. He denied exertional symptoms.  He notes relief with NTG x 1. No orthopnea, PND, edema. He was started on amlodipine since BP was mildly elevated at last OV and now presents back for followup.  He continues to have episodes of chest pain at rest.  Once after taking his meds and the other episode  after walking his dog.  He says that he is getting lightheaded with pressure in his chest when he exerts himself and resolves with rest.  None of this has improved since starting amlodipine.    Wt Readings from Last 3 Encounters:  05/10/13 190 lb (86.183 kg)  05/05/13 189 lb (85.73 kg)  04/27/13 189 lb (85.73 kg)     Past Medical History  Diagnosis Date  . Complication of anesthesia     16 yrs ago following hip replacement states took a long time for full memory to return  . Arthritis   . Anxiety   . Depression   . Sleep disturbance   . Coronary artery disease     nonobstructive ASCAD with 40-50% RCA, 60% D1, 70% inferior branche of D1  . Hypertension     ADDENDUM- STRESS TEST 9/12 with LOV DR TURNER ON CHART  . Hypercholesteremia   . Myocardial infarction   . Anginal pain     Current Outpatient Prescriptions  Medication Sig Dispense Refill  . amLODipine (NORVASC) 5 MG tablet Take 1 tablet (5 mg total) by mouth daily.  30 tablet  11  . aspirin EC 81 MG tablet Take 1 tablet (81 mg total) by mouth daily.      . carvedilol (COREG) 12.5 MG tablet Take 1 tablet (12.5  mg total) by mouth 2 (two) times daily with a meal.  60 tablet  11  . citalopram (CELEXA) 40 MG tablet Take 40 mg by mouth daily.       . furosemide (LASIX) 20 MG tablet Take 1 tablet (20 mg total) by mouth daily.  90 tablet  3  . nitroGLYCERIN (NITROSTAT) 0.4 MG SL tablet Place 1 tablet (0.4 mg total) under the tongue every 5 (five) minutes as needed for chest pain.  25 tablet  3  . pravastatin (PRAVACHOL) 80 MG tablet Take 1 tablet (80 mg total) by mouth daily.      . Psyllium (METAMUCIL) 28.3 % POWD Take 1 scoop by mouth 2 (two) times daily before a meal. To 8 oz. of juice or water      . ramipril (ALTACE) 5 MG capsule Take 1 capsule (5 mg total) by mouth 2 (two) times daily.  180 capsule  3  . tetrahydrozoline-zinc (VISINE-AC) 0.05-0.25 % ophthalmic solution Place 2 drops into both eyes 3 (three) times daily as  needed. reddnes      . Ticagrelor (BRILINTA) 90 MG TABS tablet Take 1 tablet (90 mg total) by mouth 2 (two) times daily.  60 tablet  11   No current facility-administered medications for this visit.    Allergies:   No Known Allergies  Social History:  The patient  reports that he has never smoked. He has never used smokeless tobacco. He reports that he drinks about 7.2 ounces of alcohol per week. He reports that he does not use illicit drugs.   Family History:  The patient's family history includes Epilepsy (age of onset: 39) in his brother.   ROS:  Please see the history of present illness.      All other systems reviewed and negative.   PHYSICAL EXAM: VS:  BP 128/95  Pulse 75  Ht 5' 10" (1.778 m)  Wt 190 lb (86.183 kg)  BMI 27.26 kg/m2 Well nourished, well developed, in no acute distress HEENT: normal Neck: no JVD Cardiac:  normal S1, S2; RRR; no murmur Lungs:  clear to auscultation bilaterally, no wheezing, rhonchi or rales Abd: soft, nontender, no hepatomegaly Ext: no edema Skin: warm and dry Neuro:  CNs 2-12 intact, no focal abnormalities noted       ASSESSMENT AND PLAN:  1.   Chest pain most likely demand ischemia from poorly controlled HTN but even when getting BP under better control his chest pain has not improve.  He is very type A personality and says that he is a pretty anxious person.  He gets very stressed at anything.  I have discussed the case with Dr. McAlhaney and I have recommended that we repeat a cath to assess patency of LAD stent.  We will plan this for Wed 2/25 2.   ASCAD s/p recent PCI of LAD with DES   - continue ASA/Brilinta 3.   HTN - still with elevated diastolic BP   - increase amlodipine to 10mg daily  - continue altace/carvedilol 4.   Dyslipidemia - continue statin 5.   Chronic diastolic CHF  - continue Lasix  Followup wth 4 weeks  Signed, Traci Turner, MD 05/10/2013 4:02 PM  

## 2013-05-10 NOTE — Patient Instructions (Signed)
Your physician has recommended you make the following change in your medication: 1. Increase Amlodipine to 10 Mg 1 tablet daily  Your physician has requested that you have a cardiac catheterization. Cardiac catheterization is used to diagnose and/or treat various heart conditions. Doctors may recommend this procedure for a number of different reasons. The most common reason is to evaluate chest pain. Chest pain can be a symptom of coronary artery disease (CAD), and cardiac catheterization can show whether plaque is narrowing or blocking your heart's arteries. This procedure is also used to evaluate the valves, as well as measure the blood flow and oxygen levels in different parts of your heart. For further information please visit [email or web address]. Please follow instruction sheet, as given. (scheduled for 05/12/13 @ 1:00 PM Arrive at 11:00 AM  Your physician recommends that you go to the lab today for a PT/INR, BMET, and CBC w/diff  Your physician recommends that you schedule a follow-up appointment in: 4 Weeks with Dr Radford Pax

## 2013-05-11 LAB — PROTIME-INR
INR: 1.1 ratio — ABNORMAL HIGH (ref 0.8–1.0)
Prothrombin Time: 11.5 s (ref 10.2–12.4)

## 2013-05-11 LAB — CBC WITH DIFFERENTIAL/PLATELET
Basophils Absolute: 0.1 10*3/uL (ref 0.0–0.1)
Basophils Relative: 1 % (ref 0.0–3.0)
EOS ABS: 0.6 10*3/uL (ref 0.0–0.7)
Eosinophils Relative: 9.2 % — ABNORMAL HIGH (ref 0.0–5.0)
HCT: 39.7 % (ref 39.0–52.0)
Hemoglobin: 12.9 g/dL — ABNORMAL LOW (ref 13.0–17.0)
LYMPHS ABS: 1.4 10*3/uL (ref 0.7–4.0)
LYMPHS PCT: 23.6 % (ref 12.0–46.0)
MCHC: 32.6 g/dL (ref 30.0–36.0)
MCV: 91.6 fl (ref 78.0–100.0)
MONOS PCT: 11.5 % (ref 3.0–12.0)
Monocytes Absolute: 0.7 10*3/uL (ref 0.1–1.0)
Neutro Abs: 3.3 10*3/uL (ref 1.4–7.7)
Neutrophils Relative %: 54.7 % (ref 43.0–77.0)
PLATELETS: 194 10*3/uL (ref 150.0–400.0)
RBC: 4.33 Mil/uL (ref 4.22–5.81)
RDW: 13.9 % (ref 11.5–14.6)
WBC: 6 10*3/uL (ref 4.5–10.5)

## 2013-05-11 LAB — BASIC METABOLIC PANEL
BUN: 21 mg/dL (ref 6–23)
CALCIUM: 9.5 mg/dL (ref 8.4–10.5)
CO2: 27 meq/L (ref 19–32)
Chloride: 104 mEq/L (ref 96–112)
Creatinine, Ser: 1 mg/dL (ref 0.4–1.5)
GFR: 77.33 mL/min (ref >60.00)
GLUCOSE: 90 mg/dL (ref 70–99)
Potassium: 4.5 mEq/L (ref 3.5–5.1)
Sodium: 137 mEq/L (ref 135–145)

## 2013-05-12 ENCOUNTER — Encounter: Payer: Self-pay | Admitting: General Surgery

## 2013-05-12 ENCOUNTER — Encounter (HOSPITAL_COMMUNITY): Payer: Self-pay | Admitting: *Deleted

## 2013-05-12 ENCOUNTER — Ambulatory Visit (HOSPITAL_COMMUNITY)
Admission: RE | Admit: 2013-05-12 | Discharge: 2013-05-12 | Disposition: A | Discharge status: Home / Self Care | Payer: 59 | Source: Ambulatory Visit | Attending: Cardiovascular Disease | Admitting: Cardiovascular Disease

## 2013-05-12 ENCOUNTER — Encounter (HOSPITAL_COMMUNITY)
Admission: RE | Disposition: A | Discharge status: Home / Self Care | Payer: Self-pay | Source: Ambulatory Visit | Attending: Cardiovascular Disease

## 2013-05-12 DIAGNOSIS — R079 Chest pain, unspecified: Secondary | ICD-10-CM | POA: Insufficient documentation

## 2013-05-12 DIAGNOSIS — G4733 Obstructive sleep apnea (adult) (pediatric): Secondary | ICD-10-CM | POA: Insufficient documentation

## 2013-05-12 DIAGNOSIS — F3289 Other specified depressive episodes: Secondary | ICD-10-CM | POA: Insufficient documentation

## 2013-05-12 DIAGNOSIS — F329 Major depressive disorder, single episode, unspecified: Secondary | ICD-10-CM | POA: Insufficient documentation

## 2013-05-12 DIAGNOSIS — Z79899 Other long term (current) drug therapy: Secondary | ICD-10-CM | POA: Insufficient documentation

## 2013-05-12 DIAGNOSIS — Z96649 Presence of unspecified artificial hip joint: Secondary | ICD-10-CM | POA: Insufficient documentation

## 2013-05-12 DIAGNOSIS — I252 Old myocardial infarction: Secondary | ICD-10-CM | POA: Insufficient documentation

## 2013-05-12 DIAGNOSIS — E785 Hyperlipidemia, unspecified: Secondary | ICD-10-CM | POA: Insufficient documentation

## 2013-05-12 DIAGNOSIS — I10 Essential (primary) hypertension: Secondary | ICD-10-CM | POA: Insufficient documentation

## 2013-05-12 DIAGNOSIS — I2510 Atherosclerotic heart disease of native coronary artery without angina pectoris: Secondary | ICD-10-CM | POA: Insufficient documentation

## 2013-05-12 DIAGNOSIS — Z7982 Long term (current) use of aspirin: Secondary | ICD-10-CM | POA: Insufficient documentation

## 2013-05-12 DIAGNOSIS — F411 Generalized anxiety disorder: Secondary | ICD-10-CM | POA: Insufficient documentation

## 2013-05-12 HISTORY — PX: LEFT HEART CATHETERIZATION WITH CORONARY ANGIOGRAM: SHX5451

## 2013-05-12 SURGERY — LEFT HEART CATHETERIZATION WITH CORONARY ANGIOGRAM
Anesthesia: LOCAL

## 2013-05-12 MED ORDER — SODIUM CHLORIDE 0.9 % IJ SOLN
3.0000 mL | INTRAMUSCULAR | Status: DC | PRN
Start: 2013-05-12 — End: 2013-05-12

## 2013-05-12 MED ORDER — VERAPAMIL HCL 2.5 MG/ML IV SOLN
INTRAVENOUS | Status: AC
  Filled 2013-05-12: qty 2

## 2013-05-12 MED ORDER — HEPARIN SODIUM (PORCINE) 1000 UNIT/ML IJ SOLN
INTRAMUSCULAR | Status: AC
  Filled 2013-05-12: qty 1

## 2013-05-12 MED ORDER — MIDAZOLAM HCL 2 MG/2ML IJ SOLN
INTRAMUSCULAR | Status: AC
  Filled 2013-05-12: qty 2

## 2013-05-12 MED ORDER — SODIUM CHLORIDE 0.9 % IV SOLN: INTRAVENOUS | Status: DC

## 2013-05-12 MED ORDER — ASPIRIN 81 MG PO CHEW: 81.0000 mg | CHEWABLE_TABLET | ORAL | Status: DC

## 2013-05-12 MED ORDER — SODIUM CHLORIDE 0.9 % IV SOLN
INTRAVENOUS | Status: DC
  Administered 2013-05-12: 12:00:00 via INTRAVENOUS

## 2013-05-12 MED ORDER — SODIUM CHLORIDE 0.9 % IV SOLN
250.0000 mL | INTRAVENOUS | Status: DC | PRN
Start: 2013-05-12 — End: 2013-05-12

## 2013-05-12 MED ORDER — SODIUM CHLORIDE 0.9 % IJ SOLN: 3.0000 mL | Freq: Two times a day (BID) | INTRAMUSCULAR | Status: DC

## 2013-05-12 MED ORDER — HEPARIN (PORCINE) IN NACL 2-0.9 UNIT/ML-% IJ SOLN
INTRAMUSCULAR | Status: AC
  Filled 2013-05-12: qty 1000

## 2013-05-12 MED ORDER — LIDOCAINE HCL (PF) 1 % IJ SOLN
INTRAMUSCULAR | Status: AC
  Filled 2013-05-12: qty 30

## 2013-05-12 MED ORDER — NITROGLYCERIN 0.2 MG/ML ON CALL CATH LAB
INTRAVENOUS | Status: AC
  Filled 2013-05-12: qty 1

## 2013-05-12 MED ORDER — FENTANYL CITRATE 0.05 MG/ML IJ SOLN
INTRAMUSCULAR | Status: AC
  Filled 2013-05-12: qty 2

## 2013-05-12 NOTE — H&P (View-Only) (Signed)
[address], [place], [place]  [address] Phone: [phone] Fax:  [phone]  Date:  05/10/2013   ID:  Larry Arroyo, DOB 09/27/1950, MRN [id number]  PCP:   Melinda Crutch, MD  Cardiologist:  Fransico Him, MD    History of Present Illness: [name], [place]  [place], [place] [address]  Phone: [phone]  Fax: [phone]  Date: 05/05/2013  ID: Larry Arroyo, DOB 1951-02-28, MRN [id number]  PCP: Melinda Crutch, MD  Cardiologist: Dr. Fransico Him  History of Present Illness:  Larry Arroyo is a 63 y.o. male with a hx of CAD, HTN, HL, OSA. He was seen by Dr. Fransico Him recently for chest pain. He had a normal pharmacologic nuclear study. Echo demonstrated normal LVF and mild diastolic dysfunction. He continued to have chest pain and cardiac cath was arranged. LHC demonstrated mod prox LAD stenosis and elevated LVEDP. Lasix was added and Coreg was increased. FFR demonstrated the LAD lesion to be hemodynamically significant and he underwent PCI with a DES to the proximal LAD. DAPT recommended x 1 year.   Lexiscan Myoview (04/02/13): No ischemia, EF 56%, low risk.  Echo (04/07/13): EF 55-60%, Gr 1 DD.  LHC (04/13/13): LAD 50-70, mid to distal RCA 30, EF 55%, LVEDP 20. FFR of LAD: 0.76. PCI: Alpine 3.0 x 38 mm DES to prox LAD.   He was seen in follow up 04/27/13. He had had one episode of chest pressure. He was also not on Lasix as had previously been recommended. He was restarted on Lasix at 20 mg a day for elevated LVEDP at cath and brought back for follow up. He saw my PA and was having  a few more episodes of L chest pressure. It always occured at rest and was not associated with meals. He denied exertional symptoms.  He notes relief with NTG x 1. No orthopnea, PND, edema. He was started on amlodipine since BP was mildly elevated at last OV and now presents back for followup.  He continues to have episodes of chest pain at rest.  Once after taking his meds and the other episode  after walking his dog.  He says that he is getting lightheaded with pressure in his chest when he exerts himself and resolves with rest.  None of this has improved since starting amlodipine.    Wt Readings from Last 3 Encounters:  05/10/13 190 lb (86.183 kg)  05/05/13 189 lb (85.73 kg)  04/27/13 189 lb (85.73 kg)     Past Medical History  Diagnosis Date  . Complication of anesthesia     16 yrs ago following hip replacement states took a long time for full memory to return  . Arthritis   . Anxiety   . Depression   . Sleep disturbance   . Coronary artery disease     nonobstructive ASCAD with 40-50% RCA, 60% D1, 70% inferior branche of D1  . Hypertension     ADDENDUM- STRESS TEST 9/12 with LOV DR Alyn Jurney ON CHART  . Hypercholesteremia   . Myocardial infarction   . Anginal pain     Current Outpatient Prescriptions  Medication Sig Dispense Refill  . amLODipine (NORVASC) 5 MG tablet Take 1 tablet (5 mg total) by mouth daily.  30 tablet  11  . aspirin EC 81 MG tablet Take 1 tablet (81 mg total) by mouth daily.      . carvedilol (COREG) 12.5 MG tablet Take 1 tablet (12.5  mg total) by mouth 2 (two) times daily with a meal.  60 tablet  11  . citalopram (CELEXA) 40 MG tablet Take 40 mg by mouth daily.       . furosemide (LASIX) 20 MG tablet Take 1 tablet (20 mg total) by mouth daily.  90 tablet  3  . nitroGLYCERIN (NITROSTAT) 0.4 MG SL tablet Place 1 tablet (0.4 mg total) under the tongue every 5 (five) minutes as needed for chest pain.  25 tablet  3  . pravastatin (PRAVACHOL) 80 MG tablet Take 1 tablet (80 mg total) by mouth daily.      . Psyllium (METAMUCIL) 28.3 % POWD Take 1 scoop by mouth 2 (two) times daily before a meal. To 8 oz. of juice or water      . ramipril (ALTACE) 5 MG capsule Take 1 capsule (5 mg total) by mouth 2 (two) times daily.  180 capsule  3  . tetrahydrozoline-zinc (VISINE-AC) 0.05-0.25 % ophthalmic solution Place 2 drops into both eyes 3 (three) times daily as  needed. reddnes      . Ticagrelor (BRILINTA) 90 MG TABS tablet Take 1 tablet (90 mg total) by mouth 2 (two) times daily.  60 tablet  11   No current facility-administered medications for this visit.    Allergies:   No Known Allergies  Social History:  The patient  reports that he has never smoked. He has never used smokeless tobacco. He reports that he drinks about 7.2 ounces of alcohol per week. He reports that he does not use illicit drugs.   Family History:  The patient's family history includes Epilepsy (age of onset: 74) in his brother.   ROS:  Please see the history of present illness.      All other systems reviewed and negative.   PHYSICAL EXAM: VS:  BP 128/95  Pulse 75  Ht 5\' 10"  (1.778 m)  Wt 190 lb (86.183 kg)  BMI 27.26 kg/m2 Well nourished, well developed, in no acute distress HEENT: normal Neck: no JVD Cardiac:  normal S1, S2; RRR; no murmur Lungs:  clear to auscultation bilaterally, no wheezing, rhonchi or rales Abd: soft, nontender, no hepatomegaly Ext: no edema Skin: warm and dry Neuro:  CNs 2-12 intact, no focal abnormalities noted       ASSESSMENT AND PLAN:  1.   Chest pain most likely demand ischemia from poorly controlled HTN but even when getting BP under better control his chest pain has not improve.  He is very type A personality and says that he is a pretty anxious person.  He gets very stressed at anything.  I have discussed the case with Dr. Julianne Handler and I have recommended that we repeat a cath to assess patency of LAD stent.  We will plan this for Wed 2/25 2.   ASCAD s/p recent PCI of LAD with DES   - continue ASA/Brilinta 3.   HTN - still with elevated diastolic BP   - increase amlodipine to 10mg  daily  - continue altace/carvedilol 4.   Dyslipidemia - continue statin 5.   Chronic diastolic CHF  - continue Lasix  Followup wth 4 weeks  Signed, Fransico Him, MD 05/10/2013 4:02 PM

## 2013-05-12 NOTE — Interval H&P Note (Signed)
History and Physical Interval Note:  05/12/2013 12:24 PM  Larry Arroyo  has presented today for cardiac cath with the diagnosis of chest pain, known CAD. The various methods of treatment have been discussed with the patient and family. After consideration of risks, benefits and other options for treatment, the patient has consented to  Procedure(s): LEFT HEART CATHETERIZATION WITH CORONARY ANGIOGRAM (N/A) as a surgical intervention .  The patient's history has been reviewed, patient examined, no change in status, stable for surgery.  I have reviewed the patient's chart and labs.  Questions were answered to the patient's satisfaction.    Cath Lab Visit (complete for each Cath Lab visit)  Clinical Evaluation Leading to the Procedure:   ACS: no  Non-ACS:    Anginal Classification: CCS III  Anti-ischemic medical therapy: Maximal Therapy (2 or more classes of medications)  Non-Invasive Test Results: No non-invasive testing performed  Prior CABG: No previous CABG        Larry Arroyo

## 2013-05-12 NOTE — Discharge Instructions (Signed)

## 2013-05-12 NOTE — CV Procedure (Signed)
      Cardiac Catheterization Operative Report  Larry Arroyo [id number] 2/25/20151:05 PM  Melinda Crutch, MD  Procedure Performed:  1. Left Heart Catheterization 2. Selective Coronary Angiography 3. Left ventricular angiogram  Operator: Lauree Chandler, MD  Arterial access site:  Right radial artery.   Indication:  63 yo male with history of CAD with recent DES placed proximal LAD in January 2015 by Dr. Martinique. He has followed up with Dr. Radford Pax and despite maximal medical therapy continues to have episodes of chest pressure that is concerning for unstable angina.                                     Procedure Details: The risks, benefits, complications, treatment options, and expected outcomes were discussed with the patient. The patient and/or family concurred with the proposed plan, giving informed consent. The patient was brought to the cath lab after IV hydration was begun and oral premedication was given. The patient was further sedated with Versed and Fentanyl. The right wrist was assessed with an Allens test which was positive. The right wrist was prepped and draped in a sterile fashion. 1% lidocaine was used for local anesthesia. Using the modified Seldinger access technique, a 5 French sheath was placed in the right radial artery. 3 mg Verapamil was given through the sheath. 4500 units IV heparin was given. Standard diagnostic catheters were used to perform selective coronary angiography. A pigtail catheter was used to perform a left ventricular angiogram. The sheath was removed from the right radial artery and a Terumo hemostasis band was applied at the arteriotomy site on the right wrist.   There were no immediate complications. The patient was taken to the recovery area in stable condition.   Hemodynamic Findings: Central aortic pressure: 97/64 Left ventricular pressure: 94/1/13  Angiographic Findings:  Left main: No obstructive disease.   Left Anterior Descending  Artery: Large caliber vessel that courses to the apex. There is a 20-30% ostial stenosis. The proximal stent is patent without restenosis. The mid vessel has mild luminal irregularities. There is a small caliber branch that arises from the stented segment of the LAD. This branch has no significant ostial disease and has good flow.   Circumflex Artery: Moderate caliber vessel with two moderate caliber obtuse marginal branches. No obstructive disease.   Right Coronary Artery: Large caliber dominant vessel with 30% mid stenosis.   Left Ventricular Angiogram: LVEF=55-60%.   Impression: 1. Stable single vessel CAD with patent stent proximal LAD 2. Mild disease in the RCA 3. Normal LV systolic function 4. Non-cardiac chest pain  Recommendations: Continue medical management of CAD.        Complications:  None. The patient tolerated the procedure well.

## 2013-07-27 ENCOUNTER — Other Ambulatory Visit (INDEPENDENT_AMBULATORY_CARE_PROVIDER_SITE_OTHER): Payer: 59

## 2013-07-27 DIAGNOSIS — I2510 Atherosclerotic heart disease of native coronary artery without angina pectoris: Secondary | ICD-10-CM

## 2013-07-27 LAB — HEPATIC FUNCTION PANEL
ALBUMIN: 4 g/dL (ref 3.5–5.2)
ALT: 24 U/L (ref 0–53)
AST: 24 U/L (ref 0–37)
Alkaline Phosphatase: 72 U/L (ref 39–117)
BILIRUBIN DIRECT: 0 mg/dL (ref 0.0–0.3)
TOTAL PROTEIN: 7.1 g/dL (ref 6.0–8.3)
Total Bilirubin: 0.7 mg/dL (ref 0.2–1.2)

## 2013-07-27 LAB — LIPID PANEL
CHOLESTEROL: 239 mg/dL — AB (ref 0–200)
HDL: 53.2 mg/dL (ref >39.00)
LDL CALC: 163 mg/dL — AB (ref 0–99)
Total CHOL/HDL Ratio: 4
Triglycerides: 115 mg/dL (ref 0.0–149.0)
VLDL: 23 mg/dL (ref 0.0–40.0)

## 2013-07-30 ENCOUNTER — Encounter: Payer: Self-pay | Admitting: General Surgery

## 2013-07-30 ENCOUNTER — Other Ambulatory Visit: Payer: Self-pay | Admitting: General Surgery

## 2013-07-30 DIAGNOSIS — E7800 Pure hypercholesterolemia, unspecified: Secondary | ICD-10-CM

## 2013-07-30 MED ORDER — ATORVASTATIN CALCIUM 10 MG PO TABS: 10.0000 mg | ORAL_TABLET | Freq: Every day | ORAL | Status: DC

## 2013-08-16 ENCOUNTER — Other Ambulatory Visit: Payer: Self-pay | Admitting: Physician Assistant

## 2013-08-23 ENCOUNTER — Other Ambulatory Visit: Payer: Self-pay | Admitting: Physician Assistant

## 2013-09-10 ENCOUNTER — Other Ambulatory Visit: Payer: PRIVATE HEALTH INSURANCE

## 2013-09-20 ENCOUNTER — Other Ambulatory Visit: Payer: Self-pay | Admitting: Physician Assistant

## 2013-09-24 ENCOUNTER — Other Ambulatory Visit: Payer: PRIVATE HEALTH INSURANCE

## 2013-09-28 ENCOUNTER — Ambulatory Visit (INDEPENDENT_AMBULATORY_CARE_PROVIDER_SITE_OTHER): Payer: 59 | Admitting: Physician Assistant

## 2013-09-28 ENCOUNTER — Encounter: Payer: Self-pay | Admitting: Physician Assistant

## 2013-09-28 ENCOUNTER — Other Ambulatory Visit: Payer: 59

## 2013-09-28 ENCOUNTER — Telehealth: Payer: Self-pay | Admitting: *Deleted

## 2013-09-28 VITALS — BP 130/100 | HR 86 | Ht 70.0 in | Wt 190.0 lb

## 2013-09-28 DIAGNOSIS — I509 Heart failure, unspecified: Secondary | ICD-10-CM

## 2013-09-28 DIAGNOSIS — I2510 Atherosclerotic heart disease of native coronary artery without angina pectoris: Secondary | ICD-10-CM

## 2013-09-28 DIAGNOSIS — I10 Essential (primary) hypertension: Secondary | ICD-10-CM

## 2013-09-28 DIAGNOSIS — I5032 Chronic diastolic (congestive) heart failure: Secondary | ICD-10-CM

## 2013-09-28 DIAGNOSIS — R079 Chest pain, unspecified: Secondary | ICD-10-CM

## 2013-09-28 DIAGNOSIS — R42 Dizziness and giddiness: Secondary | ICD-10-CM

## 2013-09-28 DIAGNOSIS — E7800 Pure hypercholesterolemia, unspecified: Secondary | ICD-10-CM

## 2013-09-28 LAB — BASIC METABOLIC PANEL
BUN: 15 mg/dL (ref 6–23)
CO2: 25 meq/L (ref 19–32)
CREATININE: 1 mg/dL (ref 0.4–1.5)
Calcium: 9.5 mg/dL (ref 8.4–10.5)
Chloride: 105 mEq/L (ref 96–112)
GFR: 83.78 mL/min (ref >60.00)
Glucose, Bld: 100 mg/dL — ABNORMAL HIGH (ref 70–99)
Potassium: 4.1 mEq/L (ref 3.5–5.1)
Sodium: 138 mEq/L (ref 135–145)

## 2013-09-28 LAB — CBC WITH DIFFERENTIAL/PLATELET
Basophils Absolute: 0 10*3/uL (ref 0.0–0.1)
Basophils Relative: 0.2 % (ref 0.0–3.0)
EOS PCT: 5.3 % — AB (ref 0.0–5.0)
Eosinophils Absolute: 0.4 10*3/uL (ref 0.0–0.7)
HEMATOCRIT: 42 % (ref 39.0–52.0)
HEMOGLOBIN: 13.9 g/dL (ref 13.0–17.0)
LYMPHS ABS: 1.2 10*3/uL (ref 0.7–4.0)
Lymphocytes Relative: 15.8 % (ref 12.0–46.0)
MCHC: 33.1 g/dL (ref 30.0–36.0)
MCV: 89.4 fl (ref 78.0–100.0)
MONO ABS: 0.8 10*3/uL (ref 0.1–1.0)
MONOS PCT: 11.3 % (ref 3.0–12.0)
NEUTROS ABS: 4.9 10*3/uL (ref 1.4–7.7)
Neutrophils Relative %: 67.4 % (ref 43.0–77.0)
PLATELETS: 229 10*3/uL (ref 150.0–400.0)
RBC: 4.7 Mil/uL (ref 4.22–5.81)
RDW: 14.5 % (ref 11.5–15.5)
WBC: 7.3 10*3/uL (ref 4.0–10.5)

## 2013-09-28 MED ORDER — AMLODIPINE BESYLATE 2.5 MG PO TABS: 2.5000 mg | ORAL_TABLET | Freq: Every day | ORAL | Status: DC

## 2013-09-28 MED ORDER — FUROSEMIDE 20 MG PO TABS: ORAL_TABLET | ORAL | Status: DC

## 2013-09-28 NOTE — Telephone Encounter (Signed)
pt notified about lab results with verbal understanding  

## 2013-09-28 NOTE — Progress Notes (Signed)
Cardiology Office Note   Date:  09/28/2013   ID:  JAYVYN HASELTON, DOB [birth year], MRN [id number]  PCP:   Melinda Crutch, MD  Cardiologist:  Dr. Fransico Him    History of Present Illness: Larry Arroyo is a 63 y.o. male with a hx of CAD, HTN, HL, OSA.  He is s/p DES to the proximal LAD in 03/2013.  He had recurrent episodes of chest pain.  He ultimately underwent re-look cath 04/2013 that demonstrated a patent LAD stent.  He has not been seen since that time.  He comes in today with some concerns about dizziness.  He feels it is related to his medications.  He does tell me that his BP is higher in clinic and his BP at home has been as low as 40J systolic.  He has gotten lightheaded while driving that has concerned.  He pulled off the road on one occasion.  He has occasional atypical chest pains from time to time.  This is unchanged.  He does take NTG for it.  He notes dyspnea with more extreme activities. He is NYHA 2-2b.   He denies orthopnea, PND, edema.  He denies syncope.     Studies:   - Lexiscan Myoview (04/02/13):  No ischemia, EF 56%, low risk.   - Echo (04/07/13):  EF 55-60%, Gr 1 DD.  - LHC (04/13/13):  LAD 50-70, mid to distal RCA 30, EF 55%, LVEDP 20.  FFR of LAD:  0.76.  PCI:  Alpine 3.0 x 38 mm DES to prox LAD.    - LHC (05/12/13):  Ostial LAD 20-30%, prox LAD stent ok, mid RCA 30%, EF 55-60%.     Recent Labs: 03/24/2013: Direct LDL 147.2  05/10/2013: Creatinine 1.0; Hemoglobin 12.9*; Potassium 4.5  07/27/2013: ALT 24; HDL Cholesterol by NMR 53.20; LDL (calc) 163*   Wt Readings from Last 3 Encounters:  09/28/13 190 lb (86.183 kg)  05/12/13 190 lb (86.183 kg)  05/12/13 190 lb (86.183 kg)     Past Medical History  Diagnosis Date  . Complication of anesthesia     16 yrs ago following hip replacement states took a long time for full memory to return  . Arthritis   . Anxiety   . Depression   . Sleep disturbance   . Coronary artery disease     nonobstructive ASCAD with 40-50%  RCA, 60% D1, 70% inferior branche of D1  . Hypertension     ADDENDUM- STRESS TEST 9/12 with LOV DR TURNER ON CHART  . Hypercholesteremia   . Myocardial infarction   . Anginal pain     Current Outpatient Prescriptions  Medication Sig Dispense Refill  . amLODipine (NORVASC) 5 MG tablet Take 5 mg by mouth daily.      Marland Kitchen aspirin EC 81 MG tablet Take 1 tablet (81 mg total) by mouth daily.      Marland Kitchen atorvastatin (LIPITOR) 10 MG tablet Take 1 tablet (10 mg total) by mouth daily.  30 tablet  6  . carvedilol (COREG) 12.5 MG tablet Take 1 tablet (12.5 mg total) by mouth 2 (two) times daily with a meal.  60 tablet  11  . citalopram (CELEXA) 40 MG tablet Take 40 mg by mouth daily.       . furosemide (LASIX) 20 MG tablet Take 1 tablet (20 mg total) by mouth daily.  90 tablet  3  . nitroGLYCERIN (NITROSTAT) 0.4 MG SL tablet Place 1 tablet (0.4 mg total) under the tongue every  5 (five) minutes as needed for chest pain.  25 tablet  3  . Psyllium (METAMUCIL) 28.3 % POWD Take 1 scoop by mouth 2 (two) times daily before a meal. To 8 oz. of juice or water      . ramipril (ALTACE) 5 MG capsule Take 1 capsule (5 mg total) by mouth 2 (two) times daily.  180 capsule  3  . tetrahydrozoline-zinc (VISINE-AC) 0.05-0.25 % ophthalmic solution Place 2 drops into both eyes 3 (three) times daily as needed. reddnes      . Ticagrelor (BRILINTA) 90 MG TABS tablet Take 1 tablet (90 mg total) by mouth 2 (two) times daily.  60 tablet  11   No current facility-administered medications for this visit.    Allergies:   Review of patient's allergies indicates no known allergies.   Social History:  The patient  reports that he has never smoked. He has never used smokeless tobacco. He reports that he drinks about 7.2 ounces of alcohol per week. He reports that he does not use illicit drugs.   Family History:  The patient's family history includes Epilepsy (age of onset: 74) in his brother; Stroke in his brother and another family  member.   ROS:  Please see the history of present illness.    All other systems reviewed and negative.   PHYSICAL EXAM: VS:  BP 130/100  Pulse 86  Ht 5\' 10"  (1.778 m)  Wt 190 lb (86.183 kg)  BMI 27.26 kg/m2 Well nourished, well developed, in no acute distress HEENT: normal Neck: no JVD Cardiac:  normal S1, S2; RRR; no murmur Lungs:  clear to auscultation bilaterally, no wheezing, rhonchi or rales Abd: soft, nontender, no hepatomegaly Ext: no edema Skin: warm and dry Neuro:  CNs 2-12 intact, no focal abnormalities noted  EKG:  NSR, HR 86, normal axis, no ST changes     ASSESSMENT AND PLAN:  1. Dizziness:  He likely has a component of white coat HTN.  He tells me his BP is as low as 80 systolic at times.  He was placed on Lasix due to elevated LVEDP at his initial cath.  I will stop his Lasix.  I will reduce his Amlodipine to 2.5 mg QD.  I have asked him to get his BP cuff checked.  He should monitor his BP daily and when he feels dizzy.  Check a BMET and CBC today.  See me in 3 weeks.  He will bring his BP diary with him.  2. Chest Pain:  No change since his last cath.  I do not think this is cardiac.  It sounds MSK.  No further workup. 3. CAD:  Continue ASA, Brilinta, statin. 4. Diastolic CHF:  Ok to hold lasix.  If dyspnea worsens or weight increases, add Lasix back.  5. Hypertension:  Adjust medications as noted.  6. Hyperlipidemia:  Continue statin. 7. Disposition:  F/u with me in 3 weeks.   Signed, Richardson Dopp, PA-C  09/28/2013 12:28 PM

## 2013-09-28 NOTE — Patient Instructions (Signed)
HOLD LASIX PER SCOTT WEAVER, PAC UNTIL FURTHER NOTIFIED   DECREASE AMLODIPINE TO 2.5 MG DAILY; NEW RX SENT IN FOR THE 2.5 MG TABLET  LAB WORK TODAY; BMET, CBC   CHECK BP ONCE DAILY AND ALSO WHEN YOU ARE DIZZY  PLEASE BRING YOUR BP READINGS TO YOUR NEXT APPT WITH [place] 10/20/13 8:30

## 2013-10-06 ENCOUNTER — Ambulatory Visit: Payer: 59 | Admitting: Physician Assistant

## 2013-10-06 ENCOUNTER — Other Ambulatory Visit: Payer: 59

## 2013-10-12 ENCOUNTER — Ambulatory Visit: Payer: 59 | Admitting: Physician Assistant

## 2013-10-12 ENCOUNTER — Other Ambulatory Visit: Payer: 59

## 2013-10-13 ENCOUNTER — Encounter: Payer: Self-pay | Admitting: General Surgery

## 2013-10-13 ENCOUNTER — Telehealth: Payer: Self-pay | Admitting: Cardiology

## 2013-10-13 NOTE — Telephone Encounter (Signed)
New message     Patient cannot get into mychart

## 2013-10-13 NOTE — Telephone Encounter (Signed)
Gave pt number to connect them to help desk for [facility]

## 2013-10-20 ENCOUNTER — Encounter: Payer: Self-pay | Admitting: Physician Assistant

## 2013-10-20 ENCOUNTER — Ambulatory Visit (INDEPENDENT_AMBULATORY_CARE_PROVIDER_SITE_OTHER): Payer: 59 | Admitting: Physician Assistant

## 2013-10-20 VITALS — BP 130/100 | HR 74 | Ht 70.0 in | Wt 188.0 lb

## 2013-10-20 DIAGNOSIS — R079 Chest pain, unspecified: Secondary | ICD-10-CM

## 2013-10-20 DIAGNOSIS — I10 Essential (primary) hypertension: Secondary | ICD-10-CM

## 2013-10-20 DIAGNOSIS — R0602 Shortness of breath: Secondary | ICD-10-CM

## 2013-10-20 DIAGNOSIS — IMO0001 Reserved for inherently not codable concepts without codable children: Secondary | ICD-10-CM

## 2013-10-20 DIAGNOSIS — I509 Heart failure, unspecified: Secondary | ICD-10-CM

## 2013-10-20 DIAGNOSIS — I2510 Atherosclerotic heart disease of native coronary artery without angina pectoris: Secondary | ICD-10-CM

## 2013-10-20 DIAGNOSIS — M791 Myalgia, unspecified site: Secondary | ICD-10-CM

## 2013-10-20 DIAGNOSIS — I5032 Chronic diastolic (congestive) heart failure: Secondary | ICD-10-CM

## 2013-10-20 DIAGNOSIS — R42 Dizziness and giddiness: Secondary | ICD-10-CM

## 2013-10-20 DIAGNOSIS — E7800 Pure hypercholesterolemia, unspecified: Secondary | ICD-10-CM

## 2013-10-20 LAB — BASIC METABOLIC PANEL WITH GFR
BUN: 15 mg/dL (ref 6–23)
CO2: 25 meq/L (ref 19–32)
Calcium: 9.1 mg/dL (ref 8.4–10.5)
Chloride: 102 meq/L (ref 96–112)
Creatinine, Ser: 1 mg/dL (ref 0.4–1.5)
GFR: 81.79 mL/min
Glucose, Bld: 93 mg/dL (ref 70–99)
Potassium: 4.6 meq/L (ref 3.5–5.1)
Sodium: 135 meq/L (ref 135–145)

## 2013-10-20 LAB — HEPATIC FUNCTION PANEL
ALT: 32 U/L (ref 0–53)
AST: 24 U/L (ref 0–37)
Albumin: 4.1 g/dL (ref 3.5–5.2)
Alkaline Phosphatase: 82 U/L (ref 39–117)
BILIRUBIN DIRECT: 0 mg/dL (ref 0.0–0.3)
Total Bilirubin: 0.7 mg/dL (ref 0.2–1.2)
Total Protein: 7.2 g/dL (ref 6.0–8.3)

## 2013-10-20 LAB — BRAIN NATRIURETIC PEPTIDE: Pro B Natriuretic peptide (BNP): 46 pg/mL (ref 0.0–100.0)

## 2013-10-20 MED ORDER — CLOPIDOGREL BISULFATE 75 MG PO TABS: 75.0000 mg | ORAL_TABLET | Freq: Every day | ORAL | Status: DC

## 2013-10-20 NOTE — Progress Notes (Signed)
Cardiology Office Note   Date:  10/20/2013   ID:  Camil, Wilhelmsen 1950/05/21, MRN [id number]  PCP:   Melinda Crutch, MD  Cardiologist:  Dr. Fransico Him    History of Present Illness: GARYN ARLOTTA is a 63 y.o. male with a hx of CAD, HTN, HL, OSA.  He is s/p DES to the proximal LAD in 03/2013.  He had recurrent episodes of chest pain.  He ultimately underwent re-look cath 04/2013 that demonstrated a patent LAD stent.  He was recently seen on 09/28/13 with complaints of dizziness. It was felt that this was related to his blood pressure. Adjustments were made and he was asked to bring back a blood pressure diary today in follow up.    He returns for f/u.  Continues to have a myriad of symptoms.  Mainly complains of dizziness and dyspnea.  Difficult to know what predates his PCI and what started afterward.  Notes significant DOE.  However, describes NYHA 2-2b symptoms.  No orthopnea, PND, edema.  No significant change in symptoms with holding Lasix.  Dizziness occurs while sitting as well as during activity.  Feels lightheaded.  No spinning.  No syncope.  He brought in a list of BPs.  BP is all over the place (low of 78/55 >>> high of 165/107).  Admits to compliance.  Watches salt.  Has occasional chest pressure.  Denies chest pain with emotional stress. But, states the he gets chest pain when he does his work.  He travels for his job.  Afraid to travel b/c of dizziness.  Finds himself pulling over on side of road.  Has told employer he can't travel right now.  Chest pain predates PCI.  No change with PCI.  Had relook cath for symptoms that demonstrated patent stent and no significant CAD elsewhere.  NTG does help chest pain.     Studies:  - Lexiscan Myoview (04/02/13):  No ischemia, EF 56%, low risk.   - Echo (04/07/13):  EF 55-60%, Gr 1 DD.  - LHC (04/13/13):  LAD 50-70, mid to distal RCA 30, EF 55%, LVEDP 20.  FFR of LAD:  0.76.  PCI:  Alpine 3.0 x 38 mm DES to prox LAD.    - LHC (05/12/13):  Ostial  LAD 20-30%, prox LAD stent ok, mid RCA 30%, EF 55-60%.     Recent Labs: 03/24/2013: Direct LDL 147.2  07/27/2013: ALT 24; HDL Cholesterol by NMR 53.20; LDL (calc) 163*  09/28/2013: Creatinine 1.0; Hemoglobin 13.9; Potassium 4.1   Wt Readings from Last 3 Encounters:  10/20/13 188 lb (85.276 kg)  09/28/13 190 lb (86.183 kg)  05/12/13 190 lb (86.183 kg)     Past Medical History  Diagnosis Date  . Complication of anesthesia     16 yrs ago following hip replacement states took a long time for full memory to return  . Arthritis   . Anxiety   . Depression   . Sleep disturbance   . Coronary artery disease     nonobstructive ASCAD with 40-50% RCA, 60% D1, 70% inferior branche of D1  . Hypertension     ADDENDUM- STRESS TEST 9/12 with LOV DR TURNER ON CHART  . Hypercholesteremia   . Myocardial infarction   . Anginal pain     Current Outpatient Prescriptions  Medication Sig Dispense Refill  . amLODipine (NORVASC) 2.5 MG tablet Take 1 tablet (2.5 mg total) by mouth daily.  30 tablet  11  . aspirin EC 81 MG  tablet Take 1 tablet (81 mg total) by mouth daily.      Marland Kitchen atorvastatin (LIPITOR) 10 MG tablet Take 1 tablet (10 mg total) by mouth daily.  30 tablet  6  . carvedilol (COREG) 12.5 MG tablet Take 1 tablet (12.5 mg total) by mouth 2 (two) times daily with a meal.  60 tablet  11  . citalopram (CELEXA) 40 MG tablet Take 40 mg by mouth daily.       . furosemide (LASIX) 20 MG tablet 09/28/13 PER Drinda Belgard, PAC HOLD LASIX UNTIL FURTHER ADVISED      . nitroGLYCERIN (NITROSTAT) 0.4 MG SL tablet Place 1 tablet (0.4 mg total) under the tongue every 5 (five) minutes as needed for chest pain.  25 tablet  3  . Psyllium (METAMUCIL) 28.3 % POWD Take 1 scoop by mouth 2 (two) times daily before a meal. To 8 oz. of juice or water      . ramipril (ALTACE) 5 MG capsule Take 1 capsule (5 mg total) by mouth 2 (two) times daily.  180 capsule  3  . tetrahydrozoline-zinc (VISINE-AC) 0.05-0.25 % ophthalmic  solution Place 2 drops into both eyes 3 (three) times daily as needed. reddnes      . Ticagrelor (BRILINTA) 90 MG TABS tablet Take 1 tablet (90 mg total) by mouth 2 (two) times daily.  60 tablet  11   No current facility-administered medications for this visit.    Allergies:   Review of patient's allergies indicates no known allergies.   Social History:  The patient  reports that he has never smoked. He has never used smokeless tobacco. He reports that he drinks about 7.2 ounces of alcohol per week. He reports that he does not use illicit drugs.   Family History:  The patient's family history includes Epilepsy (age of onset: 93) in his brother; Stroke in his brother and another family member. There is no history of Heart attack.   ROS:  Please see the history of present illness.  Has a lot of leg cramping and muscle pain.  All other systems reviewed and negative.   PHYSICAL EXAM: VS:  BP 130/100  Pulse 74  Ht 5' 10" (1.778 m)  Wt 188 lb (85.276 kg)  BMI 26.98 kg/m2 Well nourished, well developed, in no acute distress HEENT: normal Neck: no JVD Vascular:  No carotid bruits Cardiac:  normal S1, S2; RRR; no murmur Lungs:  clear to auscultation bilaterally, no wheezing, rhonchi or rales Abd: soft, nontender, no hepatomegaly Ext: no edema Skin: warm and dry Neuro:  CNs 2-12 intact, no focal abnormalities noted   EKG:  NSR, HR 74, normal axis, no ST changes, no change from prior tracing    ASSESSMENT AND PLAN:  Dizziness:  Symptoms are difficult to explain.  BP is all over the map (very low to very high).  Not sure dizziness explained by BP.  Will arrange event monitor to rule out arrhythmia.  Question if anxiety playing a role.  I have asked him to follow up with PCP to further manage anxiety.    Chest Pain:  Atypical symptoms.  However, relieved by NTG.  Symptoms predate PCI and have not changed since.  I asked him to get OTC Pepcid to see if this helps.  At this point, I do not  think that he needs a stress test.  Dyspnea:  LVEDP was high at original cath.  Lasix has not changed symptoms.  Holding Lasix has not helped.  Will  get BMET and BNP today.  Will also stop Brilinta.  Question if all symptoms may be related to side effect of dyspnea from Brilinta.  Will switch to Plavix 75 mg QD.    Hypertension:  Not sure what to make of BP.  Not sure how to adjust medications at this point.  Will get 24 Hr urine for metanephrines and catecholamines.  Consider renal artery [place] if problems continue.  Patient does not like to take medications.  However, he assures me that he is compliant with medications.  CAD:  Continue ASA.  Stop Brilinta and start Plavix.  Continue statin.  Diastolic CHF:  Not clear he is volume overloaded.  Check BNP.  Add Lasix back if elevated.    Hyperlipidemia:  He has a lot of muscle pain, etc as well.  Get CPK.  Hold statin.  Anxiety:  Question if all related to this.  I have asked him to contact his PCP for earlier follow up.  Disposition:  F/u with Dr. Fransico Him in 1 month.   Signed, Richardson Dopp, PA-C  10/20/2013 8:38 AM

## 2013-10-20 NOTE — Patient Instructions (Addendum)
Your physician has recommended you make the following change in your medication:  1) STOP Brilinta 2) START Plavix 75mg  daily. And Rx has been sent to your pharmacy 3) HOLD Lipitor. Until further instructed  Lab Today: Bmet, Bnp, Lft, Total CK, 24 hour urine   Your physician has recommended that you wear an event monitor. Event monitors are medical devices that record the heart's electrical activity. Doctors most often [place] these monitors to diagnose arrhythmias. Arrhythmias are problems with the speed or rhythm of the heartbeat. The monitor is a small, portable device. You can wear one while you do your normal daily activities. This is usually used to diagnose what is causing palpitations/syncope (passing out).  Follow up with your PCP for Anxiety  Your physician recommends that you schedule a follow-up appointment in: 4-6 weeks with Dr.Turner

## 2013-10-21 ENCOUNTER — Other Ambulatory Visit: Payer: 59

## 2013-10-21 ENCOUNTER — Telehealth: Payer: Self-pay | Admitting: *Deleted

## 2013-10-21 ENCOUNTER — Encounter (INDEPENDENT_AMBULATORY_CARE_PROVIDER_SITE_OTHER): Payer: 59

## 2013-10-21 ENCOUNTER — Encounter: Payer: Self-pay | Admitting: Radiology

## 2013-10-21 DIAGNOSIS — R42 Dizziness and giddiness: Secondary | ICD-10-CM

## 2013-10-21 LAB — CK TOTAL AND CKMB (NOT AT ARMC)
CK, MB: <0.7 ng/mL (ref 0.0–5.0)
Total CK: 60 U/L (ref 7–232)

## 2013-10-21 NOTE — Progress Notes (Signed)
Patient ID: Larry Arroyo, male   DOB: 09/23/1950, 63 y.o.   MRN: [id number] E cardio 30 day monitor applied to pt. EOS 10-19-13

## 2013-10-21 NOTE — Telephone Encounter (Signed)
yet and that we will call when these results come in. Pt said ok and thank you and asked if we would call his cell w/results since he is going out of town, said no problem

## 2013-10-25 LAB — CATECHOLAMINES, FRACTIONATED, URINE, 24 HOUR
Calculated Total (E+NE): 45 mcg/24 h (ref 26–121)
Creatinine, Urine mg/day-CATEUR: 1.45 g/(24.h) (ref 0.63–2.50)
Dopamine, 24 hr Urine: 272 mcg/24 h (ref 52–480)
Epinephrine, 24 hr Urine: 10 mcg/24 h (ref 2–24)
Norepinephrine, 24 hr Ur: 35 mcg/24 h (ref 15–100)
Total Volume - CF 24Hr U: 1700 mL

## 2013-10-25 LAB — METANEPHRINES, URINE, 24 HOUR
METANEPH TOTAL UR: 408 ug/(24.h) (ref 224–832)
Metanephrines, Ur: 199 mcg/24 h (ref 90–315)
Normetanephrine, 24H Ur: 209 mcg/24 h (ref 122–676)

## 2013-11-24 ENCOUNTER — Telehealth: Payer: Self-pay | Admitting: Cardiology

## 2013-11-24 NOTE — Telephone Encounter (Signed)
Please let patient know that heart monitor showed NSR with no arrhythmias 

## 2013-11-24 NOTE — Telephone Encounter (Signed)
Pt is aware.  

## 2013-12-01 ENCOUNTER — Ambulatory Visit (INDEPENDENT_AMBULATORY_CARE_PROVIDER_SITE_OTHER): Payer: 59 | Admitting: Cardiology

## 2013-12-01 ENCOUNTER — Other Ambulatory Visit: Payer: Self-pay | Admitting: Cardiology

## 2013-12-01 ENCOUNTER — Other Ambulatory Visit (INDEPENDENT_AMBULATORY_CARE_PROVIDER_SITE_OTHER): Payer: 59

## 2013-12-01 ENCOUNTER — Ambulatory Visit (INDEPENDENT_AMBULATORY_CARE_PROVIDER_SITE_OTHER)
Admission: RE | Admit: 2013-12-01 | Discharge: 2013-12-01 | Disposition: A | Discharge status: Home / Self Care | Payer: 59 | Source: Ambulatory Visit | Attending: Cardiology | Admitting: Cardiology

## 2013-12-01 ENCOUNTER — Encounter: Payer: Self-pay | Admitting: Cardiology

## 2013-12-01 VITALS — BP 128/96 | HR 75 | Ht 70.0 in | Wt 188.2 lb

## 2013-12-01 DIAGNOSIS — R079 Chest pain, unspecified: Secondary | ICD-10-CM

## 2013-12-01 DIAGNOSIS — R42 Dizziness and giddiness: Secondary | ICD-10-CM

## 2013-12-01 DIAGNOSIS — E7800 Pure hypercholesterolemia, unspecified: Secondary | ICD-10-CM

## 2013-12-01 DIAGNOSIS — I2510 Atherosclerotic heart disease of native coronary artery without angina pectoris: Secondary | ICD-10-CM

## 2013-12-01 DIAGNOSIS — Z03810 Encounter for observation for suspected exposure to anthrax ruled out: Secondary | ICD-10-CM

## 2013-12-01 DIAGNOSIS — I5032 Chronic diastolic (congestive) heart failure: Secondary | ICD-10-CM

## 2013-12-01 DIAGNOSIS — I509 Heart failure, unspecified: Secondary | ICD-10-CM

## 2013-12-01 DIAGNOSIS — I10 Essential (primary) hypertension: Secondary | ICD-10-CM

## 2013-12-01 MED ORDER — PANTOPRAZOLE SODIUM 40 MG PO TBEC: 40.0000 mg | DELAYED_RELEASE_TABLET | Freq: Every day | ORAL | Status: DC

## 2013-12-01 NOTE — H&P (Signed)
[address], [place], [place]  [address] Phone: [phone] Fax:  [phone]  Date:  12/01/2013   ID:  Larry Arroyo, DOB Jul 17, 1950, MRN [id number]  PCP:   Melinda Crutch, MD  Cardiologist:  Fransico Him, MD     History of Present Illness: Larry Arroyo is a 63 y.o. male with a hx of CAD, HTN, HL, OSA. He is s/p DES to the proximal LAD in 03/2013. He had recurrent episodes of chest pain. He ultimately underwent re-look cath 04/2013 that demonstrated a patent LAD stent.  He recently returned for f/u with PA Richardson Dopp.  At that time he continued to have a myriad of symptoms as stated in  note from 8/5. Mainly complaining of dizziness, blurred vision and dyspnea. Difficult to know what predates his PCI and what started afterward. He was complaining of significant DOE but no orthopnea, PND, edema. He had no significant change in symptoms with holding Lasix. His dizziness occurs while sitting as well as during activity. Feels lightheaded. No spinning. No syncope. On last OV he brought in a list of BPs. BP was all over the place (low of 78/55 >>> high of 165/107). Admits to compliance. Watches salt. He continued to have occasional chest pressure. He denied any chest pain with emotional stress, but he would get chest pain when he would  work. He travels for his job and is afraid to travel b/c of dizziness. He finds himself pulling over on side of road. Has told employer he can't travel right now. Chest pain predates PCI. No change after PCI. He had a relook cath for symptoms that demonstrated patent stent and no significant CAD elsewhere. He recently wore a heart monitor for dizziness which showed NSR with no arrhythmias.  He presents back today for followup.  He continues to complains of chest pain which he states is getting worse.  It can awaken him at night.  It is midsternal.  He was instructed to start Prilosec at last OV but never started it.  He still complains of dizziness and visual  changes.  He is convinced that his chest pain is still related to his heart.   He says that now it occurs with minimal exertion and cannot do much.  He continues to have significant DOE.      Wt Readings from Last 3 Encounters:  12/01/13 188 lb 3.2 oz (85.367 kg)  10/20/13 188 lb (85.276 kg)  09/28/13 190 lb (86.183 kg)     Past Medical History  Diagnosis Date  . Complication of anesthesia     16 yrs ago following hip replacement states took a long time for full memory to return  . Arthritis   . Anxiety   . Depression   . Sleep disturbance   . Coronary artery disease     nonobstructive ASCAD with 40-50% RCA, 60% D1, 70% inferior branche of D1  . Hypertension     ADDENDUM- STRESS TEST 9/12 with LOV DR TURNER ON CHART  . Hypercholesteremia   . Myocardial infarction   . Anginal pain     Current Outpatient Prescriptions  Medication Sig Dispense Refill  . amLODipine (NORVASC) 2.5 MG tablet Take 1 tablet (2.5 mg total) by mouth daily.  30 tablet  11  . aspirin EC 81 MG tablet Take 1 tablet (81 mg total) by mouth daily.      Marland Kitchen atorvastatin (LIPITOR) 10 MG tablet Take 1 tablet (10 mg total) by mouth daily.  30 tablet  6  . carvedilol (COREG) 12.5 MG tablet Take 1 tablet (12.5 mg total) by mouth 2 (two) times daily with a meal.  60 tablet  11  . citalopram (CELEXA) 40 MG tablet Take 40 mg by mouth daily.       . clopidogrel (PLAVIX) 75 MG tablet Take 1 tablet (75 mg total) by mouth daily.  30 tablet  11  . nitroGLYCERIN (NITROSTAT) 0.4 MG SL tablet Place 1 tablet (0.4 mg total) under the tongue every 5 (five) minutes as needed for chest pain.  25 tablet  3  . Psyllium (METAMUCIL) 28.3 % POWD Take 1 scoop by mouth 2 (two) times daily before a meal. To 8 oz. of juice or water      . ramipril (ALTACE) 5 MG capsule Take 1 capsule (5 mg total) by mouth 2 (two) times daily.  180 capsule  3  . tetrahydrozoline-zinc (VISINE-AC) 0.05-0.25 % ophthalmic solution Place 2 drops into both eyes 3  (three) times daily as needed. reddnes      . furosemide (LASIX) 20 MG tablet 09/28/13 PER SCOTT WEAVER, PAC HOLD LASIX UNTIL FURTHER ADVISED       No current facility-administered medications for this visit.    Allergies:   No Known Allergies  Social History:  The patient  reports that he has never smoked. He has never used smokeless tobacco. He reports that he drinks about 7.2 ounces of alcohol per week. He reports that he does not use illicit drugs.   Family History:  The patient's family history includes Epilepsy (age of onset: 91) in his brother; Stroke in his brother and another family member. There is no history of Heart attack.   ROS:  Please see the history of present illness.      All other systems reviewed and negative.   PHYSICAL EXAM: VS:  BP 128/96  Pulse 75  Ht _0  (1.778 m)  Wt 188 lb 3.2 oz (85.367 kg)  BMI 27.00 kg/m2 Well nourished, well developed, in no acute distress HEENT: normal Neck: no JVD Cardiac:  normal S1, S2; RRR; no murmur Lungs:  clear to auscultation bilaterally, no wheezing, rhonchi or rales Abd: soft, nontender, no hepatomegaly Ext: no edema Skin: warm and dry Neuro:  CNs 2-12 intact, no focal abnormalities noted       ASSESSMENT AND PLAN:  Dizziness: Recent heart monitor was normal  He continues to have problems with dizziness and visual changes.  Question if anxiety playing a role. He was supposed to follow up with PCP to further manage anxiety but never followed up. Chest Pain: Atypical symptoms. However, relieved by NTG. Symptoms predate PCI and have not changed since. It was recommended that he start on OTC Pepcid to see if this helps but he never did.  His symptoms can occur with exertion or when then in bed and can wake him up.  His symptoms are improved with NTG.  I am questioning whether his CP is actually GI in nature with GERD and esophageal spasm.  They occur some at night and his CP never improved after cath.  I have asked him to  start Protonix 77m daily.  He is convinced that his CP is due to CAD and his nuclear stress test was normal at the time of his cath showing LAD stenosis.  At this time I have recommended that we repeat cath since he is >6 months out and may have instent restenosis.  If cath shows patent  stent then would recommend GI evaluation. Dyspnea: LVEDP was high at original cath. Lasix has not changed symptoms. Holding Lasix has not helped. BNP at last OV was normal and Brilinta was changed to Plavix in case that was causing his SOB. His SOB has improved after changing to Plavix some but is still present and at time he says is severe. Hypertension: Improved today but DBP still elevated. Continue Coreg/ACE I /amlodipine CAD: Continue ASA/ Plavix. Continue statin.  Diastolic CHF: Euvolemic on exam Hyperlipidemia: He has a lot of muscle pain but  CPK was normal.  Continue Lipitor Anxiety: Question if all his symptoms are related to this. I have asked him to contact his PCP for earlier follow up.   Signed, Fransico Him, MD 12/01/2013 9:55 AM

## 2013-12-01 NOTE — Patient Instructions (Addendum)
Your physician has recommended you make the following change in your medication: start taking Protonix 40 mg daily  Your physician has requested that you have a cardiac catheterization. Cardiac catheterization is used to diagnose and/or treat various heart conditions. Doctors may recommend this procedure for a number of different reasons. The most common reason is to evaluate chest pain. Chest pain can be a symptom of coronary artery disease (CAD), and cardiac catheterization can show whether plaque is narrowing or blocking your heart's arteries. This procedure is also used to evaluate the valves, as well as measure the blood flow and oxygen levels in different parts of your heart. For further information please visit [email or web address]. Please follow instruction sheet, as given.  A chest x-ray takes a picture of the organs and structures inside the chest, including the heart, lungs, and blood vessels. This test can show several things, including, whether the heart is enlarges; whether fluid is building up in the lungs; and whether pacemaker / defibrillator leads are still in place.  Your physician recommends that you return for lab work in: today  Your physician recommends that you schedule a follow-up appointment in: after cath and in 6 months with Dr Radford Pax  Per Dr Radford Pax the pt needs an appointament with his PCP, Dr Harle Battiest for dizziness and visual changes by the end of next week.

## 2013-12-01 NOTE — Progress Notes (Addendum)
[address], [place],   [address] Phone: [phone] Fax:  [phone]  Date:  12/01/2013   ID:  Larry Arroyo, DOB [birth year], MRN [id number]  PCP:   Melinda Crutch, MD  Cardiologist:  Fransico Him, MD   History of Present Illness:  Larry Arroyo is a 63 y.o. male with a hx of CAD, HTN, HL, OSA. He is s/p DES to the proximal LAD in 03/2013. He had recurrent episodes of chest pain. He ultimately underwent re-look cath 04/2013 that demonstrated a patent LAD stent.  He recently returned for f/u with PA Richardson Dopp.  At that time he continued to have a myriad of symptoms as stated in  note from 8/5. Mainly complaining of dizziness, blurred vision and dyspnea. Difficult to know what predates his PCI and what started afterward. He was complaining of significant DOE but no orthopnea, PND, edema. He had no significant change in symptoms with holding Lasix. His dizziness occurs while sitting as well as during activity. Feels lightheaded. No spinning. No syncope. On last OV he brought in a list of BPs. BP was all over the place (low of 78/55 >>> high of 165/107). Admits to compliance. Watches salt. He continued to have occasional chest pressure. He denied any chest pain with emotional stress, but he would get chest pain when he would  work. He travels for his job and is afraid to travel b/c of dizziness. He finds himself pulling over on side of road. Has told employer he can't travel right now. Chest pain predates PCI. No change after PCI. He had a relook cath for symptoms that demonstrated patent stent and no significant CAD elsewhere. He recently wore a heart monitor for dizziness which showed NSR with no arrhythmias.  He presents back today for followup.  He continues to complains of chest pain which he states is getting worse.  It can awaken him at night.  It is midsternal.  He was instructed to start Prilosec at last OV but never started it.  He still complains of dizziness and visual  changes.  He is convinced that his chest pain is still related to his heart.   He says that now it occurs with minimal exertion and cannot do much.  He continues to have significant DOE.      Wt Readings from Last 3 Encounters:  12/01/13 188 lb 3.2 oz (85.367 kg)  10/20/13 188 lb (85.276 kg)  09/28/13 190 lb (86.183 kg)     Past Medical History  Diagnosis Date  . Complication of anesthesia     16 yrs ago following hip replacement states took a long time for full memory to return  . Arthritis   . Anxiety   . Depression   . Sleep disturbance   . Coronary artery disease     nonobstructive ASCAD with 40-50% RCA, 60% D1, 70% inferior branche of D1  . Hypertension     ADDENDUM- STRESS TEST 9/12 with LOV DR Sigmund Morera ON CHART  . Hypercholesteremia   . Myocardial infarction   . Anginal pain     Current Outpatient Prescriptions  Medication Sig Dispense Refill  . amLODipine (NORVASC) 2.5 MG tablet Take 1 tablet (2.5 mg total) by mouth daily.  30 tablet  11  . aspirin EC 81 MG tablet Take 1 tablet (81 mg total) by mouth daily.      Marland Kitchen atorvastatin (LIPITOR) 10 MG tablet Take 1 tablet (10 mg total) by mouth daily.  30 tablet  6  . carvedilol (COREG) 12.5 MG tablet Take 1 tablet (12.5 mg total) by mouth 2 (two) times daily with a meal.  60 tablet  11  . citalopram (CELEXA) 40 MG tablet Take 40 mg by mouth daily.       . clopidogrel (PLAVIX) 75 MG tablet Take 1 tablet (75 mg total) by mouth daily.  30 tablet  11  . nitroGLYCERIN (NITROSTAT) 0.4 MG SL tablet Place 1 tablet (0.4 mg total) under the tongue every 5 (five) minutes as needed for chest pain.  25 tablet  3  . Psyllium (METAMUCIL) 28.3 % POWD Take 1 scoop by mouth 2 (two) times daily before a meal. To 8 oz. of juice or water      . ramipril (ALTACE) 5 MG capsule Take 1 capsule (5 mg total) by mouth 2 (two) times daily.  180 capsule  3  . tetrahydrozoline-zinc (VISINE-AC) 0.05-0.25 % ophthalmic solution Place 2 drops into both eyes 3  (three) times daily as needed. reddnes      . furosemide (LASIX) 20 MG tablet 09/28/13 PER SCOTT WEAVER, PAC HOLD LASIX UNTIL FURTHER ADVISED       No current facility-administered medications for this visit.    Allergies:   No Known Allergies  Social History:  The patient  reports that he has never smoked. He has never used smokeless tobacco. He reports that he drinks about 7.2 ounces of alcohol per week. He reports that he does not use illicit drugs.   Family History:  The patient's family history includes Epilepsy (age of onset: 21) in his brother; Stroke in his brother and another family member. There is no history of Heart attack.   ROS:  Please see the history of present illness.All other systems reviewed and negative.   PHYSICAL EXAM: VS:  BP 128/96  Pulse 75  Ht '5\' 10"'  (1.778 m)  Wt 188 lb 3.2 oz (85.367 kg)  BMI 27.00 kg/m2 Well nourished, well developed, in no acute distress HEENT: normal Neck: no JVD Cardiac:  normal S1, S2; RRR; no murmur Lungs:  clear to auscultation bilaterally, no wheezing, rhonchi or rales Abd: soft, nontender, no hepatomegaly Ext: no edema Skin: warm and dry Neuro:  CNs 2-12 intact, no focal abnormalities noted       ASSESSMENT AND PLAN:  1.Dizziness: Recent heart monitor was normal  He continues to have problems with dizziness and visual changes.  Question if anxiety playing a role. He was supposed to follow up with PCP to further manage anxiety but never followed up. 2.Chest Pain: Atypical symptoms. However, relieved by NTG. Symptoms predate PCI and have not changed since. It was recommended that he start on OTC Pepcid to see if this helps but he never did.  His symptoms can occur with exertion or when then in bed and can wake him up.  His symptoms are improved with NTG.  I am questioning whether his CP is actually GI in nature with GERD and esophageal spasm.  They occur some at night and his CP never improved after cath.  I have asked him to start  Protonix 34m daily.  He is convinced that his CP is due to CAD and his nuclear stress test was normal at the time of his cath showing LAD stenosis. We have reviewed his cath findings of both caths earlier this year at length but he remains convinced this is his heart.  At this time I have recommended that we repeat cath  since he is >6 months out and may have instent restenosis.  If cath shows patent stent then would recommend GI evaluation. 3.Dyspnea: LVEDP was high at original cath. Lasix has not changed symptoms. Holding Lasix has not helped. BNP at last OV was normal and Brilinta was changed to Plavix in case that was causing his SOB. His SOB has improved after changing to Plavix some but is still present and at time he says is severe. 4.Hypertension: Improved today but DBP still elevated. Continue Coreg/ACE I /amlodipine 5.CAD: Continue ASA/ Plavix. Continue statin.  6.Diastolic CHF: Euvolemic on exam 7.Hyperlipidemia: He has a lot of muscle pain but  CPK was normal.  Continue Lipitor 8.Anxiety: Question if all his symptoms are related to this. I have asked him to contact his PCP for earlier follow up.   I have spent a total of 35 minutes in direct patient care.  Signed, Fransico Him, MD 12/01/2013 9:55 AM

## 2013-12-02 ENCOUNTER — Encounter (HOSPITAL_COMMUNITY): Payer: Self-pay | Admitting: Pharmacy Technician

## 2013-12-02 LAB — HEPATIC FUNCTION PANEL
ALT: 39 U/L (ref 0–53)
AST: 27 U/L (ref 0–37)
Albumin: 4.1 g/dL (ref 3.5–5.2)
Alkaline Phosphatase: 88 U/L (ref 39–117)
Bilirubin, Direct: 0 mg/dL (ref 0.0–0.3)
TOTAL PROTEIN: 7.8 g/dL (ref 6.0–8.3)
Total Bilirubin: 0.5 mg/dL (ref 0.2–1.2)

## 2013-12-02 LAB — LIPID PANEL
Cholesterol: 255 mg/dL — ABNORMAL HIGH (ref 0–200)
HDL: 52 mg/dL (ref >39.00)
LDL CALC: 174 mg/dL — AB (ref 0–99)
NonHDL: 203
TRIGLYCERIDES: 143 mg/dL (ref 0.0–149.0)
Total CHOL/HDL Ratio: 5
VLDL: 28.6 mg/dL (ref 0.0–40.0)

## 2013-12-03 ENCOUNTER — Encounter (HOSPITAL_COMMUNITY)
Admission: RE | Disposition: A | Discharge status: Home / Self Care | Payer: Self-pay | Source: Ambulatory Visit | Attending: Interventional Cardiology

## 2013-12-03 ENCOUNTER — Telehealth: Payer: Self-pay | Admitting: Cardiology

## 2013-12-03 ENCOUNTER — Ambulatory Visit (HOSPITAL_COMMUNITY)
Admission: RE | Admit: 2013-12-03 | Discharge: 2013-12-03 | Disposition: A | Discharge status: Home / Self Care | Payer: 59 | Source: Ambulatory Visit | Attending: Interventional Cardiology | Admitting: Interventional Cardiology

## 2013-12-03 DIAGNOSIS — F329 Major depressive disorder, single episode, unspecified: Secondary | ICD-10-CM | POA: Diagnosis not present

## 2013-12-03 DIAGNOSIS — I5030 Unspecified diastolic (congestive) heart failure: Secondary | ICD-10-CM | POA: Insufficient documentation

## 2013-12-03 DIAGNOSIS — I209 Angina pectoris, unspecified: Secondary | ICD-10-CM

## 2013-12-03 DIAGNOSIS — Z7982 Long term (current) use of aspirin: Secondary | ICD-10-CM | POA: Diagnosis not present

## 2013-12-03 DIAGNOSIS — Z5309 Procedure and treatment not carried out because of other contraindication: Secondary | ICD-10-CM | POA: Insufficient documentation

## 2013-12-03 DIAGNOSIS — I252 Old myocardial infarction: Secondary | ICD-10-CM | POA: Diagnosis not present

## 2013-12-03 DIAGNOSIS — R079 Chest pain, unspecified: Secondary | ICD-10-CM | POA: Insufficient documentation

## 2013-12-03 DIAGNOSIS — E7800 Pure hypercholesterolemia, unspecified: Secondary | ICD-10-CM | POA: Diagnosis not present

## 2013-12-03 DIAGNOSIS — I10 Essential (primary) hypertension: Secondary | ICD-10-CM | POA: Insufficient documentation

## 2013-12-03 DIAGNOSIS — Z7902 Long term (current) use of antithrombotics/antiplatelets: Secondary | ICD-10-CM | POA: Diagnosis not present

## 2013-12-03 DIAGNOSIS — R42 Dizziness and giddiness: Secondary | ICD-10-CM | POA: Insufficient documentation

## 2013-12-03 DIAGNOSIS — I2510 Atherosclerotic heart disease of native coronary artery without angina pectoris: Secondary | ICD-10-CM | POA: Diagnosis not present

## 2013-12-03 DIAGNOSIS — G4733 Obstructive sleep apnea (adult) (pediatric): Secondary | ICD-10-CM | POA: Insufficient documentation

## 2013-12-03 DIAGNOSIS — F411 Generalized anxiety disorder: Secondary | ICD-10-CM | POA: Insufficient documentation

## 2013-12-03 DIAGNOSIS — Z9861 Coronary angioplasty status: Secondary | ICD-10-CM | POA: Diagnosis not present

## 2013-12-03 DIAGNOSIS — F3289 Other specified depressive episodes: Secondary | ICD-10-CM | POA: Insufficient documentation

## 2013-12-03 DIAGNOSIS — E785 Hyperlipidemia, unspecified: Secondary | ICD-10-CM | POA: Diagnosis not present

## 2013-12-03 DIAGNOSIS — I25119 Atherosclerotic heart disease of native coronary artery with unspecified angina pectoris: Secondary | ICD-10-CM

## 2013-12-03 HISTORY — PX: LEFT HEART CATHETERIZATION WITH CORONARY ANGIOGRAM: SHX5451

## 2013-12-03 LAB — BASIC METABOLIC PANEL
ANION GAP: 12 (ref 5–15)
BUN: 22 mg/dL (ref 6–23)
CHLORIDE: 101 meq/L (ref 96–112)
CO2: 23 mEq/L (ref 19–32)
Calcium: 9.2 mg/dL (ref 8.4–10.5)
Creatinine, Ser: 1.12 mg/dL (ref 0.50–1.35)
GFR calc non Af Amer: 68 mL/min — ABNORMAL LOW (ref >90)
GFR, EST AFRICAN AMERICAN: 78 mL/min — AB (ref >90)
Glucose, Bld: 116 mg/dL — ABNORMAL HIGH (ref 70–99)
POTASSIUM: 5.5 meq/L — AB (ref 3.7–5.3)
Sodium: 136 mEq/L — ABNORMAL LOW (ref 137–147)

## 2013-12-03 LAB — CBC
HCT: 41.4 % (ref 39.0–52.0)
HEMOGLOBIN: 13.6 g/dL (ref 13.0–17.0)
MCH: 29.8 pg (ref 26.0–34.0)
MCHC: 32.9 g/dL (ref 30.0–36.0)
MCV: 90.8 fL (ref 78.0–100.0)
Platelets: 203 10*3/uL (ref 150–400)
RBC: 4.56 MIL/uL (ref 4.22–5.81)
RDW: 14.1 % (ref 11.5–15.5)
WBC: 5.7 10*3/uL (ref 4.0–10.5)

## 2013-12-03 LAB — PLATELET INHIBITION P2Y12: PLATELET FUNCTION P2Y12: 147 [PRU] — AB (ref 194–418)

## 2013-12-03 LAB — POCT ACTIVATED CLOTTING TIME
ACTIVATED CLOTTING TIME: 270 s
Activated Clotting Time: 247 seconds
Activated Clotting Time: 264 seconds

## 2013-12-03 LAB — PROTIME-INR
INR: 0.95 (ref 0.00–1.49)
PROTHROMBIN TIME: 12.7 s (ref 11.6–15.2)

## 2013-12-03 SURGERY — LEFT HEART CATHETERIZATION WITH CORONARY ANGIOGRAM
Anesthesia: LOCAL

## 2013-12-03 MED ORDER — HEPARIN SODIUM (PORCINE) 1000 UNIT/ML IJ SOLN
INTRAMUSCULAR | Status: AC
  Filled 2013-12-03: qty 1

## 2013-12-03 MED ORDER — ACETAMINOPHEN 325 MG PO TABS
650.0000 mg | ORAL_TABLET | Freq: Once | ORAL | Status: AC
  Administered 2013-12-03: 650 mg via ORAL

## 2013-12-03 MED ORDER — NITROGLYCERIN 1 MG/10 ML FOR IR/CATH LAB
INTRA_ARTERIAL | Status: AC
  Filled 2013-12-03: qty 10

## 2013-12-03 MED ORDER — FENTANYL CITRATE 0.05 MG/ML IJ SOLN
INTRAMUSCULAR | Status: AC
  Filled 2013-12-03: qty 2

## 2013-12-03 MED ORDER — MIDAZOLAM HCL 2 MG/2ML IJ SOLN
INTRAMUSCULAR | Status: AC
  Filled 2013-12-03: qty 2

## 2013-12-03 MED ORDER — SODIUM CHLORIDE 0.9 % IV SOLN: 250.0000 mL | INTRAVENOUS | Status: DC | PRN

## 2013-12-03 MED ORDER — MIDAZOLAM HCL 2 MG/2ML IJ SOLN
INTRAMUSCULAR | Status: AC
Start: 2013-12-03 — End: 2013-12-03
  Filled 2013-12-03: qty 2

## 2013-12-03 MED ORDER — HEPARIN (PORCINE) IN NACL 2-0.9 UNIT/ML-% IJ SOLN
INTRAMUSCULAR | Status: AC
Start: 2013-12-03 — End: 2013-12-03
  Filled 2013-12-03: qty 1000

## 2013-12-03 MED ORDER — SODIUM CHLORIDE 0.9 % IJ SOLN: 3.0000 mL | Freq: Two times a day (BID) | INTRAMUSCULAR | Status: DC

## 2013-12-03 MED ORDER — ASPIRIN 81 MG PO CHEW
81.0000 mg | CHEWABLE_TABLET | ORAL | Status: AC
  Administered 2013-12-03: 81 mg via ORAL

## 2013-12-03 MED ORDER — SODIUM CHLORIDE 0.9 % IV SOLN: 1.0000 mL/kg/h | INTRAVENOUS | Status: DC

## 2013-12-03 MED ORDER — SODIUM CHLORIDE 0.9 % IV SOLN
Freq: Once | INTRAVENOUS | Status: AC
  Administered 2013-12-03: 13:00:00 via INTRAVENOUS

## 2013-12-03 MED ORDER — ASPIRIN 81 MG PO CHEW
CHEWABLE_TABLET | ORAL | Status: AC
  Filled 2013-12-03: qty 1

## 2013-12-03 MED ORDER — ADENOSINE 12 MG/4ML IV SOLN
16.0000 mL | Freq: Once | INTRAVENOUS | Status: DC
  Filled 2013-12-03: qty 16

## 2013-12-03 MED ORDER — SODIUM CHLORIDE 0.9 % IJ SOLN: 3.0000 mL | INTRAMUSCULAR | Status: DC | PRN

## 2013-12-03 MED ORDER — VERAPAMIL HCL 2.5 MG/ML IV SOLN
INTRAVENOUS | Status: AC
Start: 2013-12-03 — End: 2013-12-03
  Filled 2013-12-03: qty 2

## 2013-12-03 MED ORDER — LIDOCAINE HCL (PF) 1 % IJ SOLN
INTRAMUSCULAR | Status: AC
  Filled 2013-12-03: qty 30

## 2013-12-03 MED ORDER — SODIUM CHLORIDE 0.9 % IV SOLN
INTRAVENOUS | Status: DC
  Administered 2013-12-03: 08:00:00 via INTRAVENOUS

## 2013-12-03 MED ORDER — ADENOSINE 12 MG/4ML IV SOLN
16.0000 mL | Freq: Once | INTRAVENOUS | Status: DC
Start: 2013-12-03 — End: 2013-12-03
  Filled 2013-12-03 (×2): qty 16

## 2013-12-03 MED ORDER — ACETAMINOPHEN 325 MG PO TABS
ORAL_TABLET | ORAL | Status: AC
  Administered 2013-12-03: 650 mg via ORAL
  Filled 2013-12-03: qty 2

## 2013-12-03 NOTE — Telephone Encounter (Signed)
Patient just had cath showing patent LAD stent with mild to moderate RCA disease (unable to pass a flow wire due to tortuosity) and mild to moderate disease of left circ not significant by flow wire.  Please set up for stress myoview to rule out ischemia in the inferior wall

## 2013-12-03 NOTE — Interval H&P Note (Signed)
Cath Lab Visit (complete for each Cath Lab visit)  Clinical Evaluation Leading to the Procedure:   ACS: No.  Non-ACS:    Anginal Classification: CCS III  Anti-ischemic medical therapy: Maximal Therapy (2 or more classes of medications)  Non-Invasive Test Results: No non-invasive testing performed  Prior CABG: No previous CABG      History and Physical Interval Note:  12/03/2013 9:00 AM  Larry Arroyo  has presented today for surgery, with the diagnosis of cp  The various methods of treatment have been discussed with the patient and family. After consideration of risks, benefits and other options for treatment, the patient has consented to  Procedure(s): LEFT HEART CATHETERIZATION WITH CORONARY ANGIOGRAM (N/A) as a surgical intervention .  The patient's history has been reviewed, patient examined, no change in status, stable for surgery.  I have reviewed the patient's chart and labs.  Questions were answered to the patient's satisfaction.     Davey Bergsma S.

## 2013-12-03 NOTE — CV Procedure (Signed)
PROCEDURE:  Left heart catheterization with selective coronary angiography, left ventriculogram.  FFR Circumflex; Attempted FFR of the RCA.  Right upper extremity angiogram.  INDICATIONS:  Angina  The risks, benefits, and details of the procedure were explained to the patient.  The patient verbalized understanding and wanted to proceed.  Informed written consent was obtained.  PROCEDURE TECHNIQUE:  After Xylocaine anesthesia a 61F slender sheath was placed in the right radial artery with a single anterior needle wall stick.   Right coronary angiography was done using a Williams right guide catheter.  Left coronary angiography was done using a Judkins L3.5 guide catheter.  Left ventriculography was done using a pigtail catheter.  FFR of the circumflex was performed using a EBU 35 [place] guide. The RCA was best engaged with an AL-1 catheter during the attempted FFR. A TR band was used for hemostasis.   CONTRAST:  Total of 100 cc.  COMPLICATIONS:  None.    HEMODYNAMICS:  Aortic pressure was 98/68; LV pressure was 99/7; LVEDP 12.  There was no gradient between the left ventricle and aorta.    ANGIOGRAPHIC DATA:   The left main coronary artery is a short vessel, but patent.  The left anterior descending artery is a large vessel which wraps around the apex. The stents in the proximal to mid vessel is widely patent there several diagonal vessels.  The first diagonal is small and has a 80% proximal stenosis. The second diagonal is medium-sized and has mild proximal disease.  The left circumflex artery is a medium size vessel. At the origin of the first obtuse marginal, there is a 50-60 stenosis.  The first obtuse marginal is patent. The second obtuse marginal is also widely patent. The third obtuse marginal is patent as well.  The right coronary artery is a large, dominant vessel with a high anterior takeoff. The vessel is extremely tortuous with a shepherd's crux. There is mild to moderate mid  vessel disease. The posterior descending artery is medium-sized and patent. The posterior lateral artery is medium-sized and patent. There is mild RCA disease just before the crux.  Right upper extremity: The JR 4 catheter was used to visualize the right forearm vasculature with hand injection of contrast. There was vasospasm noted. There is also a air bubble noted just at the tip of the catheter during this picture. The catheter was well aspirated prior to any further angiography.  LEFT VENTRICULOGRAM:  Left ventricular angiogram was done in the 30 RAO projection and revealed normal left ventricular wall motion and systolic function with an estimated ejection fraction of 55 %.  LVEDP was 12 mmHg.  INTERVENTIONAL NARRATIVE: An EBU 3 catheter was used to engage the left main.  The pressure wire was advanced and normalized at the tip of the guide. Across the lesion in the circumflex, resting FFR was 0.98. After maximal hyperemia with IV adenosine, the FFR was 0.85. This was not significant.  Attention was turned to the RCA. Initially, a Williams right guide catheter was used to try to engage the RCA. It was not quite selected. The wire was advanced to the mid vessel but would not cross the area of disease in mid RCA due to severe tortuosity. An AL-1 guide eventually was used to engage the RCA. The wire was advanced but again would not cross the area of severe tortuosity in the distal RCA. The equipment was removed. Final angiography showed no change and the vessel architecture.  IMPRESSIONS:  1. Normal left main coronary artery. 2. Patent stent in the proximal to mid left anterior descending artery. 3. Moderate disease in the mid left circumflex artery which is not significant by FFR. 0.85 with adenosine. 4. Mild to moderate mid vessel disease in the right coronary artery.  Due to severe tortuosity and high anterior takeoff, unable to wire this vessel with an FFR wire. 5. Normal left ventricular  systolic function.  LVEDP 12 mmHg.  Ejection fraction 55%.  RECOMMENDATION:  No severe stenosis noted by angiography that would explain the patient's symptoms. Consider stress testing if symptoms continue to evaluate the inferior wall. If repeat catheterization as needed, would suggest right femoral approach to hopefully help with guide support in the RCA.  The patient will followup with Dr. Radford Pax.

## 2013-12-03 NOTE — H&P (View-Only) (Signed)
[address], [place], [place]  [address] Phone: [phone] Fax:  [phone]  Date:  12/01/2013   ID:  Larry Arroyo, DOB Jul 17, 1950, MRN [id number]  PCP:   Melinda Crutch, MD  Cardiologist:  Fransico Him, MD     History of Present Illness: Larry Arroyo is a 63 y.o. male with a hx of CAD, HTN, HL, OSA. He is s/p DES to the proximal LAD in 03/2013. He had recurrent episodes of chest pain. He ultimately underwent re-look cath 04/2013 that demonstrated a patent LAD stent.  He recently returned for f/u with PA Richardson Dopp.  At that time he continued to have a myriad of symptoms as stated in  note from 8/5. Mainly complaining of dizziness, blurred vision and dyspnea. Difficult to know what predates his PCI and what started afterward. He was complaining of significant DOE but no orthopnea, PND, edema. He had no significant change in symptoms with holding Lasix. His dizziness occurs while sitting as well as during activity. Feels lightheaded. No spinning. No syncope. On last OV he brought in a list of BPs. BP was all over the place (low of 78/55 >>> high of 165/107). Admits to compliance. Watches salt. He continued to have occasional chest pressure. He denied any chest pain with emotional stress, but he would get chest pain when he would  work. He travels for his job and is afraid to travel b/c of dizziness. He finds himself pulling over on side of road. Has told employer he can't travel right now. Chest pain predates PCI. No change after PCI. He had a relook cath for symptoms that demonstrated patent stent and no significant CAD elsewhere. He recently wore a heart monitor for dizziness which showed NSR with no arrhythmias.  He presents back today for followup.  He continues to complains of chest pain which he states is getting worse.  It can awaken him at night.  It is midsternal.  He was instructed to start Prilosec at last OV but never started it.  He still complains of dizziness and visual  changes.  He is convinced that his chest pain is still related to his heart.   He says that now it occurs with minimal exertion and cannot do much.  He continues to have significant DOE.      Wt Readings from Last 3 Encounters:  12/01/13 188 lb 3.2 oz (85.367 kg)  10/20/13 188 lb (85.276 kg)  09/28/13 190 lb (86.183 kg)     Past Medical History  Diagnosis Date  . Complication of anesthesia     16 yrs ago following hip replacement states took a long time for full memory to return  . Arthritis   . Anxiety   . Depression   . Sleep disturbance   . Coronary artery disease     nonobstructive ASCAD with 40-50% RCA, 60% D1, 70% inferior branche of D1  . Hypertension     ADDENDUM- STRESS TEST 9/12 with LOV DR Marshia Tropea ON CHART  . Hypercholesteremia   . Myocardial infarction   . Anginal pain     Current Outpatient Prescriptions  Medication Sig Dispense Refill  . amLODipine (NORVASC) 2.5 MG tablet Take 1 tablet (2.5 mg total) by mouth daily.  30 tablet  11  . aspirin EC 81 MG tablet Take 1 tablet (81 mg total) by mouth daily.      Marland Kitchen atorvastatin (LIPITOR) 10 MG tablet Take 1 tablet (10 mg total) by mouth daily.  30 tablet  6  . carvedilol (COREG) 12.5 MG tablet Take 1 tablet (12.5 mg total) by mouth 2 (two) times daily with a meal.  60 tablet  11  . citalopram (CELEXA) 40 MG tablet Take 40 mg by mouth daily.       . clopidogrel (PLAVIX) 75 MG tablet Take 1 tablet (75 mg total) by mouth daily.  30 tablet  11  . nitroGLYCERIN (NITROSTAT) 0.4 MG SL tablet Place 1 tablet (0.4 mg total) under the tongue every 5 (five) minutes as needed for chest pain.  25 tablet  3  . Psyllium (METAMUCIL) 28.3 % POWD Take 1 scoop by mouth 2 (two) times daily before a meal. To 8 oz. of juice or water      . ramipril (ALTACE) 5 MG capsule Take 1 capsule (5 mg total) by mouth 2 (two) times daily.  180 capsule  3  . tetrahydrozoline-zinc (VISINE-AC) 0.05-0.25 % ophthalmic solution Place 2 drops into both eyes 3  (three) times daily as needed. reddnes      . furosemide (LASIX) 20 MG tablet 09/28/13 PER [facility], PAC HOLD LASIX UNTIL FURTHER ADVISED       No current facility-administered medications for this visit.    Allergies:   No Known Allergies  Social History:  The patient  reports that he has never smoked. He has never used smokeless tobacco. He reports that he drinks about 7.2 ounces of alcohol per week. He reports that he does not use illicit drugs.   Family History:  The patient's family history includes Epilepsy (age of onset: 91) in his brother; Stroke in his brother and another family member. There is no history of Heart attack.   ROS:  Please see the history of present illness.      All other systems reviewed and negative.   PHYSICAL EXAM: VS:  BP 128/96  Pulse 75  Ht _0  (1.778 m)  Wt 188 lb 3.2 oz (85.367 kg)  BMI 27.00 kg/m2 Well nourished, well developed, in no acute distress HEENT: normal Neck: no JVD Cardiac:  normal S1, S2; RRR; no murmur Lungs:  clear to auscultation bilaterally, no wheezing, rhonchi or rales Abd: soft, nontender, no hepatomegaly Ext: no edema Skin: warm and dry Neuro:  CNs 2-12 intact, no focal abnormalities noted       ASSESSMENT AND PLAN:  Dizziness: Recent heart monitor was normal  He continues to have problems with dizziness and visual changes.  Question if anxiety playing a role. He was supposed to follow up with PCP to further manage anxiety but never followed up. Chest Pain: Atypical symptoms. However, relieved by NTG. Symptoms predate PCI and have not changed since. It was recommended that he start on OTC Pepcid to see if this helps but he never did.  His symptoms can occur with exertion or when then in bed and can wake him up.  His symptoms are improved with NTG.  I am questioning whether his CP is actually GI in nature with GERD and esophageal spasm.  They occur some at night and his CP never improved after cath.  I have asked him to  start Protonix 77m daily.  He is convinced that his CP is due to CAD and his nuclear stress test was normal at the time of his cath showing LAD stenosis.  At this time I have recommended that we repeat cath since he is >6 months out and may have instent restenosis.  If cath shows patent  stent then would recommend GI evaluation. Dyspnea: LVEDP was high at original cath. Lasix has not changed symptoms. Holding Lasix has not helped. BNP at last OV was normal and Brilinta was changed to Plavix in case that was causing his SOB. His SOB has improved after changing to Plavix some but is still present and at time he says is severe. Hypertension: Improved today but DBP still elevated. Continue Coreg/ACE I /amlodipine CAD: Continue ASA/ Plavix. Continue statin.  Diastolic CHF: Euvolemic on exam Hyperlipidemia: He has a lot of muscle pain but  CPK was normal.  Continue Lipitor Anxiety: Question if all his symptoms are related to this. I have asked him to contact his PCP for earlier follow up.   Signed, Fransico Him, MD 12/01/2013 9:55 AM

## 2013-12-03 NOTE — Progress Notes (Signed)
Pt's BP and Headache reported to Dr. Irish Lack, new orders received and carried out.

## 2013-12-03 NOTE — Discharge Instructions (Addendum)
Radial Site Care °Refer to this sheet in the next few weeks. These instructions provide you with information on caring for yourself after your procedure. Your caregiver may also give you more specific instructions. Your treatment has been planned according to current medical practices, but problems sometimes occur. Call your caregiver if you have any problems or questions after your procedure. °HOME CARE INSTRUCTIONS °· You may shower the day after the procedure. Remove the bandage (dressing) and gently wash the site with plain soap and water. Gently pat the site dry. °· Do not apply powder or lotion to the site. °· Do not submerge the affected site in water for 3 to 5 days. °· Inspect the site at least twice daily. °· Do not flex or bend the affected arm for 24 hours. °· No lifting over 5 pounds (2.3 kg) for 5 days after your procedure. °· Do not drive home if you are discharged the same day of the procedure. Have someone else drive you. °· You may drive 24 hours after the procedure unless otherwise instructed by your caregiver. °· Do not operate machinery or power tools for 24 hours. °· A responsible adult should be with you for the first 24 hours after you arrive home. °What to expect: °· Any bruising will usually fade within 1 to 2 weeks. °· Blood that collects in the tissue (hematoma) may be painful to the touch. It should usually decrease in size and tenderness within 1 to 2 weeks. °SEEK IMMEDIATE MEDICAL CARE IF: °· You have unusual pain at the radial site. °· You have redness, warmth, swelling, or pain at the radial site. °· You have drainage (other than a small amount of blood on the dressing). °· You have chills. °· You have a fever or persistent symptoms for more than 72 hours. °· You have a fever and your symptoms suddenly get worse. °· Your arm becomes pale, cool, tingly, or numb. °· You have heavy bleeding from the site. Hold pressure on the site. °Document Released: 04/06/2010 Document Revised:  05/27/2011 Document Reviewed: 04/06/2010 °ExitCare® Patient Information ©2015 [employer]. This information is not intended to replace advice given to you by your health care provider. Make sure you discuss any questions you have with your health care provider. ° °

## 2013-12-03 NOTE — Addendum Note (Signed)
Addended by: Lily Kocher on: 12/03/2013 11:20 AM   Modules accepted: Orders

## 2013-12-03 NOTE — Telephone Encounter (Signed)
Order placed for Stress test. Scheduling will schedule for pt.

## 2013-12-07 ENCOUNTER — Ambulatory Visit (HOSPITAL_COMMUNITY): Payer: 59 | Attending: Cardiology | Admitting: Radiology

## 2013-12-07 VITALS — BP 158/104 | Ht 70.0 in | Wt 181.0 lb

## 2013-12-07 DIAGNOSIS — I10 Essential (primary) hypertension: Secondary | ICD-10-CM | POA: Insufficient documentation

## 2013-12-07 DIAGNOSIS — I2510 Atherosclerotic heart disease of native coronary artery without angina pectoris: Secondary | ICD-10-CM

## 2013-12-07 DIAGNOSIS — R079 Chest pain, unspecified: Secondary | ICD-10-CM | POA: Diagnosis present

## 2013-12-07 DIAGNOSIS — R42 Dizziness and giddiness: Secondary | ICD-10-CM | POA: Diagnosis not present

## 2013-12-07 MED ORDER — TECHNETIUM TC 99M SESTAMIBI GENERIC - CARDIOLITE
30.0000 | Freq: Once | INTRAVENOUS | Status: AC | PRN
  Administered 2013-12-07: 30 via INTRAVENOUS

## 2013-12-07 MED ORDER — REGADENOSON 0.4 MG/5ML IV SOLN
0.4000 mg | Freq: Once | INTRAVENOUS | Status: AC
  Administered 2013-12-07: 0.4 mg via INTRAVENOUS

## 2013-12-07 MED ORDER — TECHNETIUM TC 99M SESTAMIBI GENERIC - CARDIOLITE
10.0000 | Freq: Once | INTRAVENOUS | Status: AC | PRN
  Administered 2013-12-07: 10 via INTRAVENOUS

## 2013-12-07 NOTE — Progress Notes (Signed)
[facility] [address] [place], [place] [address] [phone]    Cardiology Nuclear Med Study  Larry Arroyo is a 63 y.o. male     MRN : [id number]     DOB: 1950-06-02  Procedure Date: 12/07/2013  Nuclear Med Background Indication for Stress Test:  Evaluation for Ischemia and Follow-Up CAD: Cath 12-03-2013: Assess Inferior Wall> mild to moderate RCA that had severe tortuosity, unable to wire this vessel, and  patent stent LAD, moderate disease CFX History:  01/15 MPI: EF: 56% Low Risk Cardiac Risk Factors: Hypertension  Symptoms:  Chest Pain and Dizziness   Nuclear Pre-Procedure Caffeine/Decaff Intake:  None> 12 hrs NPO After: 11:00pm   Lungs:  clear O2 Sat: 97% on room air. IV 0.9% NS with Angio Cath:  22g  IV Site: R Wrist x 1, tolerated well IV Started by:  Irven Baltimore, RN  Chest Size (in):  44 Cup Size: n/a  Height: 5\' 10"  (1.778 m)  Weight:  181 lb (82.101 kg)  BMI:  Body mass index is 25.97 kg/(m^2). Tech Comments:  Patient held Coreg x 24 hrs, and took Altace this am. Irven Baltimore, RN.    Nuclear Med Study 1 or 2 day study: 1 day  Stress Test Type:  Lexiscan  Reading MD: N/A  Order Authorizing Provider:  Fransico Him, MD  Resting Radionuclide: Technetium 85m Sestamibi  Resting Radionuclide Dose: 11.0 mCi   Stress Radionuclide:  Technetium 25m Sestamibi  Stress Radionuclide Dose: 33.0 mCi           Stress Protocol Rest HR: 93 Stress HR: 110  Rest BP: 158/104 Stress BP: 145/96  Exercise Time (min): n/a METS: n/a   Predicted Max HR: 156 bpm % Max HR: 70.51 bpm Rate Pressure Product: 17380   Dose of Adenosine (mg):  n/a Dose of Lexiscan: 0.4 mg  Dose of Atropine (mg): n/a Dose of Dobutamine: n/a mcg/kg/min (at max HR)  Stress Test Technologist: Perrin Maltese, EMT-P  Nuclear Technologist:  Vedia Pereyra, CNMT     Rest Procedure:  Myocardial perfusion imaging was performed at rest 45 minutes following the intravenous  administration of Technetium 9m Sestamibi. Rest ECG: NSR with non-specific ST-T wave changes  Stress Procedure:  The patient received IV Lexiscan 0.4 mg over 15-seconds.  Technetium 46m Sestamibi injected at 30-seconds. This patient had throat tightness, was flushed, anxious, and had nausea with the Lexiscan injection. Quantitative spect images were obtained after a 45 minute delay. Stress ECG: No significant change from baseline ECG  QPS Raw Data Images:  Normal; no motion artifact; normal heart/lung ratio. Stress Images:  There is a small area of mild decreased uptake seen along the apex/apical inferior wall worse at stress than at rest consistent with mild ischemia. Otherwise, homogeneous radiotracer uptake at both rest and stress. Rest Images:  As described above. Subtraction (SDS):  Mild ischemia, sum difference score of 2 Transient Ischemic Dilatation (Normal <1.22):  1.20 Lung/Heart Ratio (Normal <0.45):  0.30  Quantitative Gated Spect Images QGS EDV:  101 ml QGS ESV:  48 ml  Impression Exercise Capacity:  Lexiscan with no exercise. BP Response:  Normal blood pressure response. Clinical Symptoms:  Typical symptoms with pharmacologic infusion ECG Impression:  No significant ST segment change suggestive of ischemia. Comparison with Prior Nuclear Study: No images to compare  Overall Impression:  Low risk stress nuclear study with a small area of mild ischemia noted along the apical inferior distribution..  LV Ejection  Fraction: 53%.  LV Wall Motion:  NL LV Function; NL Wall Motion  Candee Furbish, MD

## 2013-12-09 ENCOUNTER — Other Ambulatory Visit: Payer: Self-pay

## 2013-12-09 ENCOUNTER — Encounter: Payer: Self-pay | Admitting: Physician Assistant

## 2013-12-09 DIAGNOSIS — K224 Dyskinesia of esophagus: Secondary | ICD-10-CM

## 2013-12-09 MED ORDER — ISOSORBIDE MONONITRATE ER 30 MG PO TB24: 30.0000 mg | ORAL_TABLET | Freq: Every day | ORAL | Status: DC

## 2013-12-10 ENCOUNTER — Telehealth: Payer: Self-pay | Admitting: Cardiology

## 2013-12-10 NOTE — Telephone Encounter (Signed)
°  Patient has cath done last Friday and has questions regarding medication. Please call and advise.

## 2013-12-10 NOTE — Telephone Encounter (Signed)
Spoke with pt and cleared up for pt.

## 2013-12-21 ENCOUNTER — Ambulatory Visit: Payer: 59 | Admitting: Physician Assistant

## 2013-12-22 ENCOUNTER — Encounter: Payer: Self-pay | Admitting: *Deleted

## 2013-12-23 ENCOUNTER — Ambulatory Visit (INDEPENDENT_AMBULATORY_CARE_PROVIDER_SITE_OTHER): Payer: 59 | Admitting: Physician Assistant

## 2013-12-23 ENCOUNTER — Encounter: Payer: Self-pay | Admitting: Physician Assistant

## 2013-12-23 VITALS — BP 134/92 | HR 76 | Ht 70.5 in | Wt 189.2 lb

## 2013-12-23 DIAGNOSIS — R079 Chest pain, unspecified: Secondary | ICD-10-CM

## 2013-12-23 NOTE — Progress Notes (Signed)
Subjective:    Patient ID: Larry Arroyo, male    DOB: 05-21-1950, 63 y.o.   MRN: [id number]  HPI  Larry Arroyo  is a pleasant 63 year old white male known to Dr. stark from prior colonoscopy. He had routine screening done in November of 2011 and had one small polyp removed from the ascending colon this was a tubular adenoma and was also noted to have moderate diverticulosis. He is currently being referred by Dr. Olivia Mackie Turner/cardiology for evaluation of chest pain and question of esophageal spasm. Patient has history of coronary artery disease male sleep apnea. He is status post DES to the proximal LAD in January of 2015 and had been maintained on Plavix. He has been having recurrent episodes of chest pain. He had a relook catheter done in February 2015 that showed a patent LAD stent. Over the past couple of months she has had some recurrent episodes of chest pain. He states one of these happened while he was walking his dog and he developed a "tightness in his chest which was relieved with a nitroglycerin. He then had 2 episodes of nocturnal chest pain both waking him from sleep. He says first wasn't bad but felt like pressure and was relieved with nitroglycerin. Second episode of happened while he was at the beach by himself and he says this was a significant event and it felt like a heart attack "". He says he developed a heaviness like a truck sitting on his chest. He took 1 nitroglycerin which helped but did not resolve the pain and then a second nitroglycerin which did resolve the pain. He was then admitted to the hospital on September 16 for catheterization. He was found to have a patent stent to the proximal LAD but moderate disease in the right coronary artery followed up however with severe tortuosity and high takeoff unable to wire this vessel. He then had a stress Myoview done on 922 which showed a low risk nuclear study with a small area of ischemia noted along the apical inferior distribution and  an EF of 53%. He has since been started on Imdur 30 mg by mouth daily. He was referred to [place] to assure no esophageal spasm responsible for the nocturnal episodes. Patient states since being on Imdur  he has not had any further chest pain. He has been having occasional episodes of dizziness. He says he has never had any problems with heartburn, indigestion or acid reflux. He was placed on Protonix recently which he is taking. He says generally he can eat whatever  he wants to and doesn't have problems. He specifically denies any abdominal pain or dysphagia. He specifically describes the nocturnal episodes as a heavy pressure sensation.     Review of Systems  Constitutional: Positive for fatigue.  HENT: Negative.   Eyes: Negative.   Cardiovascular: Positive for chest pain.  Gastrointestinal: Negative.   Endocrine: Negative.   Genitourinary: Negative.   Musculoskeletal: Negative.   Skin: Negative.   Allergic/Immunologic: Negative.   Neurological: Negative.   Hematological: Negative.   Psychiatric/Behavioral: Negative.    Outpatient Prescriptions Prior to Visit  Medication Sig Dispense Refill  . amLODipine (NORVASC) 2.5 MG tablet Take 1 tablet (2.5 mg total) by mouth daily.  30 tablet  11  . aspirin EC 81 MG tablet Take 1 tablet (81 mg total) by mouth daily.      Marland Kitchen atorvastatin (LIPITOR) 10 MG tablet Take 1 tablet (10 mg total) by mouth daily.  30 tablet  6  .  carvedilol (COREG) 12.5 MG tablet Take 1 tablet (12.5 mg total) by mouth 2 (two) times daily with a meal.  60 tablet  11  . citalopram (CELEXA) 40 MG tablet Take 60 mg by mouth daily.       . clopidogrel (PLAVIX) 75 MG tablet Take 1 tablet (75 mg total) by mouth daily.  30 tablet  11  . isosorbide mononitrate (IMDUR) 30 MG 24 hr tablet Take 1 tablet (30 mg total) by mouth daily.  90 tablet  3  . nitroGLYCERIN (NITROSTAT) 0.4 MG SL tablet Place 1 tablet (0.4 mg total) under the tongue every 5 (five) minutes as needed for chest pain.   25 tablet  3  . pantoprazole (PROTONIX) 40 MG tablet Take 1 tablet (40 mg total) by mouth daily.  30 tablet  6  . Psyllium (METAMUCIL) 28.3 % POWD Take 1 scoop by mouth 2 (two) times daily before a meal. To 8 oz. of juice or water      . ramipril (ALTACE) 5 MG capsule Take 1 capsule (5 mg total) by mouth 2 (two) times daily.  180 capsule  3  . tetrahydrozoline-zinc (VISINE-AC) 0.05-0.25 % ophthalmic solution Place 2 drops into both eyes 3 (three) times daily as needed (redness).        No facility-administered medications prior to visit.   No Known Allergies Patient Active Problem List   Diagnosis Date Noted  . Dizziness 12/01/2013  . Chronic diastolic CHF (congestive heart failure) 12/01/2013  . H/O class III angina pectoris 04/13/2013  . Hypersomnia 03/24/2013  . Chest pain 03/24/2013  . Coronary artery disease   . Hypertension   . Hypercholesteremia   . Hyponatremia 02/12/2011  . Osteoarthritis of right hip 02/12/2011   History  Substance Use Topics  . Smoking status: Never Smoker   . Smokeless tobacco: Never Used  . Alcohol Use: 7.2 oz/week    12 Cans of beer per week   family history includes Aneurysm in his brother; Epilepsy (age of onset: 29) in his brother; Stroke in his brother. There is no history of Heart attack.     Objective:   Physical Exam  well-developed white male in no acute distress, pleasant blood pressure 134/92 pulse 76 height 510 weight 189. HEENT; nontraumatic normocephalic EOMI PERRLA sclera anicteric, Neck ;supple no JVD, Cardiovascular; regular rate and rhythm with S1-S2 no murmur or gallop, Pulmonary; clear bilaterally, Abdomen ;soft nontender nondistended bowel sounds are active there is no palpable mass or hepatosplenomegaly, Rectal; exam not done, Ext;  no clubbing cyanosis or edema skin warm and dry, Psych; mood and affect appropriate        Assessment & Plan:  #66  63 year old male with known coronary artery disease status post LAD DES January  2015. Patient with recurrent episodes of chest pain 2 of these occurring at night waking him from sleep and relieved by nitroglycerin. No recurrent chest pain since starting Imdur  Recent catheter showed moderate disease in the right coronary artery and stress Myoview showed a small area of ischemia. I think it is unlikely his episodes of chest pain are related to her esophageal spasm. Suspect this was anginal pain  Am hesitant to put him through an aggressive GI workup i.e. EGD and manometry as feel l this would be low yield.  #2 diverticulosis  #3 history of tubular adenomatous polyps due for colonoscopy November 2016  #4 chronic antiplatelet therapy  #5 CHF   Plan ; patient can continue Protonix 40 mg  by mouth daily  Will schedule for barium swallow to rule out any obvious motility issues or spasm. He has followup appointment with cardiology next week

## 2013-12-23 NOTE — Progress Notes (Signed)
Reviewed and agree with management plan.  Malcolm T. Stark, MD FACG 

## 2013-12-23 NOTE — Patient Instructions (Signed)
Continue the Protonix 40 mg daily. You have been scheduled for a Barium Esophogram at [facility] (1st floor of the hospital) on Tuesday  at 12-28-2013 Please arrive at 11:15 Am  prior to your appointment for registration. Make certain not to have anything to eat or drink 3  hours prior to your test. If you need to reschedule for any reason, please contact radiology at [phone] to do so. __________________________________________________________________ A barium swallow is an examination that concentrates on views of the esophagus. This tends to be a double contrast exam (barium and two liquids which, when combined, create a gas to distend the wall of the oesophagus) or single contrast (non-ionic iodine based). The study is usually tailored to your symptoms so a good history is essential. Attention is paid during the study to the form, structure and configuration of the esophagus, looking for functional disorders (such as aspiration, dysphagia, achalasia, motility and reflux) EXAMINATION You may be asked to change into a gown, depending on the type of swallow being performed. A radiologist and radiographer will perform the procedure. The radiologist will advise you of the type of contrast selected for your procedure and direct you during the exam. You will be asked to stand, sit or lie in several different positions and to hold a small amount of fluid in your mouth before being asked to swallow while the imaging is performed .In some instances you may be asked to swallow barium coated marshmallows to assess the motility of a solid food bolus. The exam can be recorded as a digital or video fluoroscopy procedure. POST PROCEDURE It will take 1-2 days for the barium to pass through your system. To facilitate this, it is important, unless otherwise directed, to increase your fluids for the next 24-48hrs and to resume your normal diet.  This test typically takes about 30 minutes to  perform. __________________________________________________________________________________

## 2013-12-28 ENCOUNTER — Ambulatory Visit (INDEPENDENT_AMBULATORY_CARE_PROVIDER_SITE_OTHER): Payer: 59 | Admitting: Physician Assistant

## 2013-12-28 ENCOUNTER — Ambulatory Visit (HOSPITAL_COMMUNITY)
Admission: RE | Admit: 2013-12-28 | Discharge: 2013-12-28 | Disposition: A | Discharge status: Home / Self Care | Payer: 59 | Source: Ambulatory Visit | Attending: Physician Assistant | Admitting: Physician Assistant

## 2013-12-28 ENCOUNTER — Encounter: Payer: Self-pay | Admitting: Physician Assistant

## 2013-12-28 VITALS — BP 148/60 | HR 84 | Ht 70.5 in | Wt 189.0 lb

## 2013-12-28 DIAGNOSIS — E7800 Pure hypercholesterolemia, unspecified: Secondary | ICD-10-CM

## 2013-12-28 DIAGNOSIS — K219 Gastro-esophageal reflux disease without esophagitis: Secondary | ICD-10-CM

## 2013-12-28 DIAGNOSIS — R0602 Shortness of breath: Secondary | ICD-10-CM

## 2013-12-28 DIAGNOSIS — R079 Chest pain, unspecified: Secondary | ICD-10-CM

## 2013-12-28 DIAGNOSIS — M1990 Unspecified osteoarthritis, unspecified site: Secondary | ICD-10-CM | POA: Insufficient documentation

## 2013-12-28 DIAGNOSIS — M47022 Vertebral artery compression syndromes, cervical region: Secondary | ICD-10-CM | POA: Insufficient documentation

## 2013-12-28 DIAGNOSIS — I2510 Atherosclerotic heart disease of native coronary artery without angina pectoris: Secondary | ICD-10-CM

## 2013-12-28 DIAGNOSIS — K449 Diaphragmatic hernia without obstruction or gangrene: Secondary | ICD-10-CM | POA: Diagnosis not present

## 2013-12-28 DIAGNOSIS — I5032 Chronic diastolic (congestive) heart failure: Secondary | ICD-10-CM

## 2013-12-28 DIAGNOSIS — I10 Essential (primary) hypertension: Secondary | ICD-10-CM

## 2013-12-28 NOTE — Patient Instructions (Signed)
Your physician recommends that you continue on your current medications as directed. Please refer to the Current Medication list given to you today.  Your physician recommends that you follow up with Dr. Radford Pax as scheduled in December.

## 2013-12-28 NOTE — Progress Notes (Signed)
Cardiology Office Note   Date:  12/28/2013   ID:  Larry Arroyo, DOB 03-10-1951, MRN [id number]  PCP:   Melinda Crutch, MD  Cardiologist:  Dr. Fransico Him    History of Present Illness: Larry Arroyo is a 63 y.o. male with a hx of CAD, HTN, HL, OSA.  He is s/p DES to the proximal LAD in 03/2013.  He had recurrent episodes of chest pain.  He ultimately underwent re-look cath 04/2013 that demonstrated a patent LAD stent.  He was recently seen on 09/28/13 with complaints of dizziness and shortness of breath. It was felt that this was related to his blood pressure. Adjustments were made to his medications.  However, he continued to have similar symptoms.  He also complained of chest pain.   Event monitor demonstrated NSR.  Brilinta was changed to Plavix.  BNP was normal.    He saw Dr. Fransico Him in FU 12/01/13.  He continued to have chest pain.  He was placed on PPI.  LHC was arranged.  This demonstrated patent stents in the LAD.  He had mod disease in the CFX and RCA.  CFX stenosis was not significant by FFR.  RCA could not be engaged to assess with FFR.  He had a FU myoview with small area of inferior ischemia.  This was reviewed by Dr. Radford Pax and Dr. Irish Lack (did the cath).  RCA lesion would be difficult to reach due to tortuosity.  Med Rx was recommended.  He was placed on Imdur and asked to FU today.  He did see GI.  UGI series this AM demonstrated a small to moderate sized hiatal hernia.  He does feel better with taking the Imdur.  He had to change it to later in the day b/c it was causing some lightheadedness.  He denies further chest pain.  He denies significant dyspnea.  He denies orthopnea, PND, edema.  He denies syncope.     Studies:  - Lexiscan Myoview (04/02/13):  No ischemia, EF 56%, low risk.   - Echo (04/07/13):  EF 55-60%, Gr 1 DD.  - LHC (04/13/13):  LAD 50-70, mid to distal RCA 30, EF 55%, LVEDP 20.  FFR of LAD:  0.76.  PCI:  Alpine 3.0 x 38 mm DES to prox LAD.    - LHC (05/12/13):   Ostial LAD 20-30%, prox LAD stent ok, mid RCA 30%, EF 55-60%.    - LHC (9/15):  prox to mid LAD stents ok, small D1 with prox 80%, ostial OM1 50-60%, mild to mod mid RCA, EF 55%, LVEDP 12 mmHg >> FFR of OM1 not significant (0.85); unable to access RCA for FFR.  - Lexiscan Myoview (9/15):  Small area of mild ischemia in apical inferior distribution, EF 53%, low risk  - Event Monitor (8/15):  NSR.   Recent Labs: 03/24/2013: Direct LDL 147.2  10/20/2013: Pro B Natriuretic peptide (BNP) 46.0  12/01/2013: ALT 39; HDL Cholesterol by NMR 52.00; LDL (calc) 174*  12/03/2013: Creatinine 1.12; Hemoglobin 13.6; Potassium 5.5*   Wt Readings from Last 3 Encounters:  12/23/13 189 lb 4 oz (85.843 kg)  12/07/13 181 lb (82.101 kg)  12/03/13 185 lb (83.915 kg)     Past Medical History  Diagnosis Date  . Complication of anesthesia     16 yrs ago following hip replacement states took a long time for full memory to return  . Arthritis   . Anxiety   . Depression   . Sleep disturbance   .  Coronary artery disease     nonobstructive ASCAD with 40-50% RCA, 60% D1, 70% inferior branche of D1  . Hypertension     ADDENDUM- STRESS TEST 9/12 with LOV DR TURNER ON CHART  . Hypercholesteremia   . Myocardial infarction   . Anginal pain   . Colon polyps 02/05/2010    Current Outpatient Prescriptions  Medication Sig Dispense Refill  . amLODipine (NORVASC) 2.5 MG tablet Take 1 tablet (2.5 mg total) by mouth daily.  30 tablet  11  . aspirin EC 81 MG tablet Take 1 tablet (81 mg total) by mouth daily.      Marland Kitchen atorvastatin (LIPITOR) 10 MG tablet Take 1 tablet (10 mg total) by mouth daily.  30 tablet  6  . carvedilol (COREG) 12.5 MG tablet Take 1 tablet (12.5 mg total) by mouth 2 (two) times daily with a meal.  60 tablet  11  . citalopram (CELEXA) 40 MG tablet Take 60 mg by mouth daily.       . clopidogrel (PLAVIX) 75 MG tablet Take 1 tablet (75 mg total) by mouth daily.  30 tablet  11  . isosorbide mononitrate  (IMDUR) 30 MG 24 hr tablet Take 1 tablet (30 mg total) by mouth daily.  90 tablet  3  . nitroGLYCERIN (NITROSTAT) 0.4 MG SL tablet Place 1 tablet (0.4 mg total) under the tongue every 5 (five) minutes as needed for chest pain.  25 tablet  3  . pantoprazole (PROTONIX) 40 MG tablet Take 1 tablet (40 mg total) by mouth daily.  30 tablet  6  . Psyllium (METAMUCIL) 28.3 % POWD Take 1 scoop by mouth 2 (two) times daily before a meal. To 8 oz. of juice or water      . ramipril (ALTACE) 5 MG capsule Take 1 capsule (5 mg total) by mouth 2 (two) times daily.  180 capsule  3  . tetrahydrozoline-zinc (VISINE-AC) 0.05-0.25 % ophthalmic solution Place 2 drops into both eyes 3 (three) times daily as needed (redness).        No current facility-administered medications for this visit.    Allergies:   Review of patient's allergies indicates no known allergies.   Social History:  The patient  reports that he has never smoked. He has never used smokeless tobacco. He reports that he drinks about 7.2 ounces of alcohol per week. He reports that he does not use illicit drugs.   Family History:  The patient's family history includes Aneurysm in his brother; Epilepsy (age of onset: 49) in his brother; Stroke in his brother. There is no history of Heart attack.   ROS:  Please see the history of present illness.  He has a lot of neck pain.    All other systems reviewed and negative.   PHYSICAL EXAM: VS:  BP 148/60  Pulse 84  Ht 5' 10.5" (1.791 m)  Wt 189 lb (85.73 kg)  BMI 26.73 kg/m2 Well nourished, well developed, in no acute distress HEENT: normal Neck: no JVD Cardiac:  normal S1, S2; RRR; no murmur Lungs:  clear to auscultation bilaterally, no wheezing, rhonchi or rales Abd: soft, nontender, no hepatomegaly Ext: no edemaright wrist without hematoma or mass  Skin: warm and dry Neuro:  CNs 2-12 intact, no focal abnormalities noted   EKG:  NSR, HR 84, normal axis, no ST changes, no change from prior tracing     ASSESSMENT AND PLAN:  1.  Chest Pain:  Much improved on Imdur.  He had some  mod residual disease on his LHC.  CFX stenosis was not significant by FFR.  He had mild amount of ischemia in the inferior wall on Nuclear study.  This would be difficult to approach with PCI.  Medical Rx has been recommended.  I reviewed this all with the patient today.  Continue current Rx.    2.  Dyspnea:  LVEDP was high at original cath.  Lasix has not changed symptoms.  Holding Lasix has not helped.  He has had some improvement with Plavix.  Symptoms are better with nitrates.    3.  Hypertension:  BP elevated today. He has a lot of symptoms with his BP medications.  Will monitor for now.  Consider increasing Amlodipine to 5 QD if BP remains > 140/90.  4.  CAD:  Symptoms improved on nitrates.  Continue ASA, Plavix, nitrates, statin.  5.  Diastolic CHF:   Volume stable.   6.  Hyperlipidemia:  Continue  statin.  7.  Anxiety:  FU with PCP.  8.  GERD:  Mild HH by UGI today.  Continue PPI and FU with GI as indicated.   Disposition:  FU Dr. Fransico Him 02/2014.   Signed, Richardson Dopp, PA-C  12/28/2013 2:22 PM

## 2014-02-24 ENCOUNTER — Encounter (HOSPITAL_COMMUNITY): Payer: Self-pay | Admitting: Cardiology

## 2014-03-15 ENCOUNTER — Ambulatory Visit: Payer: 59 | Admitting: Cardiology

## 2014-03-23 ENCOUNTER — Encounter: Payer: Self-pay | Admitting: Cardiology

## 2014-03-23 ENCOUNTER — Ambulatory Visit (INDEPENDENT_AMBULATORY_CARE_PROVIDER_SITE_OTHER): Payer: 59 | Admitting: Cardiology

## 2014-03-23 VITALS — BP 130/70 | HR 56 | Ht 70.0 in | Wt 190.0 lb

## 2014-03-23 DIAGNOSIS — I5032 Chronic diastolic (congestive) heart failure: Secondary | ICD-10-CM

## 2014-03-23 DIAGNOSIS — E7800 Pure hypercholesterolemia, unspecified: Secondary | ICD-10-CM

## 2014-03-23 DIAGNOSIS — I2510 Atherosclerotic heart disease of native coronary artery without angina pectoris: Secondary | ICD-10-CM

## 2014-03-23 DIAGNOSIS — I10 Essential (primary) hypertension: Secondary | ICD-10-CM

## 2014-03-23 DIAGNOSIS — I2583 Coronary atherosclerosis due to lipid rich plaque: Principal | ICD-10-CM

## 2014-03-23 MED ORDER — AMLODIPINE BESYLATE 5 MG PO TABS: 5.0000 mg | ORAL_TABLET | Freq: Every day | ORAL | Status: DC

## 2014-03-23 NOTE — Progress Notes (Signed)
[address] [place], [place]  [address] Phone: [phone] Fax:  [phone]  Date:  03/23/2014   ID:  BAKER MORONTA, DOB [birth year], MRN [id number]  PCP:   Melinda Crutch, MD  Cardiologist:  Fransico Him, MD    History of Present Illness: Larry Arroyo is a 64 y.o. male with a hx of CAD, HTN, HL, OSA. He is s/p DES to the proximal LAD in 03/2013. He had recurrent episodes of chest pain. He ultimately underwent re-look cath 04/2013 that demonstrated a patent LAD stent. He recently returned for f/u with PA Richardson Dopp. At that time he continued to have a myriad of symptoms as stated in note from 8/5. Mainly complaining of dizziness, blurred vision and dyspnea. Difficult to know what predates his PCI and what started afterward. He was complaining of significant DOE but no orthopnea, PND, edema. He had no significant change in symptoms with holding Lasix.   He wore a heart monitor for dizziness which showed NSR with no arrhythmias. He presents back today for followup. He continued to complains of chest pain which he stated is getting worse.Awakening him at night.He was instructed to start Prilosec at last OV but never started it.He was convinced that his chest pain was still related to his heart so he underwent repeat cath showing widely patent LAD stent, moderate disease of the mid left circ not significant by FFR with adenosine and mild to moderate RCA with normal LVF.  Nuclear stress test showed a very small area of ischemia in the inferior wall but on cath the RCA lesion did not appear>50% and was not amenable to flow wire for further assessment. Per Dr. Irish Lack, the lesion would be difficult due to tortuosity of the vessel. He was started on Imdur 70m daily.  I also referred for GI evaluation for possible esophageal spasm since some of his episodes occur at night and resolve with NTG. UGI showed a small to moderate sized hiatal hernia.  He was seen by SRichardson Dopp PA and was  doing better on Imdur.  He now presents back for followup.  His CP is much improved and has only had to take SL NTG 2 times in the am.  He is having problems with memory and is wondering if it could be medication related.  He still had some DOE.  He says that he is constantly dizzy when he goes from sitting to standing.  He denies any palpitations or syncope.    Wt Readings from Last 3 Encounters:  03/23/14 190 lb (86.183 kg)  12/28/13 189 lb (85.73 kg)  12/23/13 189 lb 4 oz (85.843 kg)     Past Medical History  Diagnosis Date  . Complication of anesthesia     16 yrs ago following hip replacement states took a long time for full memory to return  . Arthritis   . Anxiety   . Depression   . Sleep disturbance   . Coronary artery disease     nonobstructive ASCAD with 40-50% RCA, 60% D1, 70% inferior branche of D1  . Hypertension     ADDENDUM- STRESS TEST 9/12 with LOV DR Alonie Gazzola ON CHART  . Hypercholesteremia   . Myocardial infarction   . Anginal pain   . Colon polyps 02/05/2010    Current Outpatient Prescriptions  Medication Sig Dispense Refill  . amLODipine (NORVASC) 2.5 MG tablet Take 1 tablet (2.5 mg total) by mouth daily. 30 tablet 11  . aspirin EC 81  MG tablet Take 1 tablet (81 mg total) by mouth daily.    Marland Kitchen atorvastatin (LIPITOR) 10 MG tablet Take 1 tablet (10 mg total) by mouth daily. 30 tablet 6  . carvedilol (COREG) 12.5 MG tablet Take 1 tablet (12.5 mg total) by mouth 2 (two) times daily with a meal. 60 tablet 11  . citalopram (CELEXA) 40 MG tablet Take 60 mg by mouth daily. Take 1 1/2 tablet daily    . clopidogrel (PLAVIX) 75 MG tablet Take 1 tablet (75 mg total) by mouth daily. 30 tablet 11  . isosorbide mononitrate (IMDUR) 30 MG 24 hr tablet Take 1 tablet (30 mg total) by mouth daily. 90 tablet 3  . pantoprazole (PROTONIX) 40 MG tablet Take 1 tablet (40 mg total) by mouth daily. 30 tablet 6  . Psyllium (METAMUCIL) 28.3 % POWD Take 1 scoop by mouth 2 (two) times daily  before a meal. To 8 oz. of juice or water    . ramipril (ALTACE) 5 MG capsule Take 1 capsule (5 mg total) by mouth 2 (two) times daily. 180 capsule 3  . tetrahydrozoline-zinc (VISINE-AC) 0.05-0.25 % ophthalmic solution Place 2 drops into both eyes 3 (three) times daily as needed (redness).     . nitroGLYCERIN (NITROSTAT) 0.4 MG SL tablet Place 1 tablet (0.4 mg total) under the tongue every 5 (five) minutes as needed for chest pain. (Patient not taking: Reported on 03/23/2014) 25 tablet 3   No current facility-administered medications for this visit.    Allergies:   No Known Allergies  Social History:  The patient  reports that he has never smoked. He has never used smokeless tobacco. He reports that he drinks about 7.2 oz of alcohol per week. He reports that he does not use illicit drugs.   Family History:  The patient's family history includes Aneurysm in his brother; Epilepsy (age of onset: 85) in his brother; Stroke in his brother. There is no history of Heart attack.   ROS:  Please see the history of present illness.      All other systems reviewed and negative.   PHYSICAL EXAM: VS:  BP 130/70 mmHg  Pulse 56  Ht '5\' 10"'  (1.778 m)  Wt 190 lb (86.183 kg)  BMI 27.26 kg/m2 Well nourished, well developed, in no acute distress HEENT: normal Neck: no JVD Cardiac:  normal S1, S2; RRR; no murmur Lungs:  clear to auscultation bilaterally, no wheezing, rhonchi or rales Abd: soft, nontender, no hepatomegaly Ext: no edema Skin: warm and dry Neuro:  CNs 2-12 intact, no focal abnormalities noted  ASSESSMENT AND PLAN:  1. Chest Pain: Much improved on Imdur but still having some DOE and very infrequent CP. He had some mod residual disease on his LHC. CFX stenosis was not significant by FFR. He had mild amount of ischemia in the inferior wall on Nuclear study. This would be difficult to approach with PCI. Medical Rx has been recommended. Continue current Rx. He has some dizziness on the  Imdur and now has to take it at night.  He would like to try to consolidate his meds some.  I have recommended that he stop the Altace and increase Amlodipine to 77m daily. This will also help as an antianginal med.   Continue ASA/nitrates/Plavix/Coreg and statin.    2. Dyspnea: LVEDP was high at original cath. Lasix has not changed symptoms. Holding Lasix has not helped. He has had some improvement with Plavix. Symptoms are better with nitrates.   3. Hypertension:  BP controlled today.  Continue Amlodipine   4. CAD: Symptoms improved on nitrates. Continue ASA, Plavix, nitrates, statin.  5. Diastolic CHF: Volume stable.  6. Hyperlipidemia: Continuestatin for now until PCP completes workup on memory issues.  May need to hold statin if no other etiology can be found.  His LDL is not at goal as of Sept.  I will repeat an FLP and ALT  7. Anxiety: FU with PCP.  8. GERD: Mild HH by UGI today. Continue PPI and FU with GI as indicated.      9.  Memory issues - I have encouraged him to see his PCP  Followup with me in 6 months    Signed, Fransico Him, MD [facility] 03/23/2014 2:10 PM

## 2014-03-23 NOTE — Patient Instructions (Addendum)
Your physician has recommended you make the following change in your medication:  1) STOP ALTACE 2) INCREASE Amlodipine to 5 mg daily   Your physician recommends that you return for FASTING lab work on Monday, 1/11. You may come anytime between 7:30 AM and 5:15 PM.   Your physician wants you to follow-up in: 6 months with Dr. Radford Pax. You will receive a reminder letter in the mail two months in advance. If you don't receive a letter, please call our office to schedule the follow-up appointment.

## 2014-03-28 ENCOUNTER — Other Ambulatory Visit: Payer: 59

## 2014-03-29 ENCOUNTER — Other Ambulatory Visit (INDEPENDENT_AMBULATORY_CARE_PROVIDER_SITE_OTHER): Payer: 59 | Admitting: *Deleted

## 2014-03-29 DIAGNOSIS — I2583 Coronary atherosclerosis due to lipid rich plaque: Principal | ICD-10-CM

## 2014-03-29 DIAGNOSIS — I2510 Atherosclerotic heart disease of native coronary artery without angina pectoris: Secondary | ICD-10-CM

## 2014-03-29 LAB — LIPID PANEL
Cholesterol: 184 mg/dL (ref 0–200)
HDL: 55 mg/dL (ref >39.00)
LDL Cholesterol: 113 mg/dL — ABNORMAL HIGH (ref 0–99)
NonHDL: 129
TRIGLYCERIDES: 80 mg/dL (ref 0.0–149.0)
Total CHOL/HDL Ratio: 3
VLDL: 16 mg/dL (ref 0.0–40.0)

## 2014-03-29 LAB — ALT: ALT: 26 U/L (ref 0–53)

## 2014-03-30 ENCOUNTER — Telehealth: Payer: Self-pay

## 2014-03-30 DIAGNOSIS — E7800 Pure hypercholesterolemia, unspecified: Secondary | ICD-10-CM

## 2014-03-30 MED ORDER — ATORVASTATIN CALCIUM 20 MG PO TABS
20.0000 mg | ORAL_TABLET | Freq: Every day | ORAL | Status: DC
Start: 2014-03-30 — End: 2014-05-25

## 2014-03-30 NOTE — Telephone Encounter (Signed)
Patient informed of results and verbal understanding expressed.  Instructed patient to INCREASE Lipitor to 20 mg daily. FLP and ALT scheduled for 3/1. Patient agrees with treatment plan.

## 2014-03-30 NOTE — Telephone Encounter (Signed)
-----   Message from Sueanne Margarita, MD sent at 03/29/2014  3:26 PM EST ----- LDL not at goal - increase Lipitor to 20mg  daily and recheck FLP and ALT in 6 weeks

## 2014-05-03 ENCOUNTER — Other Ambulatory Visit: Payer: Self-pay | Admitting: Cardiology

## 2014-05-17 ENCOUNTER — Other Ambulatory Visit (INDEPENDENT_AMBULATORY_CARE_PROVIDER_SITE_OTHER): Payer: 59 | Admitting: *Deleted

## 2014-05-17 DIAGNOSIS — E7800 Pure hypercholesterolemia, unspecified: Secondary | ICD-10-CM

## 2014-05-17 LAB — LIPID PANEL
Cholesterol: 226 mg/dL — ABNORMAL HIGH (ref 0–200)
HDL: 59.9 mg/dL (ref >39.00)
LDL CALC: 151 mg/dL — AB (ref 0–99)
NonHDL: 166.1
TRIGLYCERIDES: 78 mg/dL (ref 0.0–149.0)
Total CHOL/HDL Ratio: 4
VLDL: 15.6 mg/dL (ref 0.0–40.0)

## 2014-05-17 LAB — ALT: ALT: 28 U/L (ref 0–53)

## 2014-05-25 ENCOUNTER — Telehealth: Payer: Self-pay | Admitting: *Deleted

## 2014-05-25 MED ORDER — EZETIMIBE 10 MG PO TABS: 10.0000 mg | ORAL_TABLET | Freq: Every day | ORAL | Status: DC

## 2014-05-25 NOTE — Telephone Encounter (Signed)
Called and informed pt of new recommendation to start taking Zetia 10mg  daily. Pt in agreement to try this medication. Verified pharmacy and informed pt that I would send over prescription. Pt verbalized understanding and was in agreement with this plan.

## 2014-05-25 NOTE — Telephone Encounter (Signed)
-----   Message from Sueanne Margarita, MD sent at 05/24/2014 11:35 PM EST ----- Please find out if patient willing to take zetia

## 2014-07-05 ENCOUNTER — Other Ambulatory Visit: Payer: Self-pay | Admitting: Cardiology

## 2014-07-06 ENCOUNTER — Encounter: Payer: Self-pay | Admitting: Cardiology

## 2014-07-28 ENCOUNTER — Other Ambulatory Visit: Payer: Self-pay | Admitting: Family Medicine

## 2014-07-28 ENCOUNTER — Ambulatory Visit
Admission: RE | Admit: 2014-07-28 | Discharge: 2014-07-28 | Disposition: A | Discharge status: Home / Self Care | Payer: 59 | Source: Ambulatory Visit | Attending: Family Medicine | Admitting: Family Medicine

## 2014-07-28 DIAGNOSIS — M545 Low back pain: Secondary | ICD-10-CM

## 2014-08-18 ENCOUNTER — Other Ambulatory Visit: Payer: Self-pay | Admitting: Physician Assistant

## 2014-08-19 NOTE — Telephone Encounter (Signed)
Per note 1.6.16

## 2014-09-25 ENCOUNTER — Other Ambulatory Visit: Payer: Self-pay | Admitting: Physician Assistant

## 2014-09-25 ENCOUNTER — Other Ambulatory Visit: Payer: Self-pay | Admitting: Cardiology

## 2014-09-26 ENCOUNTER — Encounter: Payer: Self-pay | Admitting: Cardiology

## 2014-09-26 ENCOUNTER — Ambulatory Visit (INDEPENDENT_AMBULATORY_CARE_PROVIDER_SITE_OTHER): Payer: 59 | Admitting: Cardiology

## 2014-09-26 VITALS — BP 120/88 | HR 89 | Ht 70.0 in | Wt 196.6 lb

## 2014-09-26 DIAGNOSIS — I5032 Chronic diastolic (congestive) heart failure: Secondary | ICD-10-CM

## 2014-09-26 DIAGNOSIS — I10 Essential (primary) hypertension: Secondary | ICD-10-CM | POA: Diagnosis not present

## 2014-09-26 DIAGNOSIS — I2510 Atherosclerotic heart disease of native coronary artery without angina pectoris: Secondary | ICD-10-CM | POA: Diagnosis not present

## 2014-09-26 DIAGNOSIS — I2583 Coronary atherosclerosis due to lipid rich plaque: Principal | ICD-10-CM

## 2014-09-26 DIAGNOSIS — E7800 Pure hypercholesterolemia, unspecified: Secondary | ICD-10-CM

## 2014-09-26 NOTE — Patient Instructions (Signed)
Medication Instructions:  No changes today  Labwork: None today  Testing/Procedures: None today  Follow-Up: Your physician wants you to follow-up in: 1 year with Dr Radford Pax. (July 2017). You will receive a reminder letter in the mail two months in advance. If you don't receive a letter, please call our office to schedule the follow-up appointment.   Any Other Special Instructions Will Be Listed Below (If Applicable).  You have been referred to the [facility] for evaluation for new medication for cholesterol-PCSK9 inhibitor. You should expect a call from our office to schedule an appointment for you with the pharmacist. Call our office the end of the week if you have not heard anything. [phone]   Thank you for choosing [facility]!!

## 2014-09-26 NOTE — Progress Notes (Signed)
Cardiology Office Note   Date:  09/26/2014   ID:  CLESTON Arroyo, DOB 1950-04-20, MRN [id number]  PCP:   Melinda Crutch, MD    Chief Complaint  Patient presents with  . Follow-up    CAD      History of Present Illness: Larry Arroyo is a 64 y.o. male with a hx of CAD, HTN, HL, OSA. He is s/p DES to the proximal LAD in 03/2013 and repeat cath for ongoing CP showing widely patent LAD stent, moderate disease of the mid left circ not significant by FFR with adenosine and mild to moderate RCA with normal LVF. Nuclear stress test showed a very small area of ischemia in the inferior wall but on cath the RCA lesion did not appear >50% and was not amenable to flow wire for further assessment. He also has a small to moderate sized hiatal hernia.  He now presents back for followup. His CP is much improved.  He only gets chest pressure if he works out hard in the heat.   He denies any palpitations or syncope. He says that all of the medications tend to make him dizzy.  This occurs mainly with standing up or going up stairs.  He has chronic DOE that only occurs with extreme exertion.  He denies any LE edema, syncope.  His heart tends to race when he gets anxious.   Past Medical History  Diagnosis Date  . Complication of anesthesia     16 yrs ago following hip replacement states took a long time for full memory to return  . Arthritis   . Anxiety   . Depression   . Sleep disturbance   . Hypertension     ADDENDUM- STRESS TEST 9/12 with LOV DR Liliyana Thobe ON CHART  . Hypercholesteremia   . Myocardial infarction   . Anginal pain   . Colon polyps 02/05/2010  . Coronary artery disease     CAD with PCI of LAD with recent cath showing patent LAD stent with nonobstructive ASCAD with 40-50% RCA, 60% D1, 70% inferior branche of D1    Past Surgical History  Procedure Laterality Date  . Cardiac catheterization      nonobstructive ASCAD of the RCA of 40-50%, 60% D1, and 70% inferior  branch of D1  . Total hip arthroplasty  02/11/2011    Procedure: TOTAL HIP ARTHROPLASTY;  Surgeon: Dione Plover Aluisio;  Location: [facility];  Service: Orthopedics;  Laterality: Right;  . Coronary angioplasty with stent placement  01/272015    FFR and 3.0 x 38 mm Alpine DES to the LAD  . Total hip arthroplasty Left   . Left heart catheterization with coronary angiogram N/A 04/13/2013    Procedure: LEFT HEART CATHETERIZATION WITH CORONARY ANGIOGRAM;  Surgeon: Sueanne Margarita, MD;  Location: [facility] CATH LAB;  Service: Cardiovascular;  Laterality: N/A;  . Left heart catheterization with coronary angiogram N/A 05/12/2013    Procedure: LEFT HEART CATHETERIZATION WITH CORONARY ANGIOGRAM;  Surgeon: Burnell Blanks, MD;  Location: [facility] C[facility] LAB;  Service: Cardiovascular;  Laterality: N/A;  . Left heart catheterization with coronary angiogram N/A 12/03/2013    Procedure: LEFT HEART CATHETERIZATION WITH CORONARY ANGIOGRAM;  Surgeon: Jettie Booze, MD;  Location: [facility] CATH LAB;  Service: Cardiovascular;  Laterality: N/A;     Current Outpatient Prescriptions  Medication Sig Dispense Refill  . amLODipine (NORVASC) 5 MG tablet Take 1  tablet (5 mg total) by mouth daily. 30 tablet 6  . aspirin EC 81 MG tablet Take 1 tablet (81 mg total) by mouth daily.    . carvedilol (COREG) 12.5 MG tablet TAKE 1 TABLET (12.5 MG TOTAL) BY MOUTH 2 (TWO) TIMES DAILY WITH A MEAL. 60 tablet 5  . citalopram (CELEXA) 40 MG tablet Take 60 mg by mouth daily. Take 1 1/2 tablet daily    . clopidogrel (PLAVIX) 75 MG tablet Take 1 tablet (75 mg total) by mouth daily. 30 tablet 11  . cyclobenzaprine (FLEXERIL) 10 MG tablet Take 10 mg by mouth 3 (three) times daily as needed for muscle spasms.    Marland Kitchen ezetimibe (ZETIA) 10 MG tablet Take 1 tablet (10 mg total) by mouth daily. 30 tablet 6  . isosorbide mononitrate (IMDUR) 30 MG 24 hr tablet Take 1 tablet (30 mg total) by mouth daily. 90 tablet 3  . nitroGLYCERIN (NITROSTAT) 0.4 MG SL tablet Place  1 tablet (0.4 mg total) under the tongue every 5 (five) minutes as needed for chest pain. 25 tablet 3  . pantoprazole (PROTONIX) 40 MG tablet TAKE 1 TABLET (40 MG TOTAL) BY MOUTH DAILY. 30 tablet 2  . Psyllium (METAMUCIL) 28.3 % POWD Take 1 scoop by mouth 2 (two) times daily before a meal. To 8 oz. of juice or water    . tetrahydrozoline-zinc (VISINE-AC) 0.05-0.25 % ophthalmic solution Place 2 drops into both eyes 3 (three) times daily as needed (redness).      No current facility-administered medications for this visit.    Allergies:   Review of patient's allergies indicates no known allergies.    Social History:  The patient  reports that he has never smoked. He has never used smokeless tobacco. He reports that he drinks about 7.2 oz of alcohol per week. He reports that he does not use illicit drugs.   Family History:  The patient's family history includes Aneurysm in his brother; Epilepsy (age of onset: 51) in his brother; Stroke in his brother. There is no history of Heart attack.    ROS:  Please see the history of present illness.   Otherwise, review of systems are positive for none.   All other systems are reviewed and negative.    PHYSICAL EXAM: VS:  BP 120/88 mmHg  Pulse 89  Ht 5\' 10"  (1.778 m)  Wt 196 lb 9.6 oz (89.177 kg)  BMI 28.21 kg/m2  SpO2 98% , BMI Body mass index is 28.21 kg/(m^2). GEN: Well nourished, well developed, in no acute distress HEENT: normal Neck: no JVD, carotid bruits, or masses Cardiac: RRR; no murmurs, rubs, or gallops,no edema  Respiratory:  clear to auscultation bilaterally, normal work of breathing GI: soft, nontender, nondistended, + BS MS: no deformity or atrophy Skin: warm and dry, no rash Neuro:  Strength and sensation are intact Psych: euthymic mood, full affect   EKG:  EKG is not ordered today.    Recent Labs: 10/20/2013: Pro B Natriuretic peptide (BNP) 46.0 12/03/2013: BUN 22; Creatinine, Ser 1.12; Hemoglobin 13.6; Platelets 203;  Potassium 5.5*; Sodium 136* 05/17/2014: ALT 28    Lipid Panel    Component Value Date/Time   CHOL 226* 05/17/2014 0940   TRIG 78.0 05/17/2014 0940   HDL 59.90 05/17/2014 0940   CHOLHDL 4 05/17/2014 0940   VLDL 15.6 05/17/2014 0940   LDLCALC 151* 05/17/2014 0940   LDLDIRECT 147.2 03/24/2013 1007      Wt Readings from Last 3 Encounters:  09/26/14 196  lb 9.6 oz (89.177 kg)  03/23/14 190 lb (86.183 kg)  12/28/13 189 lb (85.73 kg)     ASSESSMENT AND PLAN:  1. Chest Pain: Much improved on Imdur with very infrequent CP and SOB that only occur with extreme exertion. He had some mod residual disease on his LHC. CFX stenosis was not significant by FFR. He had mild amount of ischemia in the inferior wall on Nuclear study. This would be difficult to approach with PCI.Continue ASA/nitrates/Plavix/Coreg and statin.   2. Dyspnea:Symptoms much improved with nitrates.   3. Hypertension: BP controlled today. Continue Amlodipine/coreg.  He is complaining that he gets dizzy walking up stairs or whenever he gets anxious.  I have encouraged him to check his BP during these dizzy spells and call with the results.  His SBP was 14mmHg prior to taking his meds this am so I do not want to cut back on BP meds unless we have documentation that his Bp drops causing dizziness  4. CAD: Symptoms improved on nitrates. Continue ASA, Plavix, nitrates.  Statin intolerant.  I will refer him to lipid clinic to see if he is a candidate for the PCSK9 inhibitor and if not proceed with randomizing to lipid study.   5. Diastolic CHF: Volume stable.  6. Hyperlipidemia:statin intolerant.  Now on Zetia.    7. Anxiety: FU with PCP.  8. GERD: Mild HH by UGI. Continue PPI and FU with GI as indicated.    Current medicines are reviewed at length with the patient today.  The patient does not have concerns regarding medicines.  The following changes have been made:  no change  Labs/ tests  ordered today: See above Assessment and Plan No orders of the defined types were placed in this encounter.     Disposition:   FU with me in 1 year  Signed, Sueanne Margarita, MD  09/26/2014 11:28 AM    [facility] [address] [place], [place], [place]  [address] Phone: [phone]; Fax: [phone]

## 2014-09-27 ENCOUNTER — Other Ambulatory Visit: Payer: Self-pay

## 2014-09-27 DIAGNOSIS — I2510 Atherosclerotic heart disease of native coronary artery without angina pectoris: Secondary | ICD-10-CM

## 2014-09-27 MED ORDER — CLOPIDOGREL BISULFATE 75 MG PO TABS: 75.0000 mg | ORAL_TABLET | Freq: Every day | ORAL | Status: DC

## 2014-10-21 ENCOUNTER — Other Ambulatory Visit: Payer: Self-pay | Admitting: Cardiology

## 2014-10-22 IMAGING — CR DG CHEST 2V
2 series · 2 of 2 positions shown · non-contrast
Comparison: 09/10/2012

CLINICAL DATA: Preop for catheterization, hypertension

EXAM:
CHEST  2 VIEW

[view not recorded (1 of 2)]
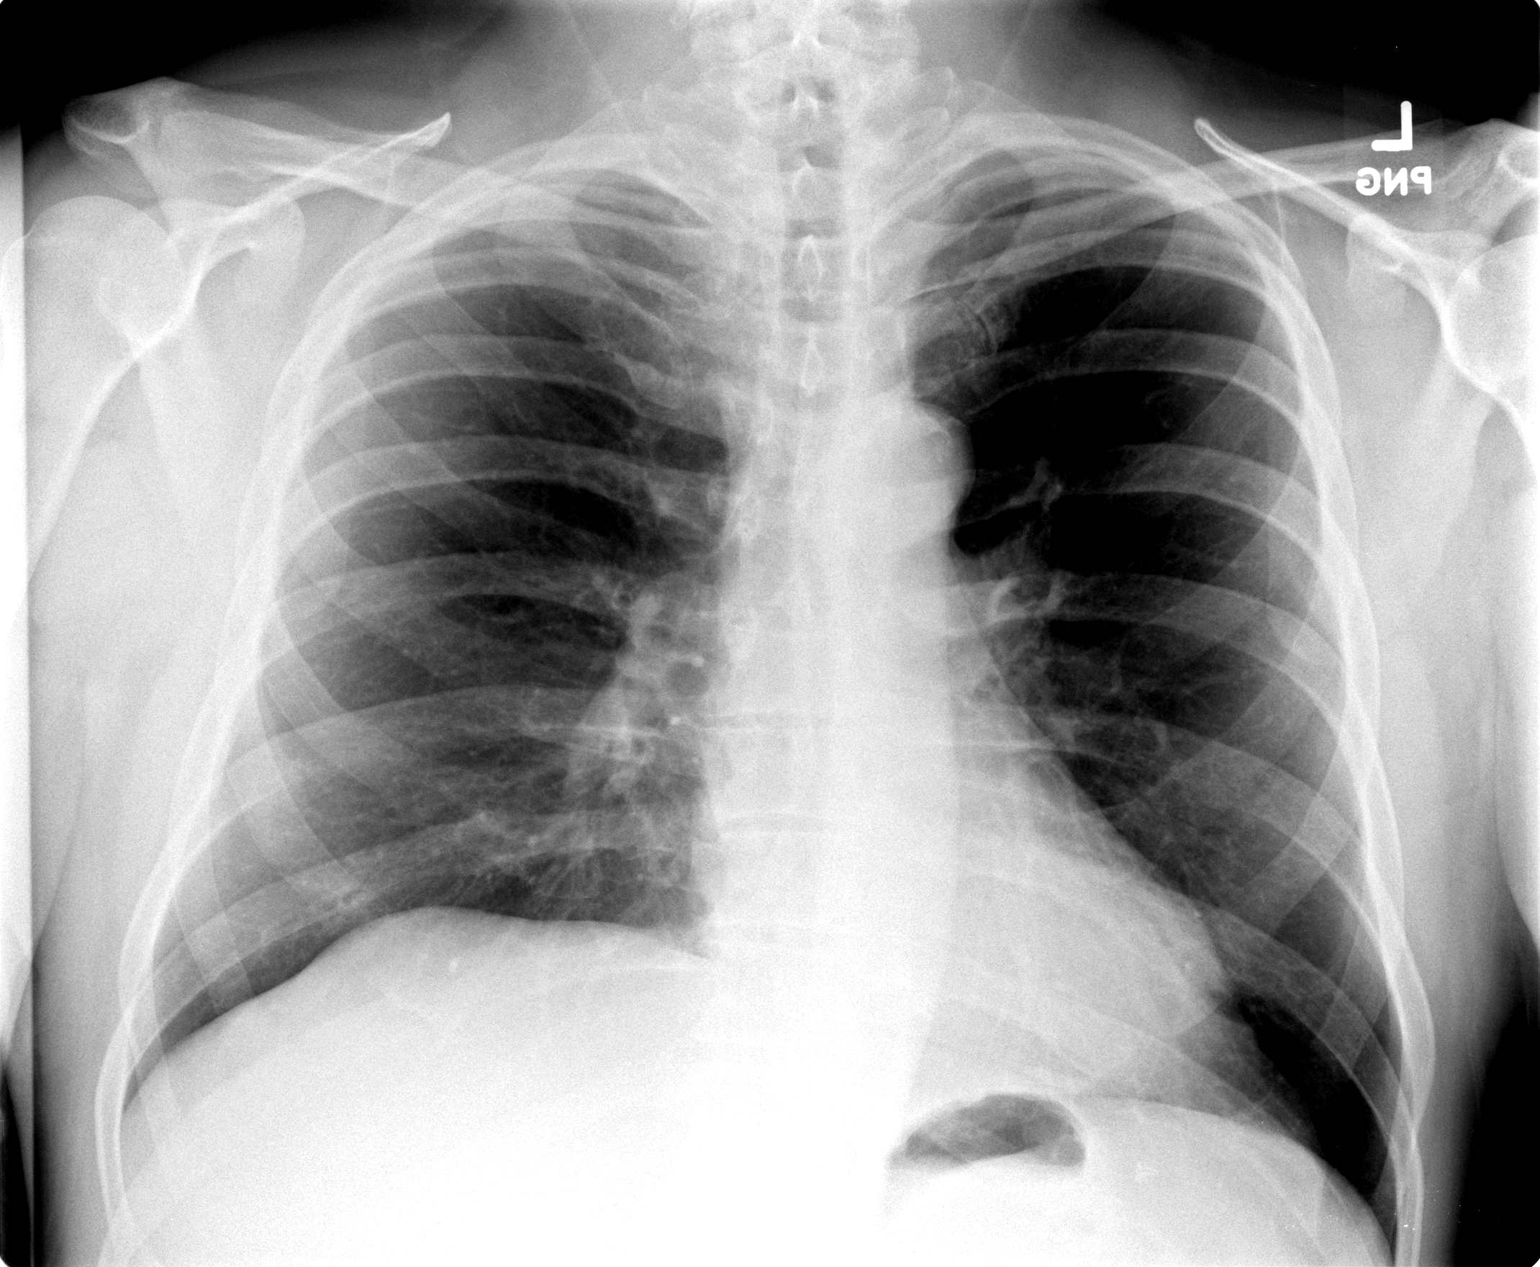

[view not recorded (2 of 2)]
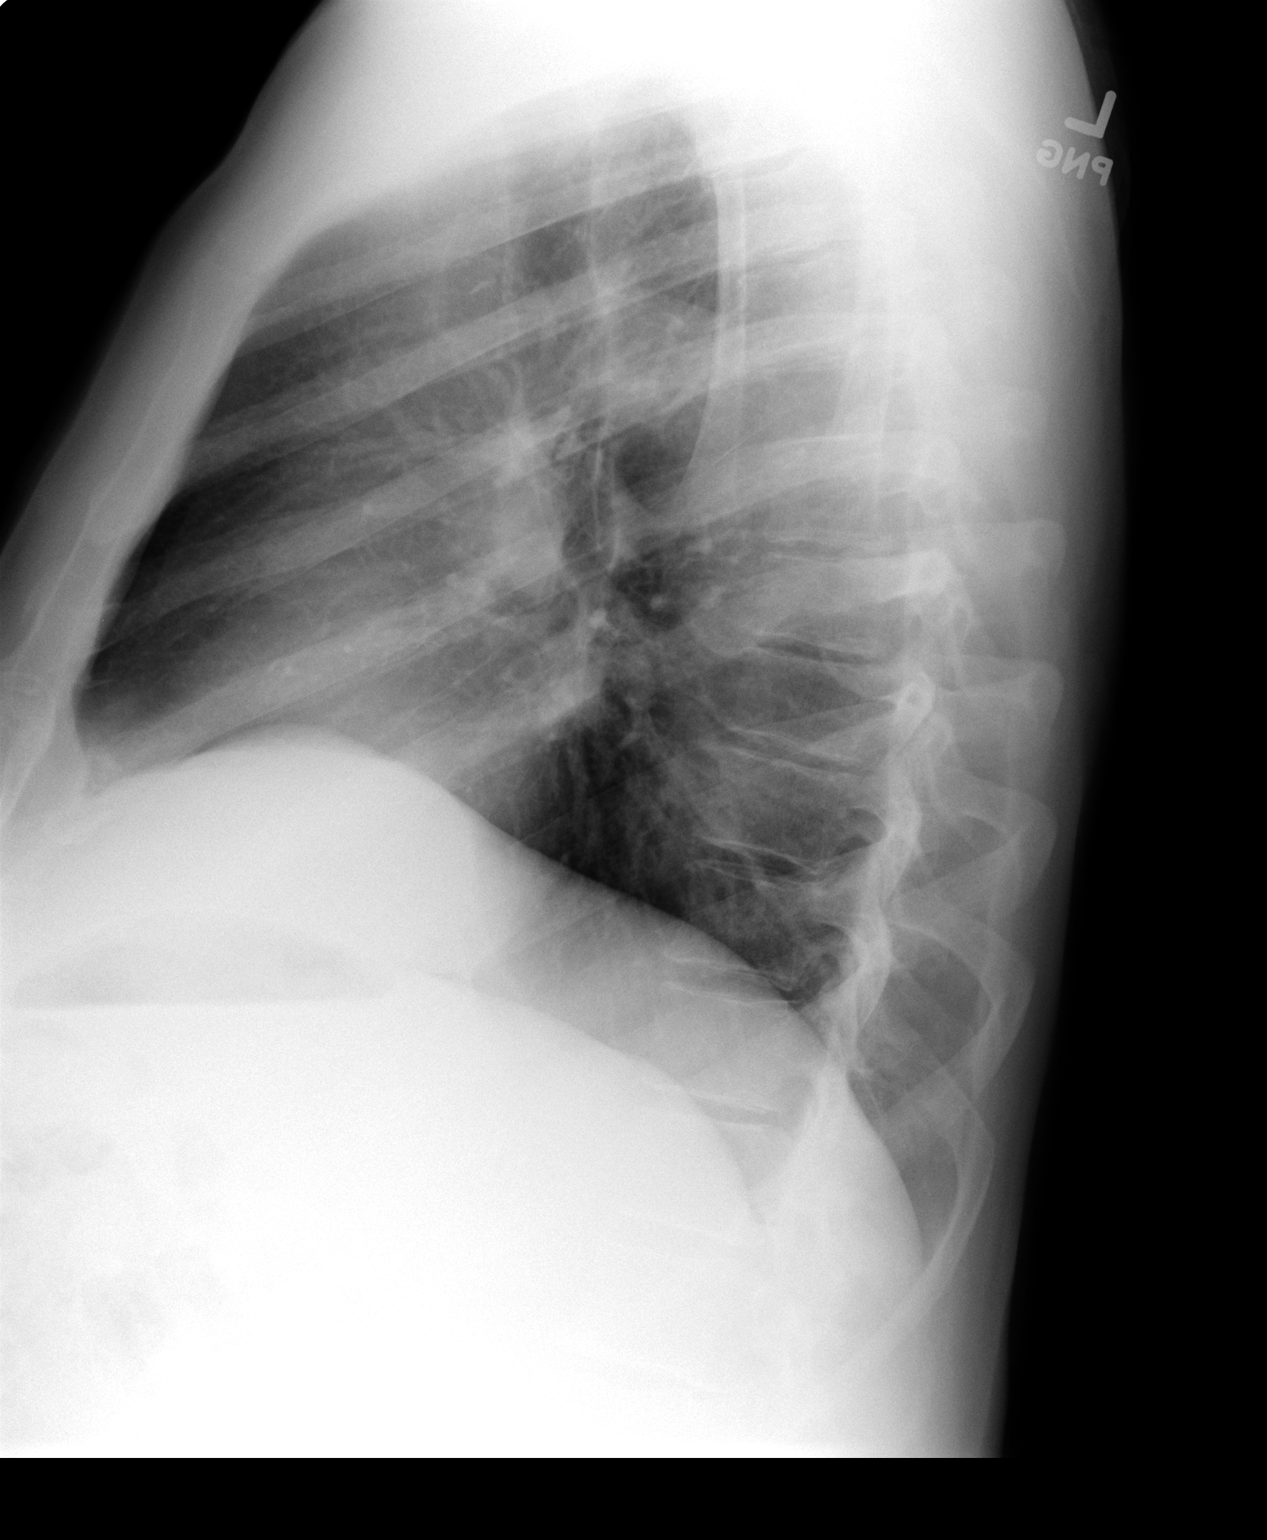

[2 of 2 positions shown; findings below may reference images not displayed]

FINDINGS: Cardiomediastinal silhouette is stable. No acute infiltrate or
pleural effusion. No pulmonary edema. Bony thorax is unremarkable.
IMPRESSION: No active cardiopulmonary disease.

## 2014-12-20 ENCOUNTER — Other Ambulatory Visit: Payer: Self-pay | Admitting: Cardiology

## 2015-01-17 ENCOUNTER — Other Ambulatory Visit: Payer: Self-pay | Admitting: Cardiology

## 2015-01-30 ENCOUNTER — Encounter: Payer: Self-pay | Admitting: Gastroenterology

## 2015-03-02 ENCOUNTER — Other Ambulatory Visit: Payer: Self-pay | Admitting: Cardiology

## 2015-03-06 ENCOUNTER — Other Ambulatory Visit: Payer: Self-pay | Admitting: Cardiology

## 2015-07-20 ENCOUNTER — Ambulatory Visit (INDEPENDENT_AMBULATORY_CARE_PROVIDER_SITE_OTHER): Payer: Medicare Other | Admitting: Cardiology

## 2015-07-20 ENCOUNTER — Encounter: Payer: Self-pay | Admitting: Cardiology

## 2015-07-20 VITALS — BP 130/100 | HR 82 | Ht 70.0 in | Wt 194.8 lb

## 2015-07-20 DIAGNOSIS — I10 Essential (primary) hypertension: Secondary | ICD-10-CM

## 2015-07-20 DIAGNOSIS — G4710 Hypersomnia, unspecified: Secondary | ICD-10-CM | POA: Diagnosis not present

## 2015-07-20 DIAGNOSIS — E7800 Pure hypercholesterolemia, unspecified: Secondary | ICD-10-CM | POA: Diagnosis not present

## 2015-07-20 DIAGNOSIS — R0683 Snoring: Secondary | ICD-10-CM

## 2015-07-20 DIAGNOSIS — R079 Chest pain, unspecified: Secondary | ICD-10-CM

## 2015-07-20 DIAGNOSIS — I739 Peripheral vascular disease, unspecified: Secondary | ICD-10-CM

## 2015-07-20 DIAGNOSIS — I2583 Coronary atherosclerosis due to lipid rich plaque: Principal | ICD-10-CM

## 2015-07-20 DIAGNOSIS — I2510 Atherosclerotic heart disease of native coronary artery without angina pectoris: Secondary | ICD-10-CM

## 2015-07-20 DIAGNOSIS — G4719 Other hypersomnia: Secondary | ICD-10-CM

## 2015-07-20 DIAGNOSIS — I5032 Chronic diastolic (congestive) heart failure: Secondary | ICD-10-CM | POA: Diagnosis not present

## 2015-07-20 HISTORY — DX: Peripheral vascular disease, unspecified: I73.9

## 2015-07-20 LAB — CBC WITH DIFFERENTIAL/PLATELET
BASOS PCT: 1 %
Basophils Absolute: 79 cells/uL (ref 0–200)
EOS PCT: 15 %
Eosinophils Absolute: 1185 cells/uL — ABNORMAL HIGH (ref 15–500)
HCT: 44.9 % (ref 38.5–50.0)
Hemoglobin: 15.2 g/dL (ref 13.2–17.1)
LYMPHS PCT: 12 %
Lymphs Abs: 948 cells/uL (ref 850–3900)
MCH: 29.3 pg (ref 27.0–33.0)
MCHC: 33.9 g/dL (ref 32.0–36.0)
MCV: 86.5 fL (ref 80.0–100.0)
MONO ABS: 869 {cells}/uL (ref 200–950)
MONOS PCT: 11 %
MPV: 10.6 fL (ref 7.5–12.5)
NEUTROS PCT: 61 %
Neutro Abs: 4819 cells/uL (ref 1500–7800)
PLATELETS: 217 10*3/uL (ref 140–400)
RBC: 5.19 MIL/uL (ref 4.20–5.80)
RDW: 14.9 % (ref 11.0–15.0)
WBC: 7.9 10*3/uL (ref 3.8–10.8)

## 2015-07-20 LAB — BASIC METABOLIC PANEL
BUN: 16 mg/dL (ref 7–25)
CO2: 22 mmol/L (ref 20–31)
Calcium: 9.2 mg/dL (ref 8.6–10.3)
Chloride: 103 mmol/L (ref 98–110)
Creat: 1.04 mg/dL (ref 0.70–1.25)
GLUCOSE: 109 mg/dL — AB (ref 65–99)
Potassium: 4.5 mmol/L (ref 3.5–5.3)
Sodium: 136 mmol/L (ref 135–146)

## 2015-07-20 LAB — HEPATIC FUNCTION PANEL
ALK PHOS: 76 U/L (ref 40–115)
ALT: 24 U/L (ref 9–46)
AST: 21 U/L (ref 10–35)
Albumin: 4.2 g/dL (ref 3.6–5.1)
BILIRUBIN DIRECT: 0.1 mg/dL (ref <=0.2)
BILIRUBIN TOTAL: 0.5 mg/dL (ref 0.2–1.2)
Indirect Bilirubin: 0.4 mg/dL (ref 0.2–1.2)
TOTAL PROTEIN: 7.3 g/dL (ref 6.1–8.1)

## 2015-07-20 LAB — LIPID PANEL
CHOL/HDL RATIO: 4.2 ratio (ref <=5.0)
Cholesterol: 233 mg/dL — ABNORMAL HIGH (ref 125–200)
HDL: 56 mg/dL (ref >=40)
LDL CALC: 159 mg/dL — AB (ref <130)
TRIGLYCERIDES: 90 mg/dL (ref <150)
VLDL: 18 mg/dL (ref <30)

## 2015-07-20 LAB — CK TOTAL AND CKMB (NOT AT ARMC)
CK, MB: <0.7 ng/mL (ref 0.0–5.0)
Total CK: 70 U/L (ref 7–232)

## 2015-07-20 LAB — TSH: TSH: 1.29 mIU/L (ref 0.40–4.50)

## 2015-07-20 MED ORDER — NITROGLYCERIN 0.4 MG SL SUBL: 0.4000 mg | SUBLINGUAL_TABLET | SUBLINGUAL | Status: DC | PRN

## 2015-07-20 NOTE — Patient Instructions (Signed)
Medication Instructions:  Your physician recommends that you continue on your current medications as directed. Please refer to the Current Medication list given to you today.   Labwork: TODAY: BMET, CBC, TSH, LFTs, Lipids, CPK  Testing/Procedures: Your physician has requested that you have a lower extremity arterial duplex. During this test, ultrasound is used to evaluate arterial blood flow in the legs. Allow one hour for this exam. There are no restrictions or special instructions.  Your physician has requested that you have a lexiscan myoview. For further information please visit [email or web address]. Please follow instruction sheet, as given.  Your physician has recommended that you have a sleep study. This test records several body functions during sleep, including: brain activity, eye movement, oxygen and carbon dioxide blood levels, heart rate and rhythm, breathing rate and rhythm, the flow of air through your mouth and nose, snoring, body muscle movements, and chest and belly movement.   Follow-Up: Your physician wants you to follow-up in: 6 months with a PA or NP. You will receive a reminder letter in the mail two months in advance. If you don't receive a letter, please call our office to schedule the follow-up appointment.   Your physician wants you to follow-up in: 1 year with Dr. Radford Pax. You will receive a reminder letter in the mail two months in advance. If you don't receive a letter, please call our office to schedule the follow-up appointment.  Any Other Special Instructions Will Be Listed Below (If Applicable).     If you need a refill on your cardiac medications before your next appointment, please call your pharmacy.

## 2015-07-20 NOTE — Progress Notes (Signed)
Cardiology Office Note    Date:  07/20/2015   ID:  Larry TIMMERMANN, DOB 1950-09-22, MRN [id number]  PCP:   Melinda Crutch, MD  Cardiologist:  Sueanne Margarita, MD   Chief Complaint  Patient presents with  . Coronary Artery Disease  . Hypertension  . Hyperlipidemia    History of Present Illness:  Larry Arroyo is a 65 y.o. male with a hx of CAD, HTN, HL, OSA. He is s/p DES to the proximal LAD in 03/2013 and repeat cath for ongoing CP showing widely patent LAD stent, moderate disease of the mid left circ not significant by FFR with adenosine and mild to moderate RCA with normal LVF. Nuclear stress test showed a very small area of ischemia in the inferior wall but on cath the RCA lesion did not appear >50% and was not amenable to flow wire for further assessment. He also has a small to moderate sized hiatal hernia. He now presents back for followup. He says that his wife says that he is snoring at night and he is waking up frequently at night and then feels very sleepy during the day. He also says that after he takes his coreg he feels very tired. He is also complaining of sore and achy muscles in his hips, thighs and calf muscles with exercise that resolves with rest.  He has had about 3 episodes of pressure in his chest since I saw him that lasted about an hour and was nonexertional.  He has chronic DOE which is about the same and has not gotten better.   It mainly occurs with exertion.  He denies dizziness, palpitations or syncope.       Past Medical History  Diagnosis Date  . Complication of anesthesia     16 yrs ago following hip replacement states took a long time for full memory to return  . Arthritis   . Anxiety   . Depression   . Sleep disturbance   . Hypertension     ADDENDUM- STRESS TEST 9/12 with LOV DR TURNER ON CHART  . Hypercholesteremia   . Myocardial infarction ([place])   . Anginal pain ([place])   . Colon polyps 02/05/2010  . Coronary artery disease     CAD with PCI of  LAD with recent cath showing patent LAD stent with nonobstructive ASCAD with 40-50% RCA, 60% D1, 70% inferior branche of D1  . Claudication ([place]) 07/20/2015    Past Surgical History  Procedure Laterality Date  . Cardiac catheterization      nonobstructive ASCAD of the RCA of 40-50%, 60% D1, and 70% inferior branch of D1  . Total hip arthroplasty  02/11/2011    Procedure: TOTAL HIP ARTHROPLASTY;  Surgeon: Dione Plover Aluisio;  Location: [facility];  Service: Orthopedics;  Laterality: Right;  . Coronary angioplasty with stent placement  01/272015    FFR and 3.0 x 38 mm Alpine DES to the LAD  . Total hip arthroplasty Left   . Left heart catheterization with coronary angiogram N/A 04/13/2013    Procedure: LEFT HEART CATHETERIZATION WITH CORONARY ANGIOGRAM;  Surgeon: Sueanne Margarita, MD;  Location: [facility] CATH LAB;  Service: Cardiovascular;  Laterality: N/A;  . Left heart catheterization with coronary angiogram N/A 05/12/2013    Procedure: LEFT HEART CATHETERIZATION WITH CORONARY ANGIOGRAM;  Surgeon: Burnell Blanks, MD;  Location[facility] CATH LAB;  Service: Cardiovascular;  Laterality: N/A;  . Left heart catheterization with coronary angiogram N/A 12/03/2013    Procedure: LEFT HEART  CATHETERIZATION WITH CORONARY ANGIOGRAM;  Surgeon: Jettie Booze, MD;  Location: [facility] CATH LAB;  Service: Cardiovascular;  Laterality: N/A;    Current Medications: Outpatient Prescriptions Prior to Visit  Medication Sig Dispense Refill  . amLODipine (NORVASC) 5 MG tablet TAKE 1 TABLET (5 MG TOTAL) BY MOUTH DAILY. 30 tablet 6  . aspirin EC 81 MG tablet Take 1 tablet (81 mg total) by mouth daily.    . carvedilol (COREG) 12.5 MG tablet TAKE 1 TABLET (12.5 MG TOTAL) BY MOUTH 2 (TWO) TIMES DAILY WITH A MEAL. 60 tablet 11  . citalopram (CELEXA) 40 MG tablet Take 40 mg by mouth daily.     . clopidogrel (PLAVIX) 75 MG tablet Take 1 tablet (75 mg total) by mouth daily. 30 tablet 11  . cyclobenzaprine (FLEXERIL) 10 MG tablet Take 10  mg by mouth 3 (three) times daily as needed for muscle spasms.    . isosorbide mononitrate (IMDUR) 30 MG 24 hr tablet TAKE 1 TABLET (30 MG TOTAL) BY MOUTH DAILY. 90 tablet 3  . nitroGLYCERIN (NITROSTAT) 0.4 MG SL tablet Place 1 tablet (0.4 mg total) under the tongue every 5 (five) minutes as needed for chest pain. 25 tablet 3  . pantoprazole (PROTONIX) 40 MG tablet TAKE 1 TABLET (40 MG TOTAL) BY MOUTH DAILY. 30 tablet 7  . Psyllium (METAMUCIL) 28.3 % POWD Take 1 scoop by mouth 2 (two) times daily before a meal. To 8 oz. of juice or water    . tetrahydrozoline-zinc (VISINE-AC) 0.05-0.25 % ophthalmic solution Place 2 drops into both eyes 3 (three) times daily as needed (redness).     Marland Kitchen ZETIA 10 MG tablet TAKE 1 TABLET (10 MG TOTAL) BY MOUTH DAILY. 30 tablet 5   No facility-administered medications prior to visit.     Allergies:   Review of patient's allergies indicates no known allergies.   Social History   Social History  . Marital Status: Married    Spouse Name: N/A  . Number of Children: 1  . Years of Education: N/A   Occupational History  . sales    Social History Main Topics  . Smoking status: Never Smoker   . Smokeless tobacco: Never Used  . Alcohol Use: 7.2 oz/week    12 Cans of beer per week  . Drug Use: No  . Sexual Activity: Not Asked   Other Topics Concern  . None   Social History Narrative     Family History:  The patient's family history includes Aneurysm in his brother; Epilepsy (age of onset: 65) in his brother; Stroke in his brother. There is no history of Heart attack.   ROS:   Please see the history of present illness.    Review of Systems  Musculoskeletal: Positive for muscle cramps and muscle weakness.  Neurological: Positive for dizziness.  Psychiatric/Behavioral: The patient is nervous/anxious.    All other systems reviewed and are negative.   PHYSICAL EXAM:   VS:  BP 130/100 mmHg  Pulse 82  Ht 5\' 10"  (1.778 m)  Wt 194 lb 12.8 oz (88.361 kg)   BMI 27.95 kg/m2   GEN: Well nourished, well developed, in no acute distress HEENT: normal Neck: no JVD, carotid bruits, or masses Cardiac: RRR; no murmurs, rubs, or gallops,no edema.  Intact distal pulses bilaterally.  Respiratory:  clear to auscultation bilaterally, normal work of breathing GI: soft, nontender, nondistended, + BS MS: no deformity or atrophy Skin: warm and dry, no rash Neuro:  Alert and Oriented x  3, Strength and sensation are intact Psych: euthymic mood, full affect  Wt Readings from Last 3 Encounters:  07/20/15 194 lb 12.8 oz (88.361 kg)  09/26/14 196 lb 9.6 oz (89.177 kg)  03/23/14 190 lb (86.183 kg)      Studies/Labs Reviewed:   EKG:  EKG is ordered today.  The ekg ordered today demonstrates NSR at 82bpm with anterior infarct  Recent Labs: No results found for requested labs within last 365 days.   Lipid Panel    Component Value Date/Time   CHOL 226* 05/17/2014 0940   TRIG 78.0 05/17/2014 0940   HDL 59.90 05/17/2014 0940   CHOLHDL 4 05/17/2014 0940   VLDL 15.6 05/17/2014 0940   LDLCALC 151* 05/17/2014 0940   LDLDIRECT 147.2 03/24/2013 1007    Additional studies/ records that were reviewed today include:  None    ASSESSMENT:    1. Coronary artery disease due to lipid rich plaque   2. Essential hypertension   3. Chronic diastolic CHF (congestive heart failure) ([place])   4. Hypercholesteremia   5. Hypersomnia   6. Claudication [facility])      PLAN:  In order of problems listed above:  1. ASCAD s/p DES to the proximal LAD in 03/2013 and repeat cath for ongoing CP showing widely patent LAD stent, moderate disease of the mid left circ not significant by FFR with adenosine and mild to moderate RCA with normal LVF. Nuclear stress test showed a very small area of ischemia in the inferior wall but on cath the RCA lesion did not appear >50% and was not amenable to flow wire for further assessment. He continues to have CP and DOE and feels terrible.  He  gets weakness in his legs with exertion.  I will get a nuclear stress to assess for any new areas of ischemia.  Continue ASA/Plavix/BB/Imdur 2. HTN - BP is controlled on current medical regimen.  Continue amlodipine/BB 3. Chronic diastolic CHF - he appears euvolemic on exam.  Continue BB 4. Hyperlipidemia - LDL goal is < 70.  Check FLP. 5. Hypersomnia - he has now agreed to sleep study - will arrange split night PSG. 6. Claudication - he has symptoms of muscle achiness and weakness to the point he cannot walk or exercise at all.  I will get LE dopplers to assess for PVD and check a CPK as well.   7.   Weakness - ? Etiology.  I am wondering whether he may be depressed.  I will check a BMET, CBC, TSH and LFTs.    Patient presented with multiple medical complaints today.  Time spent with patient care was 35 minutes with > 50% spent in direct patient care.   Medication Adjustments/Labs and Tests Ordered: Current medicines are reviewed at length with the patient today.  Concerns regarding medicines are outlined above.  Medication changes, Labs and Tests ordered today are listed in the Patient Instructions below.  Patient Instructions  Medication Instructions:  Your physician recommends that you continue on your current medications as directed. Please refer to the Current Medication list given to you today.   Labwork: TODAY: BMET, CBC, TSH, LFTs, Lipids, CPK  Testing/Procedures: Your physician has requested that you have a lower extremity arterial duplex. During this test, ultrasound is used to evaluate arterial blood flow in the legs. Allow one hour for this exam. There are no restrictions or special instructions.  Your physician has requested that you have a lexiscan myoview. For further information please visit HugeFiesta.[email or web address]. Please  follow instruction sheet, as given.  Your physician has recommended that you have a sleep study. This test records several body functions during sleep,  including: brain activity, eye movement, oxygen and carbon dioxide blood levels, heart rate and rhythm, breathing rate and rhythm, the flow of air through your mouth and nose, snoring, body muscle movements, and chest and belly movement.   Follow-Up: Your physician wants you to follow-up in: 6 months with a PA or NP. You will receive a reminder letter in the mail two months in advance. If you don't receive a letter, please call our office to schedule the follow-up appointment.   Your physician wants you to follow-up in: 1 year with Dr. Radford Pax. You will receive a reminder letter in the mail two months in advance. If you don't receive a letter, please call our office to schedule the follow-up appointment.  Any Other Special Instructions Will Be Listed Below (If Applicable).     If you need a refill on your cardiac medications before your next appointment, please call your pharmacy.       Lurena Nida, MD  07/20/2015 10:19 AM    [facility] [place], [place], [place]  [address] Phone: [phone]; Fax: [phone]

## 2015-07-31 ENCOUNTER — Other Ambulatory Visit: Payer: Self-pay | Admitting: Cardiology

## 2015-07-31 DIAGNOSIS — I739 Peripheral vascular disease, unspecified: Secondary | ICD-10-CM

## 2015-08-08 ENCOUNTER — Telehealth (HOSPITAL_COMMUNITY): Payer: Self-pay

## 2015-08-08 NOTE — Telephone Encounter (Signed)
Encounter complete. 

## 2015-08-10 ENCOUNTER — Ambulatory Visit (HOSPITAL_COMMUNITY)
Admission: RE | Admit: 2015-08-10 | Discharge: 2015-08-10 | Disposition: A | Discharge status: Home / Self Care | Payer: Medicare Other | Source: Ambulatory Visit | Attending: Cardiology | Admitting: Cardiology

## 2015-08-10 DIAGNOSIS — R079 Chest pain, unspecified: Secondary | ICD-10-CM | POA: Diagnosis not present

## 2015-08-10 DIAGNOSIS — I252 Old myocardial infarction: Secondary | ICD-10-CM | POA: Diagnosis not present

## 2015-08-10 DIAGNOSIS — Z6827 Body mass index (BMI) 27.0-27.9, adult: Secondary | ICD-10-CM | POA: Diagnosis not present

## 2015-08-10 DIAGNOSIS — E663 Overweight: Secondary | ICD-10-CM | POA: Insufficient documentation

## 2015-08-10 DIAGNOSIS — S46911A Strain of unspecified muscle, fascia and tendon at shoulder and upper arm level, right arm, initial encounter: Secondary | ICD-10-CM | POA: Diagnosis not present

## 2015-08-10 DIAGNOSIS — R0602 Shortness of breath: Secondary | ICD-10-CM | POA: Diagnosis not present

## 2015-08-10 DIAGNOSIS — I110 Hypertensive heart disease with heart failure: Secondary | ICD-10-CM | POA: Diagnosis not present

## 2015-08-10 DIAGNOSIS — I739 Peripheral vascular disease, unspecified: Secondary | ICD-10-CM | POA: Diagnosis not present

## 2015-08-10 DIAGNOSIS — I509 Heart failure, unspecified: Secondary | ICD-10-CM | POA: Diagnosis not present

## 2015-08-10 DIAGNOSIS — I2510 Atherosclerotic heart disease of native coronary artery without angina pectoris: Secondary | ICD-10-CM | POA: Insufficient documentation

## 2015-08-10 DIAGNOSIS — R5383 Other fatigue: Secondary | ICD-10-CM | POA: Insufficient documentation

## 2015-08-10 LAB — MYOCARDIAL PERFUSION IMAGING
CHL CUP RESTING HR STRESS: 81 {beats}/min
CSEPPHR: 99 {beats}/min
LVDIAVOL: 112 mL (ref 62–150)
LVSYSVOL: 46 mL
NUC STRESS TID: 1.13
SDS: 0
SRS: 0
SSS: 0

## 2015-08-10 MED ORDER — TECHNETIUM TC 99M TETROFOSMIN IV KIT
31.2000 | PACK | Freq: Once | INTRAVENOUS | Status: AC | PRN
  Administered 2015-08-10: 31.2 via INTRAVENOUS
  Filled 2015-08-10: qty 31

## 2015-08-10 MED ORDER — REGADENOSON 0.4 MG/5ML IV SOLN
0.4000 mg | Freq: Once | INTRAVENOUS | Status: AC
Start: 2015-08-10 — End: 2015-08-10
  Administered 2015-08-10: 0.4 mg via INTRAVENOUS

## 2015-08-10 MED ORDER — TECHNETIUM TC 99M TETROFOSMIN IV KIT
10.4000 | PACK | Freq: Once | INTRAVENOUS | Status: AC | PRN
  Administered 2015-08-10: 10.4 via INTRAVENOUS
  Filled 2015-08-10: qty 10

## 2015-08-17 ENCOUNTER — Ambulatory Visit (INDEPENDENT_AMBULATORY_CARE_PROVIDER_SITE_OTHER): Payer: Medicare Other | Admitting: Pharmacist

## 2015-08-17 ENCOUNTER — Encounter: Payer: Medicare Other | Admitting: Pharmacist

## 2015-08-17 DIAGNOSIS — E7800 Pure hypercholesterolemia, unspecified: Secondary | ICD-10-CM | POA: Diagnosis not present

## 2015-08-17 NOTE — Patient Instructions (Signed)
If you are interested in clinical trials, you can go to www.clinicaltrials.gov  1.  Praluent (injection) [employer]We[employer]nl?term=praluent&[email or web address]  Evaluating Effect of the Study Drug Praluent (Alirocumab) on Neurocognitive Function When Compared to Placebo   2.  Bempedoic Acid (tablet)  [employer]Mag[employer].ca?term=bempedoic+acid&rank=3  Evaluation of Major Cardiovascular Events in Patients With, or at [place] for, Cardiovascular Disease Who Are Statin Intolerant Treated With Bempedoic Acid (ETC-1002) or Placebo (CLEAR Outcomes)   If you are interested in either of these, please call Gay Filler at [phone]

## 2015-08-17 NOTE — Progress Notes (Signed)
Patient ID: Larry Arroyo                 DOB: 25-Jan-1951                    MRN: [id number]     HPI: Larry Arroyo is a 65 y.o. male patient referred to lipid clinic by Dr. Radford Pax.  He has a PMH significant for CAD s/p PCI to LAD, HTN and hyperlipidemia.  He is currently not taking any medication for his cholesterol.  He has had several intolerances to statins including Lipitor 80mg , 20mg  10mg  daily, simvastatin 40mg  daily and pravastatin 80mg  daily.  All of these caused myalgias without an elevation in CK that resolved upon discontinuation.  He did take Zetia but stopped taking it after he ran out of refills.    Pt comes in today stating he feels terrible- like he wants to "crawl under the bed" every morning.  He states this gets better throughout the day but seems to be worse in the AM when he takes his medications.  The only thing he takes in the AM that is different than the PM is his Celexa.    Current Medications: none Intolerances: Lipitor 10mg  (07/30/13- 03/30/14), Lipitor 20mg  (03/30/14-05/25/14), Lipitor 80mg  (dates unknown), Zetia 10mg  (05/25/14-07/20/15), pravastatin 40mg  (unknown start-03/29/13), pravastatin 80mg  (03/29/13-07/30/13), simvastatin 40mg  (dates unknown), pt reports taking Crestor but dose and dates unknown Risk Factors: CAD s/p PCI in 2011, HTN LDL goal: < 70 mg/dL, non-HDL < 100 mg/dL  Labs: 07/20/15- TC 233, TG 90, HDL 56, LDL 159 (on no lipid lowering therapy)  Past Medical History  Diagnosis Date  . Complication of anesthesia     16 yrs ago following hip replacement states took a long time for full memory to return  . Arthritis   . Anxiety   . Depression   . Sleep disturbance   . Hypertension     ADDENDUM- STRESS TEST 9/12 with LOV DR TURNER ON CHART  . Hypercholesteremia   . Myocardial infarction ([place])   . Anginal pain ([place])   . Colon polyps 02/05/2010  . Coronary artery disease     CAD with PCI of LAD with recent cath showing patent LAD stent with nonobstructive  ASCAD with 40-50% RCA, 60% D1, 70% inferior branche of D1  . Claudication ([place]) 07/20/2015    Current Outpatient Prescriptions on File Prior to Visit  Medication Sig Dispense Refill  . amLODipine (NORVASC) 5 MG tablet TAKE 1 TABLET (5 MG TOTAL) BY MOUTH DAILY. 30 tablet 6  . aspirin EC 81 MG tablet Take 1 tablet (81 mg total) by mouth daily.    . carvedilol (COREG) 12.5 MG tablet TAKE 1 TABLET (12.5 MG TOTAL) BY MOUTH 2 (TWO) TIMES DAILY WITH A MEAL. 60 tablet 11  . citalopram (CELEXA) 40 MG tablet Take 40 mg by mouth daily.     . clopidogrel (PLAVIX) 75 MG tablet Take 1 tablet (75 mg total) by mouth daily. 30 tablet 11  . cyclobenzaprine (FLEXERIL) 10 MG tablet Take 10 mg by mouth 3 (three) times daily as needed for muscle spasms.    . isosorbide mononitrate (IMDUR) 30 MG 24 hr tablet TAKE 1 TABLET (30 MG TOTAL) BY MOUTH DAILY. 90 tablet 3  . nitroGLYCERIN (NITROSTAT) 0.4 MG SL tablet Place 1 tablet (0.4 mg total) under the tongue every 5 (five) minutes as needed for chest pain. 25 tablet 3  . pantoprazole (PROTONIX) 40 MG tablet TAKE 1  TABLET (40 MG TOTAL) BY MOUTH DAILY. 30 tablet 7  . Psyllium (METAMUCIL) 28.3 % POWD Take 1 scoop by mouth 2 (two) times daily before a meal. To 8 oz. of juice or water    . tetrahydrozoline-zinc (VISINE-AC) 0.05-0.25 % ophthalmic solution Place 2 drops into both eyes 3 (three) times daily as needed (redness).      No current facility-administered medications on file prior to visit.    No Known Allergies  Assessment/Plan:  1.  Hyperlipidemia- Pt is very adamant he does not want to take any cholesterol medications.  We discussed options including restarting Zetia or pursing PCSK-9 inhibitor therapy.  He is concerned about cost of PCSK-9 inhibitor and does not want to pursue this.  We did discuss clinical trial options.  He would like to research these further.  Gave him this information.  He will call if he is interested in enrollment. Suggested he follow up  with PCP regarding his other symptoms and concerns.

## 2015-08-22 ENCOUNTER — Encounter (HOSPITAL_BASED_OUTPATIENT_CLINIC_OR_DEPARTMENT_OTHER): Payer: Medicare Other

## 2015-09-12 ENCOUNTER — Other Ambulatory Visit: Payer: Self-pay | Admitting: Cardiology

## 2015-09-12 ENCOUNTER — Ambulatory Visit (HOSPITAL_BASED_OUTPATIENT_CLINIC_OR_DEPARTMENT_OTHER): Payer: Medicare Other | Attending: Cardiology | Admitting: Cardiology

## 2015-09-12 ENCOUNTER — Other Ambulatory Visit: Payer: Self-pay | Admitting: Physician Assistant

## 2015-09-12 VITALS — Ht 70.0 in | Wt 187.0 lb

## 2015-09-12 DIAGNOSIS — Z0000 Encounter for general adult medical examination without abnormal findings: Secondary | ICD-10-CM | POA: Diagnosis not present

## 2015-09-12 DIAGNOSIS — M25519 Pain in unspecified shoulder: Secondary | ICD-10-CM | POA: Diagnosis not present

## 2015-09-12 DIAGNOSIS — G4719 Other hypersomnia: Secondary | ICD-10-CM | POA: Insufficient documentation

## 2015-09-12 DIAGNOSIS — G4733 Obstructive sleep apnea (adult) (pediatric): Secondary | ICD-10-CM | POA: Insufficient documentation

## 2015-09-12 DIAGNOSIS — I10 Essential (primary) hypertension: Secondary | ICD-10-CM | POA: Diagnosis not present

## 2015-09-12 DIAGNOSIS — R0683 Snoring: Secondary | ICD-10-CM | POA: Diagnosis not present

## 2015-09-12 DIAGNOSIS — E7800 Pure hypercholesterolemia, unspecified: Secondary | ICD-10-CM | POA: Diagnosis not present

## 2015-09-12 DIAGNOSIS — F324 Major depressive disorder, single episode, in partial remission: Secondary | ICD-10-CM | POA: Diagnosis not present

## 2015-09-12 DIAGNOSIS — I2510 Atherosclerotic heart disease of native coronary artery without angina pectoris: Secondary | ICD-10-CM | POA: Diagnosis not present

## 2015-09-14 ENCOUNTER — Telehealth: Payer: Self-pay | Admitting: Cardiology

## 2015-09-14 DIAGNOSIS — G4733 Obstructive sleep apnea (adult) (pediatric): Secondary | ICD-10-CM

## 2015-09-14 NOTE — Addendum Note (Signed)
Addended by: Andres Ege on: 09/14/2015 10:35 AM   Modules accepted: Orders

## 2015-09-14 NOTE — Telephone Encounter (Signed)
Patient informed of information. Stated verbal understanding.Larry Arroyo He is okay to proceed with Titration (After July 10th because he will be out of town). Patient is aware that I will call the sleep lab to schedule the appointment, and mail him a letter with the date.     ALSO : Dr Radford Pax,  Patient states that he has been unable to tolerate statins in the past, and he is questioning if you would be okay with him adding Red Yeast Rice into his medications, or would he have the same affect from the previous medications he has tried.  He stated that he had seen where people could benefit from adding this medication and he wondered if it would help him.     I let him know that I would forward this message and that I would call him to let him know what is said.

## 2015-09-14 NOTE — Procedures (Signed)
   Patient Name: Larry Arroyo, Larry Arroyo MRN: [id number] Study Date: 09/12/2015 Gender: Male D.O.B: 10-03-1950 Age (years): 65 Referring Provider: Fransico Him MD, ABSM Interpreting Physician: Fransico Him MD, ABSM RPSGT: Zadie Rhine  Weight (lbs): 187 Height (inches): 70 BMI: 27 Neck Size: 17.00  CLINICAL INFORMATION Sleep Study Type: NPSG Indication for sleep study: Excessive Daytime Sleepiness, Snoring Epworth Sleepiness Score: 0  SLEEP STUDY TECHNIQUE As per the AASM Manual for the Scoring of Sleep and Associated Events v2.3 (April 2016) with a hypopnea requiring 4% desaturations. The channels recorded and monitored were frontal, central and occipital EEG, electrooculogram (EOG), submentalis EMG (chin), nasal and oral airflow, thoracic and abdominal wall motion, anterior tibialis EMG, snore microphone, electrocardiogram, and pulse oximetry.  MEDICATIONS Patient's medications include: Reviewed in the chart. Medications self-administered by patient during sleep study : No sleep medicine administered.  SLEEP ARCHITECTURE The study was initiated at 10:44:09 PM and ended at 4:35:29 AM. Sleep onset time was 88.8 minutes and the sleep efficiency was markedly reduced at 31.5%. The total sleep time was low at 110.5 minutes. Stage REM was not achieved. The patient spent 24.89% of the night in stage N1 sleep, 75.11% in stage N2 sleep, 0.00% in stage N3 and 0.00% in REM. Alpha intrusion was absent. Supine sleep was 65.61%.  RESPIRATORY PARAMETERS The overall apnea/hypopnea index (AHI) was 19.0 per hour. There were 34 total apneas, including 32 obstructive, 2 central and 0 mixed apneas. There were 1 hypopneas and 31 RERAs. The AHI during Stage REM sleep was N/A per hour. AHI while supine was 22.3 per hour. The mean oxygen saturation was 93.30%. The minimum SpO2 during sleep was 89.00%. Moderate snoring was noted during this study.  CARDIAC DATA The 2 lead EKG demonstrated sinus rhythm.  The mean heart rate was 70.26 beats per minute. Other EKG findings include: None.  LEG MOVEMENT DATA The total PLMS were 54 with a resulting PLMS index of 29.32. Associated arousal with leg movement index was 3.8 .  IMPRESSIONS - Moderate obstructive sleep apnea occurred during this study (AHI = 19.0/h). - No significant central sleep apnea occurred during this study (CAI = 1.1/h). - The patient had minimal or no oxygen desaturation during the study (Min O2 = 89.00%) - The patient snored with Moderate snoring volume. - No cardiac abnormalities were noted during this study. - Moderate periodic limb movements of sleep occurred during the study. No significant associated arousals.  DIAGNOSIS - Obstructive Sleep Apnea (327.23 [G47.33 ICD-10])  RECOMMENDATIONS - Therapeutic CPAP titration to determine optimal pressure required to alleviate sleep disordered breathing. - Avoid alcohol, sedatives and other CNS depressants that may worsen sleep apnea and disrupt normal sleep architecture. - Sleep hygiene should be reviewed to assess factors that may improve sleep quality. - Weight management and regular exercise should be initiated or continued if appropriate.   [place], American Board of Sleep Medicine  ELECTRONICALLY SIGNED ON:  09/14/2015, 8:09 AM [place] PH: [phone]   FX: [phone] [place]

## 2015-09-14 NOTE — Telephone Encounter (Signed)
Please let patient know that they have sleep apnea and recommend CPAP titration. Please set up titration in the sleep lab. 

## 2015-09-19 NOTE — Telephone Encounter (Signed)
Please have patient avoid red yeast rice and set him up in lipid clinic

## 2015-09-20 NOTE — Telephone Encounter (Signed)
I advised patient of Dr Theodosia Blender request, and Patient stated that he had decided that he is not going to take the medication after finding out that there are some possible contraindications with medications he is already on.  He said that he went through the lipid clinic about two weeks ago and he does not want to do it again.  He stated that if he needed anything further with his medications he would call.

## 2015-09-20 NOTE — Telephone Encounter (Signed)
Left message for patient to call back  

## 2015-10-04 ENCOUNTER — Ambulatory Visit (HOSPITAL_BASED_OUTPATIENT_CLINIC_OR_DEPARTMENT_OTHER): Payer: Medicare Other | Attending: Cardiology | Admitting: Cardiology

## 2015-10-04 VITALS — Ht 70.0 in | Wt 187.0 lb

## 2015-10-04 DIAGNOSIS — G4733 Obstructive sleep apnea (adult) (pediatric): Secondary | ICD-10-CM | POA: Diagnosis not present

## 2015-10-10 ENCOUNTER — Other Ambulatory Visit: Payer: Self-pay | Admitting: *Deleted

## 2015-10-10 DIAGNOSIS — F324 Major depressive disorder, single episode, in partial remission: Secondary | ICD-10-CM | POA: Diagnosis not present

## 2015-10-10 MED ORDER — CARVEDILOL 12.5 MG PO TABS: 12.5000 mg | ORAL_TABLET | Freq: Two times a day (BID) | ORAL | 11 refills | Status: DC

## 2015-10-10 MED ORDER — AMLODIPINE BESYLATE 5 MG PO TABS: 5.0000 mg | ORAL_TABLET | Freq: Every day | ORAL | 11 refills | Status: DC

## 2015-10-10 NOTE — Addendum Note (Signed)
Addended by: Domenica Reamer R on: 10/10/2015 02:48 PM   Modules accepted: Orders

## 2015-10-12 ENCOUNTER — Telehealth: Payer: Self-pay | Admitting: Cardiology

## 2015-10-12 NOTE — Telephone Encounter (Signed)
New message   Pt verbalized that he is going out of town and he wants the test results of his sleep test

## 2015-10-12 NOTE — Telephone Encounter (Signed)
Spoke with patient who states he completed sleep study and then CPAP titration on 7/19.  He is wondering what is the next step.  I advised him that I will forward message to appropriate parties but Dr. Radford Pax, her primary nurse, and CMA are all out of the office this week.  I advised that someone will call him back as soon as possible next week.  He states he may be going out of town for a few weeks and to please call cell phone.

## 2015-10-19 NOTE — Procedures (Signed)
   Patient Name: Shanta, Kneece Date: 10/04/2015 Gender: Male D.O.B: 1950-04-09 Age (years): 55 Referring Provider: Fransico Him MD, ABSM Height (inches): 70 Interpreting Physician: Fransico Him MD, ABSM Weight (lbs): 187 RPSGT: Zadie Rhine BMI: 27 MRN: [id number] Neck Size: 17.00  CLINICAL INFORMATION The patient is referred for a CPAP titration to treat sleep apnea. Date of NPSG, Split Night or HST:09/12/2015  SLEEP STUDY TECHNIQUE As per the AASM Manual for the Scoring of Sleep and Associated Events v2.3 (April 2016) with a hypopnea requiring 4% desaturations. The channels recorded and monitored were frontal, central and occipital EEG, electrooculogram (EOG), submentalis EMG (chin), nasal and oral airflow, thoracic and abdominal wall motion, anterior tibialis EMG, snore microphone, electrocardiogram, and pulse oximetry. Continuous positive airway pressure (CPAP) was initiated at the beginning of the study and titrated to treat sleep-disordered breathing.  MEDICATIONS Medications taken by the patient : Reviewed in the chart Medications administered by patient during sleep study : No sleep medicine administered.  TECHNICIAN COMMENTS Comments added by technician: ONE RESTROOM VISTED. Patient had difficulty initiating sleep.  Comments added by scorer: N/A  RESPIRATORY PARAMETERS Optimal PAP Pressure (cm): 15  AHI at Optimal Pressure (/hr):2.7 Overall Minimal O2 (%):89%  Supine % at Optimal Pressure (%):27 Minimal O2 at Optimal Pressure (%):N/A   SLEEP ARCHITECTURE The study was initiated at 10:44:23 PM and ended at 4:37:25 AM. Sleep onset time was 41.8 minutes and the sleep efficiency was reduced at 66.7%. The total sleep time was 235.5 minutes. The patient spent 3.18% of the night in stage N1 sleep, 93.42% in stage N2 sleep, 0.00% in stage N3 and 3.40% in REM.Stage REM latency was prolonged at183.0 minutes Wake after sleep onset was 75.7. Alpha intrusion was absent.  Supine sleep was 43.31%.  CARDIAC DATA The 2 lead EKG demonstrated sinus rhythm. The mean heart rate was N/A beats per minute. Other EKG findings include: None.  LEG MOVEMENT DATA The total Periodic Limb Movements of Sleep (PLMS) were 451. The PLMS index was 114.90. A PLMS index of <15 is considered normal in adults.  IMPRESSIONS - The optimal PAP pressure was 15 cm of water. - Central sleep apnea was not noted during this titration (CAI = 0.5/h). - Minimal oxygen desaturations were observed during this titration (minimum O2 sat of 89%). - No snoring was audible during this study. - No cardiac abnormalities were observed during this study. - Severe periodic limb movements were observed during this study. Arousals associated with PLMs were significant.  DIAGNOSIS - Obstructive Sleep Apnea (327.23 [G47.33 ICD-10])  RECOMMENDATIONS - Trial of CPAP therapy on 15 cm H2O with a Medium size Resmed Full Face Mask AirFit F20 mask and heated humidification. - Avoid alcohol, sedatives and other CNS depressants that may worsen sleep apnea and disrupt normal sleep architecture. - Sleep hygiene should be reviewed to assess factors that may improve sleep quality. - Weight management and regular exercise should be initiated or continued. - Return to Sleep Center for re-evaluation after 10 weeks of therapy   [place], [place] [facility]  ELECTRONICALLY SIGNED ON:  10/19/2015, 5:48 PM [place] PH: [phone]   FX: [phone] [place]

## 2015-10-20 NOTE — Telephone Encounter (Signed)
Spoke with patient.  He is aware of his results and he is aware that [facility] will be contacting him to set up his machine. He was appreciative of the call.

## 2015-10-20 NOTE — Progress Notes (Signed)
Patient informed of information.  Stated verbal understanding.    Message sent to [facility] for new set up.

## 2015-10-20 NOTE — Telephone Encounter (Signed)
Left message for patient to call back  

## 2015-11-03 DIAGNOSIS — G4733 Obstructive sleep apnea (adult) (pediatric): Secondary | ICD-10-CM | POA: Diagnosis not present

## 2015-11-06 DIAGNOSIS — G4733 Obstructive sleep apnea (adult) (pediatric): Secondary | ICD-10-CM | POA: Diagnosis not present

## 2015-11-07 ENCOUNTER — Encounter (HOSPITAL_BASED_OUTPATIENT_CLINIC_OR_DEPARTMENT_OTHER): Payer: Medicare Other

## 2015-12-07 DIAGNOSIS — G4733 Obstructive sleep apnea (adult) (pediatric): Secondary | ICD-10-CM | POA: Diagnosis not present

## 2015-12-21 ENCOUNTER — Other Ambulatory Visit: Payer: Self-pay | Admitting: Cardiology

## 2016-01-06 DIAGNOSIS — G4733 Obstructive sleep apnea (adult) (pediatric): Secondary | ICD-10-CM | POA: Diagnosis not present

## 2016-01-08 ENCOUNTER — Telehealth: Payer: Self-pay | Admitting: Cardiology

## 2016-01-09 ENCOUNTER — Encounter: Payer: Self-pay | Admitting: Nurse Practitioner

## 2016-01-12 ENCOUNTER — Ambulatory Visit (INDEPENDENT_AMBULATORY_CARE_PROVIDER_SITE_OTHER): Payer: Medicare Other | Admitting: Cardiology

## 2016-01-12 ENCOUNTER — Encounter: Payer: Self-pay | Admitting: Cardiology

## 2016-01-12 VITALS — BP 144/88 | HR 83 | Ht 70.0 in | Wt 190.0 lb

## 2016-01-12 DIAGNOSIS — G4733 Obstructive sleep apnea (adult) (pediatric): Secondary | ICD-10-CM | POA: Diagnosis not present

## 2016-01-12 DIAGNOSIS — E7800 Pure hypercholesterolemia, unspecified: Secondary | ICD-10-CM

## 2016-01-12 DIAGNOSIS — I10 Essential (primary) hypertension: Secondary | ICD-10-CM

## 2016-01-12 DIAGNOSIS — R5383 Other fatigue: Secondary | ICD-10-CM

## 2016-01-12 HISTORY — DX: Obstructive sleep apnea (adult) (pediatric): G47.33

## 2016-01-12 LAB — HEPATIC FUNCTION PANEL
ALBUMIN: 4.3 g/dL (ref 3.6–5.1)
ALK PHOS: 75 U/L (ref 40–115)
ALT: 24 U/L (ref 9–46)
AST: 21 U/L (ref 10–35)
BILIRUBIN DIRECT: 0.1 mg/dL (ref <=0.2)
BILIRUBIN INDIRECT: 0.5 mg/dL (ref 0.2–1.2)
BILIRUBIN TOTAL: 0.6 mg/dL (ref 0.2–1.2)
Total Protein: 7.4 g/dL (ref 6.1–8.1)

## 2016-01-12 LAB — LIPID PANEL
CHOL/HDL RATIO: 3.8 ratio (ref <=5.0)
Cholesterol: 256 mg/dL — ABNORMAL HIGH (ref 125–200)
HDL: 67 mg/dL (ref >=40)
LDL CALC: 176 mg/dL — AB (ref <130)
Triglycerides: 64 mg/dL (ref <150)
VLDL: 13 mg/dL (ref <30)

## 2016-01-12 NOTE — Progress Notes (Signed)
Cardiology Office Note    Date:  01/12/2016   ID:  Larry Arroyo, Larry Arroyo 08-Feb-1951, MRN [id number]  PCP:  Melinda Crutch, MD  Cardiologist:  Fransico Him, MD   Chief Complaint  Patient presents with  . Sleep Apnea  . Hypertension  . Hyperlipidemia    History of Present Illness:  Larry Arroyo is a 66 y.o. male with a hx of CAD, HTN, HL, OSA.  He now presents back for followup of newly diagnosed OSA.  When I last saw him he was complaining of fatigue and a sleep study was done which showed moderate OSA with an AHI of 19/hr and underwent CPAP titration to 15cm H2O.  He is doing well with his CPAP device.  He tolerates the full face mask and feels the pressure is adequate.  Since starting on the CPAP he has had less daytime sleepiness, improvement in fatigue and feels rested in the am.      Past Medical History:  Diagnosis Date  . Anginal pain ([place])   . Anxiety   . Arthritis   . Claudication ([place]) 07/20/2015  . Colon polyps 02/05/2010  . Complication of anesthesia    16 yrs ago following hip replacement states took a long time for full memory to return  . Coronary artery disease    CAD with PCI of LAD with recent cath showing patent LAD stent with nonobstructive ASCAD with 40-50% RCA, 60% D1, 70% inferior branche of D1  . Depression   . Hypercholesteremia   . Hypertension    ADDENDUM- STRESS TEST 9/12 with LOV DR Andranik Jeune ON CHART  . Myocardial infarction   . OSA (obstructive sleep apnea) 01/12/2016   Moderate with AHI 19/hr now on CPAP at 15cm H2O  . Sleep disturbance     Past Surgical History:  Procedure Laterality Date  . CARDIAC CATHETERIZATION     nonobstructive ASCAD of the RCA of 40-50%, 60% D1, and 70% inferior branch of D1  . CORONARY ANGIOPLASTY WITH STENT PLACEMENT  01/272015   FFR and 3.0 x 38 mm [place] DES to the LAD  . LEFT HEART CATHETERIZATION WITH CORONARY ANGIOGRAM N/A 04/13/2013   Procedure: LEFT HEART CATHETERIZATION WITH CORONARY ANGIOGRAM;  Surgeon: Sueanne Margarita, MD;  Location: [facility] CATH LAB;  Service: Cardiovascular;  Laterality: N/A;  . LEFT HEART CATHETERIZATION WITH CORONARY ANGIOGRAM N/A 05/12/2013   Procedure: LEFT HEART CATHETERIZATION WITH CORONARY ANGIOGRAM;  Surgeon: Burnell Blanks, MD;  Location[facility] CATH LAB;  Service: Cardiovascular;  Laterality: N/A;  . LEFT HEART CATHETERIZATION WITH CORONARY ANGIOGRAM N/A 12/03/2013   Procedure: LEFT HEART CATHETERIZATION WITH CORONARY ANGIOGRAM;  Surgeon: Jettie Booze, MD;  Location: [facility] CATH LAB;  Service: Cardiovascular;  Laterality: N/A;  . TOTAL HIP ARTHROPLASTY  02/11/2011   Procedure: TOTAL HIP ARTHROPLASTY;  Surgeon: Dione Plover Aluisio;  Location: [facility] ORS;  Service: Orthopedics;  Laterality: Right;  . TOTAL HIP ARTHROPLASTY Left     Current Medications: Outpatient Medications Prior to Visit  Medication Sig Dispense Refill  . amLODipine (NORVASC) 5 MG tablet Take 1 tablet (5 mg total) by mouth daily. 30 tablet 11  . aspirin EC 81 MG tablet Take 1 tablet (81 mg total) by mouth daily.    . carvedilol (COREG) 12.5 MG tablet Take 1 tablet (12.5 mg total) by mouth 2 (two) times daily with a meal. 60 tablet 11  . clopidogrel (PLAVIX) 75 MG tablet TAKE 1 TABLET (75 MG TOTAL) BY MOUTH DAILY. [place]  tablet 11  . isosorbide mononitrate (IMDUR) 30 MG 24 hr tablet TAKE 1 TABLET (30 MG TOTAL) BY MOUTH DAILY. 90 tablet 1  . nitroGLYCERIN (NITROSTAT) 0.4 MG SL tablet Place 1 tablet (0.4 mg total) under the tongue every 5 (five) minutes as needed for chest pain. 25 tablet 3  . pantoprazole (PROTONIX) 40 MG tablet TAKE 1 TABLET (40 MG TOTAL) BY MOUTH DAILY. 30 tablet 7  . Psyllium (METAMUCIL) 28.3 % POWD Take 1 scoop by mouth 2 (two) times daily before a meal. To 8 oz. of juice or water    . tetrahydrozoline-zinc (VISINE-AC) 0.05-0.25 % ophthalmic solution Place 2 drops into both eyes 3 (three) times daily as needed (redness).     . citalopram (CELEXA) 40 MG tablet Take 40 mg by mouth daily.      No  facility-administered medications prior to visit.      Allergies:   Review of patient's allergies indicates no known allergies.   Social History   Social History  . Marital status: Married    Spouse name: N/A  . Number of children: 1  . Years of education: N/A   Occupational History  . sales Electrical engineer   Social History Main Topics  . Smoking status: Never Smoker  . Smokeless tobacco: Never Used  . Alcohol use 7.2 oz/week    12 Cans of beer per week  . Drug use: No  . Sexual activity: Not Asked   Other Topics Concern  . None   Social History Narrative  . None     Family History:  The patient's family history includes Aneurysm in his brother; Epilepsy (age of onset: 48) in his brother; Stroke in his brother.   ROS:   Please see the history of present illness.    ROS All other systems reviewed and are negative.  No flowsheet data found.     PHYSICAL EXAM:   VS:  BP (!) 144/88   Pulse 83   Ht 5\' 10"  (1.778 m)   Wt 190 lb (86.2 kg)   SpO2 97%   BMI 27.26 kg/m    GEN: Well nourished, well developed, in no acute distress  HEENT: normal  Neck: no JVD, carotid bruits, or masses Cardiac: RRR; no murmurs, rubs, or gallops,no edema.  Intact distal pulses bilaterally.  Respiratory:  clear to auscultation bilaterally, normal work of breathing GI: soft, nontender, nondistended, + BS MS: no deformity or atrophy  Skin: warm and dry, no rash Neuro:  Alert and Oriented x 3, Strength and sensation are intact Psych: euthymic mood, full affect  Wt Readings from Last 3 Encounters:  01/12/16 190 lb (86.2 kg)  10/04/15 187 lb (84.8 kg)  09/12/15 187 lb (84.8 kg)      Studies/Labs Reviewed:   EKG:  EKG is not ordered today.    Recent Labs: 07/20/2015: ALT 24; BUN 16; Creat 1.04; Hemoglobin 15.2; Platelets 217; Potassium 4.5; Sodium 136; TSH 1.29   Lipid Panel    Component Value Date/Time   CHOL 233 (H) 07/20/2015 1053   TRIG 90 07/20/2015 1053   HDL 56 07/20/2015  1053   CHOLHDL 4.2 07/20/2015 1053   VLDL 18 07/20/2015 1053   LDLCALC 159 (H) 07/20/2015 1053   LDLDIRECT 147.2 03/24/2013 1007    Additional studies/ records that were reviewed today include:  CPAP download.    ASSESSMENT:    1. OSA (obstructive sleep apnea)   2. Essential hypertension   3. Fatigue, unspecified type  4. Hypercholesteremia      PLAN:  In order of problems listed above:  1. HTN - BP controlled on current meds. Continue BB/amlodipine. OSA - the patient is tolerating PAP therapy well without any problems. The PAP download was reviewed today and showed an AHI of 3.9/hr /hr on 15 cm H2O with 70% compliance in using more than 4 hours nightly.  The patient has been using and benefiting from CPAP use and will continue to benefit from therapy. He would like to try a nasal pillow mask so I will order this.  Fatigue - he has had a significant improvement in his daytime fatigue since starting CPAP.  4.   Hyperlipidemia - LDL goal < 70.  Check FLP and ALT. Today.     Medication Adjustments/Labs and Tests Ordered: Current medicines are reviewed at length with the patient today.  Concerns regarding medicines are outlined above.  Medication changes, Labs and Tests ordered today are listed in the Patient Instructions below.  There are no Patient Instructions on file for this visit.   Signed, Fransico Him, MD  01/12/2016 11:41 AM    [facility] [address], [place], [place]  [address] Phone: [phone]; Fax: [phone]

## 2016-01-12 NOTE — Patient Instructions (Addendum)
Medication Instructions:  Your physician recommends that you continue on your current medications as directed. Please refer to the Current Medication list given to you today.   Labwork: TODAY: LFTs, Lipids  Testing/Procedures: None  Follow-Up: Your physician wants you to follow-up in: 6 months with Dr. Radford Pax. You will receive a reminder letter in the mail two months in advance. If you don't receive a letter, please call our office to schedule the follow-up appointment.   Any Other Special Instructions Will Be Listed Below (If Applicable). A new mask has been ordered for you. Please call our office if you do not hear from [facility] soon.    If you need a refill on your cardiac medications before your next appointment, please call your pharmacy.

## 2016-01-17 NOTE — Telephone Encounter (Signed)
Closed Encounter  °

## 2016-01-23 ENCOUNTER — Ambulatory Visit: Payer: Medicare Other | Admitting: Nurse Practitioner

## 2016-01-26 ENCOUNTER — Telehealth: Payer: Self-pay | Admitting: Cardiology

## 2016-01-26 DIAGNOSIS — G4733 Obstructive sleep apnea (adult) (pediatric): Secondary | ICD-10-CM

## 2016-01-26 NOTE — Telephone Encounter (Signed)
New message  RX to switch to pillow mask P10 or N20, questioning which one  Insurance only covers head strap every 6 mos.  Needs statement that states that it is medically necessary to switch to this type of mask  [phone] fax#

## 2016-01-28 NOTE — Telephone Encounter (Signed)
Please find out specific issue with full face mask that he wants to switch to nasal or nasal pillow mask so we can document in a note for insurance to pay and find out if he wants pillow mask or nasal mask

## 2016-01-29 ENCOUNTER — Other Ambulatory Visit: Payer: Self-pay | Admitting: *Deleted

## 2016-01-29 NOTE — Telephone Encounter (Signed)
Patient complains that the mask is irritating his mouth and nose. He would like to have the nasal mask

## 2016-01-29 NOTE — Telephone Encounter (Signed)
Patient intolerant to full face mask.  Please order ResMed Airfit P20 mask with chin strap and get a download in 4 weeks

## 2016-01-30 DIAGNOSIS — Z23 Encounter for immunization: Secondary | ICD-10-CM | POA: Diagnosis not present

## 2016-01-30 NOTE — Telephone Encounter (Signed)
Order in.

## 2016-01-31 ENCOUNTER — Telehealth: Payer: Self-pay | Admitting: Cardiology

## 2016-01-31 NOTE — Telephone Encounter (Signed)
Larry Arroyo is calling because she states she does have a mask P20, she does have a N20 or P10. In the order it must states this is medically necessary for the insurance to pay for it because the patient just received one back in Aug.

## 2016-02-01 NOTE — Telephone Encounter (Signed)
Please review the noted below it does not make sense

## 2016-02-02 NOTE — Telephone Encounter (Signed)
I spoke to [place] at [facility] she said the order ask for P20 mask but this does not exist. She ask did you mean P10 pillow mask or N20 nasal mask. Insurance will only pay for a mask every 90 days and only pay for head gear every 6 months, he got both on November 03, 2015.  For him to switch from that P20 mask order that is incorrect  you have to write that it is Medically Necessary for him to switch to the Resmed Airfit  N20 nasal mask then [facility] will cover the cost of the head strap and then bill his insurance for the mask.

## 2016-02-05 NOTE — Telephone Encounter (Signed)
Spoke to the patient and let him know that our office and [place] [facility] are working together to get his mask to him asap

## 2016-02-05 NOTE — Telephone Encounter (Signed)
Patient wanted to follow-up about new mask.  To CPAP assistant.

## 2016-02-06 DIAGNOSIS — G4733 Obstructive sleep apnea (adult) (pediatric): Secondary | ICD-10-CM | POA: Diagnosis not present

## 2016-02-11 ENCOUNTER — Encounter: Payer: Self-pay | Admitting: Cardiology

## 2016-02-27 DIAGNOSIS — G4733 Obstructive sleep apnea (adult) (pediatric): Secondary | ICD-10-CM | POA: Diagnosis not present

## 2016-03-07 ENCOUNTER — Telehealth: Payer: Self-pay | Admitting: Cardiology

## 2016-03-07 NOTE — Telephone Encounter (Signed)
Patient's wife called to report he is sleeping much better after getting new mask. She simply states he's frustrated with [employer] and the "whole process." He received a denial letter today for denial of coverage for CPAP machine stating it was never pre-authorized and is very upset. They request a call TOMORROW from CPAP assistant to discuss why this occurred.

## 2016-03-07 NOTE — Telephone Encounter (Signed)
New Message  Pts wife voiced they're having cpap issues with facail mask  Pts wife voiced nose piece is making it better  Pts wife voiced pt is getting frustrated which is causing both to get frustrated. Pt voiced he's going to give the whole thing back.  Pts wife voice she would like to speak to someand would like to speak someone about pt receiving letter stating payment denied due to prior authorizaiton not obtained.  Please f/u with pt

## 2016-03-08 ENCOUNTER — Telehealth: Payer: Self-pay | Admitting: *Deleted

## 2016-03-08 NOTE — Telephone Encounter (Signed)
I called  [employer] and spoke to Amy who told me that cpaps cost less than $1,000 so they are authorized after set up. She connected me to  [name] who is in charge of all [employer] claims and she told me that [name] was approved for his cpap machine on November 21 and that he actually purchased his cpap at that time and his authorization # is  [id number]. I called [name] and explained everything to him and gave him [name]'s number and extension to reach out to her for any further questions.

## 2016-03-21 ENCOUNTER — Telehealth: Payer: Self-pay | Admitting: *Deleted

## 2016-03-21 NOTE — Telephone Encounter (Signed)
-----   Message from [place] sent at 03/21/2016  4:18 PM EST ----- Regarding: RE: CORRECT PATIENT Here is the last note in his account from 03/13/2016.  called patient and left message with my extension. I will  let him know that [facility] denied  the purchase of the cpap in error and will reprocess claim dos 02/06/16  ----- Message ----- From: Freada Bergeron, CMA Sent: 03/21/2016   3:53 PM To: Melissa Stenson Subject: RE: CORRECT PATIENT                            Hey what got decided about this man, do you have any idea ----- Message ----- From: Darlina Guys Sent: 03/08/2016   4:48 PM To: Freada Bergeron, CMA, Theodoro Parma, RN Subject: RE: CORRECT PATIENT                            We are still checking on this.  So far, we don't see any problem on our end.  I'll get back to you next week with more info. Thanks  ----- Message ----- From: Theodoro Parma, RN Sent: 03/07/2016   5:20 PM To: Darlina Guys, Freada Bergeron, CMA Subject: CORRECT PATIENT                                This patient called today upset because he received a denial of coverage letter for his CPAP. Will you home someone look into this please and let Gae Bon know so she can call with an update tomorrow?   Thanks!

## 2016-03-25 ENCOUNTER — Encounter: Payer: Self-pay | Admitting: Cardiology

## 2016-06-12 DIAGNOSIS — G4733 Obstructive sleep apnea (adult) (pediatric): Secondary | ICD-10-CM | POA: Diagnosis not present

## 2016-06-21 ENCOUNTER — Encounter: Payer: Self-pay | Admitting: Physician Assistant

## 2016-07-03 ENCOUNTER — Telehealth: Payer: Self-pay | Admitting: Emergency Medicine

## 2016-07-03 ENCOUNTER — Ambulatory Visit (INDEPENDENT_AMBULATORY_CARE_PROVIDER_SITE_OTHER): Payer: Medicare Other | Admitting: Physician Assistant

## 2016-07-03 ENCOUNTER — Encounter: Payer: Self-pay | Admitting: Physician Assistant

## 2016-07-03 VITALS — BP 150/90 | HR 88 | Ht 70.0 in | Wt 193.4 lb

## 2016-07-03 DIAGNOSIS — Z1211 Encounter for screening for malignant neoplasm of colon: Secondary | ICD-10-CM | POA: Diagnosis not present

## 2016-07-03 DIAGNOSIS — Z86010 Personal history of colonic polyps: Secondary | ICD-10-CM | POA: Diagnosis not present

## 2016-07-03 DIAGNOSIS — Z7901 Long term (current) use of anticoagulants: Secondary | ICD-10-CM | POA: Diagnosis not present

## 2016-07-03 MED ORDER — NA SULFATE-K SULFATE-MG SULF 17.5-3.13-1.6 GM/177ML PO SOLN: 1.0000 | ORAL | 0 refills | Status: DC

## 2016-07-03 NOTE — Patient Instructions (Signed)

## 2016-07-03 NOTE — Telephone Encounter (Signed)
OK to hold ASA and plavix for colonoscopy

## 2016-07-03 NOTE — Progress Notes (Signed)
Subjective:    Patient ID: Larry Arroyo, male    DOB: 1950-06-23, 66 y.o.   MRN: [id number]  HPI Larry Arroyo is a pleasant 66 year old white male known to Dr. Fuller Plan who comes in today to discuss recall colonoscopy. Patient is maintained on Plavix and aspirin for history of coronary artery disease status post drug-eluting stent to the LAD 2015. He is followed by Dr. Golden Hurter. He also has history of congestive heart failure with EF 55-60%, obstructive sleep apnea with CPAP use and claudication. Patient last had colonoscopy in November 2011 finding of a 11 mm polyp in the ascending colon which was a tubular adenoma. Also with moderate diverticulosis in the sigmoid and transverse colon. Patient has no current GI complaints, specifically no complaints of abdominal discomfort, changes in bowel habits melena ,or hematochezia  Review of Systems Pertinent positive and negative review of systems were noted in the above HPI section.  All other review of systems was otherwise negative.  Outpatient Encounter Prescriptions as of 07/03/2016  Medication Sig  . amLODipine (NORVASC) 5 MG tablet Take 1 tablet (5 mg total) by mouth daily.  Marland Kitchen aspirin EC 81 MG tablet Take 1 tablet (81 mg total) by mouth daily.  . carvedilol (COREG) 12.5 MG tablet Take 1 tablet (12.5 mg total) by mouth 2 (two) times daily with a meal.  . clopidogrel (PLAVIX) 75 MG tablet TAKE 1 TABLET (75 MG TOTAL) BY MOUTH DAILY.  . DULoxetine (CYMBALTA) 60 MG capsule Take 60 mg by mouth daily.  . isosorbide mononitrate (IMDUR) 30 MG 24 hr tablet TAKE 1 TABLET (30 MG TOTAL) BY MOUTH DAILY.  . nitroGLYCERIN (NITROSTAT) 0.4 MG SL tablet Place 1 tablet (0.4 mg total) under the tongue every 5 (five) minutes as needed for chest pain.  . pantoprazole (PROTONIX) 40 MG tablet TAKE 1 TABLET (40 MG TOTAL) BY MOUTH DAILY.  Marland Kitchen tetrahydrozoline-zinc (VISINE-AC) 0.05-0.25 % ophthalmic solution Place 2 drops into both eyes 3 (three) times daily as needed (redness).    . Na Sulfate-K Sulfate-Mg Sulf (SUPREP BOWEL PREP KIT) 17.5-3.13-1.6 GM/180ML SOLN Take 1 kit by mouth as directed.  . [DISCONTINUED] Psyllium (METAMUCIL) 28.3 % POWD Take 1 scoop by mouth 2 (two) times daily before a meal. To 8 oz. of juice or water   No facility-administered encounter medications on file as of 07/03/2016.    No Known Allergies Patient Active Problem List   Diagnosis Date Noted  . OSA (obstructive sleep apnea) 01/12/2016  . Fatigue 01/12/2016  . Claudication ([place]) 07/20/2015  . Dizziness 12/01/2013  . Chronic diastolic CHF (congestive heart failure) ([place]) 12/01/2013  . Hypersomnia 03/24/2013  . Coronary artery disease   . Hypertension   . Hypercholesteremia   . Hyponatremia 02/12/2011  . Osteoarthritis of right hip 02/12/2011   Social History   Social History  . Marital status: Married    Spouse name: N/A  . Number of children: 1  . Years of education: N/A   Occupational History  . sales Electrical engineer   Social History Main Topics  . Smoking status: Never Smoker  . Smokeless tobacco: Never Used  . Alcohol use 7.2 oz/week    12 Cans of beer per week  . Drug use: No  . Sexual activity: Not on file   Other Topics Concern  . Not on file   Social History Narrative  . No narrative on file    Mr. Phung family history includes Aneurysm in his brother; Epilepsy (age of onset: 49)  in his brother; Stroke in his brother.      Objective:    Vitals:   07/03/16 0906  BP: (!) 150/90  Pulse: 88    Physical Exam  well-developed older white male in no acute distress, pleasant blood pressure 150/90 pulse 88, height 5 foot 10, weight 193, BMI 27.7. HEENT; nontraumatic normocephalic EOMI PERRLA sclerae anicteric, Cardiovascular; regular rate and rhythm with S1-S2 no murmur or gallop, Pulmonary ;clear bilaterally, Abdomen; soft, nontender nondistended bowel sounds active no palpable mass or hepatosplenomegaly, Rectal; exam not done Neuropsych; mood and affect  appropriate       Assessment & Plan:   #58 66 year old white male with history of adenomatous colon polyps, overdue for follow-up colonoscopy. Last exam November 2011. #2 Chronic antiplatelet therapy with Plavix and aspirin #3 coronary artery disease status post drug-eluting stent LAD 2015 #4 congestive heart failure EF 55-60% #5's obstructive sleep apnea with CPAP use, no oxygen #6 claudication   Plan; patient will be scheduled for colonoscopy with Dr. Fuller Plan. Procedure discussed in detail with patient including risks and benefits and he is agreeable to proceed. He will need to hold Plavix for 5 days prior to the procedure.  Rationale for this explained in detail to the patient, including risks. We will communicate with his cardiologist Dr. Fransico Him to assure that holding antiplatelet therapy is reasonable for this patient. Amy Genia Harold PA-C 07/03/2016   Cc: [name], MD

## 2016-07-03 NOTE — Telephone Encounter (Signed)
Larry Arroyo 10/08/1950 [id number]  Dear Dr. Radford Pax:  We have scheduled the above named patient for a(n) colonoscopy procedure. Our records show that (s)he is on anticoagulation therapy.  Please advise as to whether the patient may come of their therapy of Plavix 5 days prior to their procedure which is scheduled for 08-14-16.  Please route your response to Tinnie Gens, CMA or fax response to [phone].  Sincerely,    [facility]

## 2016-07-03 NOTE — Progress Notes (Signed)
Reviewed and agree with management plan.  Kavan Devan T. Husna Krone, MD FACG 

## 2016-07-04 NOTE — Telephone Encounter (Signed)
Left detailed message on patient's voice mail.

## 2016-07-21 ENCOUNTER — Encounter: Payer: Self-pay | Admitting: Cardiology

## 2016-07-23 DIAGNOSIS — H524 Presbyopia: Secondary | ICD-10-CM | POA: Diagnosis not present

## 2016-07-24 ENCOUNTER — Ambulatory Visit (INDEPENDENT_AMBULATORY_CARE_PROVIDER_SITE_OTHER): Payer: Medicare Other | Admitting: Cardiology

## 2016-07-24 ENCOUNTER — Encounter: Payer: Self-pay | Admitting: Cardiology

## 2016-07-24 VITALS — BP 150/88 | HR 85 | Ht 70.0 in | Wt 193.5 lb

## 2016-07-24 DIAGNOSIS — I5032 Chronic diastolic (congestive) heart failure: Secondary | ICD-10-CM | POA: Diagnosis not present

## 2016-07-24 DIAGNOSIS — I10 Essential (primary) hypertension: Secondary | ICD-10-CM

## 2016-07-24 DIAGNOSIS — E7800 Pure hypercholesterolemia, unspecified: Secondary | ICD-10-CM | POA: Diagnosis not present

## 2016-07-24 DIAGNOSIS — G4733 Obstructive sleep apnea (adult) (pediatric): Secondary | ICD-10-CM

## 2016-07-24 DIAGNOSIS — I25119 Atherosclerotic heart disease of native coronary artery with unspecified angina pectoris: Secondary | ICD-10-CM

## 2016-07-24 DIAGNOSIS — R0602 Shortness of breath: Secondary | ICD-10-CM | POA: Diagnosis not present

## 2016-07-24 MED ORDER — ISOSORBIDE MONONITRATE ER 60 MG PO TB24: 60.0000 mg | ORAL_TABLET | Freq: Every day | ORAL | 3 refills | Status: DC

## 2016-07-24 NOTE — Patient Instructions (Signed)
Medication Instructions:  1) INCREASE IMDUR to 60 mg daily  Labwork: TODAY: BMET, LFTs, BNP, Lipids  Testing/Procedures: Your physician has requested that you have an echocardiogram. Echocardiography is a painless test that uses sound waves to create images of your heart. It provides your doctor with information about the size and shape of your heart and how well your heart's chambers and valves are working. This procedure takes approximately one hour. There are no restrictions for this procedure.  Dr. Radford Pax recommends you have a NUCLEAR STRESS TEST.  Follow-Up: Your physician wants you to follow-up in: 6 months with Dr. Theodosia Blender assistant. You will receive a reminder letter in the mail two months in advance. If you don't receive a letter, please call our office to schedule the follow-up appointment.   Your physician wants you to follow-up in: 1 year with Dr. Radford Pax. You will receive a reminder letter in the mail two months in advance. If you don't receive a letter, please call our office to schedule the follow-up appointment.   Any Other Special Instructions Will Be Listed Below (If Applicable).     If you need a refill on your cardiac medications before your next appointment, please call your pharmacy.

## 2016-07-24 NOTE — Progress Notes (Signed)
Cardiology Office Note    Date:  07/24/2016   ID:  Larry Arroyo, DOB 10-15-1950, MRN [id number]  PCP:  [name], MD  Cardiologist:  Fransico Him, MD   Chief Complaint  Patient presents with  . Follow-up    CAD, HTN, OSA    History of Present Illness:  Larry Arroyo is a 66 y.o. male with a hx of CAD, HTN, HL, OSA.  He now presents today for followup and is doing well.  He has moderate OSA with an AHI of 19/hr and is on CPAP at 15cm H2O.  He has been having some problems with his CPAP device.  He tolerates the full face mask and feels the pressure is adequate but wakes up with the tubing wrapped around him. He has had less daytime sleepiness, improvement in fatigue and feels rested in the am.  He has chronic stable anginal chest pain when he really pushes himself and takes a SL NTG and discomfort stops.  He has been very stressed out and is getting ready to retire.  He has had some memory issues.  He says that recently any activity that he does seems to be harder such as walking up stairs, walking to the mailbox.  He says that he He has noticed increased SOB and DOE with normal activities such as going up stairs and walking to the mailbox.  He can walk his dog with no problems but anything further than that and he has symptoms.  He denies LE edema, dizziness, palpitations (except with exertion), PND, orthopnea or syncope.    Past Medical History:  Diagnosis Date  . Anxiety   . Arthritis   . Chronic stable angina ([place])   . Claudication ([place]) 07/20/2015  . Colon polyps 02/05/2010  . Complication of anesthesia    16 yrs ago following hip replacement states took a long time for full memory to return  . Coronary artery disease    CAD with PCI of LAD with recent cath showing patent LAD stent with nonobstructive ASCAD with 40-50% RCA, 60% D1, 70% inferior branche of D1  . Depression   . Hypercholesteremia   . Hypertension   . Myocardial infarction ([place])   . OSA (obstructive  sleep apnea) 01/12/2016   Moderate with AHI 19/hr now on CPAP at 15cm H2O    Past Surgical History:  Procedure Laterality Date  . CARDIAC CATHETERIZATION     nonobstructive ASCAD of the RCA of 40-50%, 60% D1, and 70% inferior branch of D1  . CORONARY ANGIOPLASTY WITH STENT PLACEMENT  01/272015   FFR and 3.0 x 38 mm [place] DES to the LAD  . LEFT HEART CATHETERIZATION WITH CORONARY ANGIOGRAM N/A 04/13/2013   Procedure: LEFT HEART CATHETERIZATION WITH CORONARY ANGIOGRAM;  Surgeon: Sueanne Margarita, MD;  Location: [facility] CATH LAB;  Service: Cardiovascular;  Laterality: N/A;  . LEFT HEART CATHETERIZATION WITH CORONARY ANGIOGRAM N/A 05/12/2013   Procedure: LEFT HEART CATHETERIZATION WITH CORONARY ANGIOGRAM;  Surgeon: Burnell Blanks, MD;  Location: [facility] CATH LAB;  Service: Cardiovascular;  Laterality: N/A;  . LEFT HEART CATHETERIZATION WITH CORONARY ANGIOGRAM N/A 12/03/2013   Procedure: LEFT HEART CATHETERIZATION WITH CORONARY ANGIOGRAM;  Surgeon: Jettie Booze, MD;  [facility] C[facility] LAB;  Service: Cardiovascular;  Laterality: N/A;  . TOTAL HIP ARTHROPLASTY  02/11/2011   Procedure: TOTAL HIP ARTHROPLASTY;  Surgeon: Dione Plover Aluisio;  Location: [facility] ORS;  Service: Orthopedics;  Laterality: Right;  . TOTAL HIP ARTHROPLASTY Left  Current Medications: Current Meds  Medication Sig  . amLODipine (NORVASC) 5 MG tablet Take 1 tablet (5 mg total) by mouth daily.  Marland Kitchen aspirin EC 81 MG tablet Take 1 tablet (81 mg total) by mouth daily.  . carvedilol (COREG) 12.5 MG tablet Take 1 tablet (12.5 mg total) by mouth 2 (two) times daily with a meal.  . clopidogrel (PLAVIX) 75 MG tablet TAKE 1 TABLET (75 MG TOTAL) BY MOUTH DAILY.  . DULoxetine (CYMBALTA) 60 MG capsule Take 60 mg by mouth daily.  . isosorbide mononitrate (IMDUR) 30 MG 24 hr tablet TAKE 1 TABLET (30 MG TOTAL) BY MOUTH DAILY.  . Na Sulfate-K Sulfate-Mg Sulf (SUPREP BOWEL PREP KIT) 17.5-3.13-1.6 GM/180ML SOLN Take 1 kit by mouth as directed.  .  nitroGLYCERIN (NITROSTAT) 0.4 MG SL tablet Place 1 tablet (0.4 mg total) under the tongue every 5 (five) minutes as needed for chest pain.  . pantoprazole (PROTONIX) 40 MG tablet TAKE 1 TABLET (40 MG TOTAL) BY MOUTH DAILY.  Marland Kitchen tetrahydrozoline-zinc (VISINE-AC) 0.05-0.25 % ophthalmic solution Place 2 drops into both eyes 3 (three) times daily as needed (redness).     Allergies:   Patient has no known allergies.   Social History   Social History  . Marital status: Married    Spouse name: N/A  . Number of children: 1  . Years of education: N/A   Occupational History  . sales Patent examiner   Social History Main Topics  . Smoking status: Never Smoker  . Smokeless tobacco: Never Used  . Alcohol use 7.2 oz/week    12 Cans of beer per week  . Drug use: No  . Sexual activity: Not Asked   Other Topics Concern  . None   Social History Narrative  . None     Family History:  The patient's family history includes Aneurysm in his brother; Epilepsy (age of onset: 67) in his brother; Stroke in his brother.   ROS:   Please see the history of present illness.    ROS All other systems reviewed and are negative.  No flowsheet data found.     PHYSICAL EXAM:   VS:  BP (!) 150/88   Pulse 85   Ht 5\' 10"  (1.778 m)   Wt 193 lb 8 oz (87.8 kg)   SpO2 99%   BMI 27.76 kg/m    GEN: Well nourished, well developed, in no acute distress  HEENT: normal  Neck: no JVD, carotid bruits, or masses Cardiac: RRR; no murmurs, rubs, or gallops,no edema.  Intact distal pulses bilaterally.  Respiratory:  clear to auscultation bilaterally, normal work of breathing GI: soft, nontender, nondistended, + BS MS: no deformity or atrophy  Skin: warm and dry, no rash Neuro:  Alert and Oriented x 3, Strength and sensation are intact Psych: euthymic mood, full affect  Wt Readings from Last 3 Encounters:  07/24/16 193 lb 8 oz (87.8 kg)  07/03/16 193 lb 6.4 oz (87.7 kg)  01/12/16 190 lb (86.2 kg)       Studies/Labs Reviewed:   EKG:  EKG is not ordered today.    Recent Labs: 01/12/2016: ALT 24   Lipid Panel    Component Value Date/Time   CHOL 256 (H) 01/12/2016 1154   TRIG 64 01/12/2016 1154   HDL 67 01/12/2016 1154   CHOLHDL 3.8 01/12/2016 1154   VLDL 13 01/12/2016 1154   LDLCALC 176 (H) 01/12/2016 1154   LDLDIRECT 147.2 03/24/2013 1007    Additional studies/ records that  were reviewed today include:  CPAP download    ASSESSMENT:    1. Coronary artery disease involving native coronary artery of native heart with angina pectoris ([place])   2. Essential hypertension   3. Chronic diastolic CHF (congestive heart failure) ([place])   4. OSA (obstructive sleep apnea)   5. Hypercholesteremia      PLAN:  In order of problems listed above:  1. ASCAD s/p PCI of LAD with cath 2015 showing patent LAD stent with nonobstructive ASCAD with 40-50% RCA, 60% D1, 70% inferior branch of D1.  He has chronic stable angina on antianginal therapy but seems to be having more exertional symptoms of SOB and angina.  It is hard to determine how much of it is worrying about his heart with anxiety vs.true angina.  Therefore, I will get a stress myoview to rule out ischemia and 2D echo to assess worsening SOB.  He will continue on ASA/Plavix/BB and long acting nitrate. I will increase Imdur to '60mg'$  daily.   He is statin intolerant.   2. HTN - BP is adequately controlled on current meds. He will continue on amlodipine, Carvedilol.    3. Chronic diastolic CHF - he appears euvolemic on exam today but has had more DOE.  His weight is stable.  I will check a BNP to make sure he is not volume overloaded. He will continue on BB and diuretic.    4.   OSA - the patient is tolerating PAP therapy well without any problems. The PAP download was reviewed today and showed an AHI of 9.7/hr on 15 cm H2O with 17% compliance in using more than 4 hours nightly.  The patient has been using and benefiting from CPAP use and  will continue to benefit from therapy. He is going to try to be more compliant with his device.   5.   Hyperlipidemia - LDL goal is < 70.  I will get an FLP and ALT.  His last LDL in Oct 2017 was 176.  He is statin intolerant so I will refer him to lipid clinic to see if he qualifies for [facility] drug   Medication Adjustments/Labs and Tests Ordered: Current medicines are reviewed at length with the patient today.  Concerns regarding medicines are outlined above.  Medication changes, Labs and Tests ordered today are listed in the Patient Instructions below.  There are no Patient Instructions on file for this visit.   Signed, Fransico Him, MD  07/24/2016 11:41 AM    [facility] [address], [place], [place]  [address] Phone: [phone]; Fax: [phone]

## 2016-07-25 LAB — BASIC METABOLIC PANEL
BUN / CREAT RATIO: 15 (ref 10–24)
BUN: 15 mg/dL (ref 8–27)
CHLORIDE: 99 mmol/L (ref 96–106)
CO2: 26 mmol/L (ref 18–29)
Calcium: 10 mg/dL (ref 8.6–10.2)
Creatinine, Ser: 1 mg/dL (ref 0.76–1.27)
GFR, EST AFRICAN AMERICAN: 90 mL/min/{1.73_m2} (ref >59)
GFR, EST NON AFRICAN AMERICAN: 78 mL/min/{1.73_m2} (ref >59)
Glucose: 112 mg/dL — ABNORMAL HIGH (ref 65–99)
Potassium: 5.5 mmol/L — ABNORMAL HIGH (ref 3.5–5.2)
Sodium: 137 mmol/L (ref 134–144)

## 2016-07-25 LAB — HEPATIC FUNCTION PANEL
ALT: 26 IU/L (ref 0–44)
AST: 23 IU/L (ref 0–40)
Albumin: 4.8 g/dL (ref 3.6–4.8)
Alkaline Phosphatase: 97 IU/L (ref 39–117)
BILIRUBIN, DIRECT: 0.13 mg/dL (ref 0.00–0.40)
Bilirubin Total: 0.4 mg/dL (ref 0.0–1.2)
Total Protein: 7.5 g/dL (ref 6.0–8.5)

## 2016-07-25 LAB — LIPID PANEL
Chol/HDL Ratio: 4.3 ratio (ref 0.0–5.0)
Cholesterol, Total: 265 mg/dL — ABNORMAL HIGH (ref 100–199)
HDL: 61 mg/dL (ref >39)
LDL Calculated: 188 mg/dL — ABNORMAL HIGH (ref 0–99)
Triglycerides: 80 mg/dL (ref 0–149)
VLDL Cholesterol Cal: 16 mg/dL (ref 5–40)

## 2016-07-25 LAB — PRO B NATRIURETIC PEPTIDE: NT-PRO BNP: 38 pg/mL (ref 0–376)

## 2016-07-26 ENCOUNTER — Telehealth: Payer: Self-pay

## 2016-07-26 DIAGNOSIS — I5032 Chronic diastolic (congestive) heart failure: Secondary | ICD-10-CM

## 2016-07-26 NOTE — Telephone Encounter (Signed)
-----   Message from Sueanne Margarita, MD sent at 07/26/2016 11:24 AM EDT ----- Repeat BMET in 1 week

## 2016-07-26 NOTE — Telephone Encounter (Signed)
Patient is out of town and is coming back for testing 5/24. BMET scheduled 5/24.

## 2016-07-31 ENCOUNTER — Encounter: Payer: Self-pay | Admitting: Gastroenterology

## 2016-08-06 ENCOUNTER — Telehealth (HOSPITAL_COMMUNITY): Payer: Self-pay | Admitting: *Deleted

## 2016-08-06 NOTE — Telephone Encounter (Signed)
Patient given detailed instructions per Myocardial Perfusion Study Information Sheet for the test on 08/08/16 at 0945. Patient notified to arrive 15 minutes early and that it is imperative to arrive on time for appointment to keep from having the test rescheduled.  If you need to cancel or reschedule your appointment, please call the office within 24 hours of your appointment. . Patient verbalized understanding.Larry Arroyo, [place]e [place]

## 2016-08-08 ENCOUNTER — Other Ambulatory Visit: Payer: Medicare Other | Admitting: *Deleted

## 2016-08-08 ENCOUNTER — Ambulatory Visit (HOSPITAL_BASED_OUTPATIENT_CLINIC_OR_DEPARTMENT_OTHER): Payer: Medicare Other

## 2016-08-08 ENCOUNTER — Ambulatory Visit (HOSPITAL_COMMUNITY): Payer: Medicare Other | Attending: Cardiovascular Disease

## 2016-08-08 ENCOUNTER — Ambulatory Visit: Payer: Medicare Other

## 2016-08-08 ENCOUNTER — Other Ambulatory Visit: Payer: Self-pay

## 2016-08-08 DIAGNOSIS — E785 Hyperlipidemia, unspecified: Secondary | ICD-10-CM | POA: Diagnosis not present

## 2016-08-08 DIAGNOSIS — G4733 Obstructive sleep apnea (adult) (pediatric): Secondary | ICD-10-CM | POA: Insufficient documentation

## 2016-08-08 DIAGNOSIS — I5032 Chronic diastolic (congestive) heart failure: Secondary | ICD-10-CM

## 2016-08-08 DIAGNOSIS — I2510 Atherosclerotic heart disease of native coronary artery without angina pectoris: Secondary | ICD-10-CM | POA: Diagnosis not present

## 2016-08-08 DIAGNOSIS — R9439 Abnormal result of other cardiovascular function study: Secondary | ICD-10-CM | POA: Insufficient documentation

## 2016-08-08 DIAGNOSIS — I25119 Atherosclerotic heart disease of native coronary artery with unspecified angina pectoris: Secondary | ICD-10-CM

## 2016-08-08 DIAGNOSIS — R0602 Shortness of breath: Secondary | ICD-10-CM | POA: Diagnosis not present

## 2016-08-08 DIAGNOSIS — I110 Hypertensive heart disease with heart failure: Secondary | ICD-10-CM | POA: Diagnosis not present

## 2016-08-08 DIAGNOSIS — R079 Chest pain, unspecified: Secondary | ICD-10-CM | POA: Insufficient documentation

## 2016-08-08 LAB — BASIC METABOLIC PANEL
BUN / CREAT RATIO: 17 (ref 10–24)
BUN: 14 mg/dL (ref 8–27)
CHLORIDE: 105 mmol/L (ref 96–106)
CO2: 17 mmol/L — ABNORMAL LOW (ref 18–29)
Calcium: 8.9 mg/dL (ref 8.6–10.2)
Creatinine, Ser: 0.83 mg/dL (ref 0.76–1.27)
GFR calc Af Amer: 105 mL/min/{1.73_m2} (ref >59)
GFR calc non Af Amer: 91 mL/min/{1.73_m2} (ref >59)
GLUCOSE: 107 mg/dL — AB (ref 65–99)
Potassium: 4.3 mmol/L (ref 3.5–5.2)
SODIUM: 140 mmol/L (ref 134–144)

## 2016-08-08 LAB — MYOCARDIAL PERFUSION IMAGING
CHL CUP NUCLEAR SDS: 3
CHL CUP NUCLEAR SRS: 4
LV dias vol: 93 mL (ref 62–150)
LV sys vol: 37 mL
NUC STRESS TID: 1.14
Peak HR: 113 {beats}/min
RATE: 0.28
Rest HR: 102 {beats}/min
SSS: 7

## 2016-08-08 MED ORDER — REGADENOSON 0.4 MG/5ML IV SOLN
0.4000 mg | Freq: Once | INTRAVENOUS | Status: AC
  Administered 2016-08-08: 0.4 mg via INTRAVENOUS

## 2016-08-08 MED ORDER — TECHNETIUM TC 99M TETROFOSMIN IV KIT
33.0000 | PACK | Freq: Once | INTRAVENOUS | Status: AC | PRN
  Administered 2016-08-08: 33 via INTRAVENOUS
  Filled 2016-08-08: qty 33

## 2016-08-08 MED ORDER — TECHNETIUM TC 99M TETROFOSMIN IV KIT
10.5000 | PACK | Freq: Once | INTRAVENOUS | Status: AC | PRN
  Administered 2016-08-08: 10.5 via INTRAVENOUS
  Filled 2016-08-08: qty 11

## 2016-08-13 ENCOUNTER — Telehealth: Payer: Self-pay

## 2016-08-13 DIAGNOSIS — R0602 Shortness of breath: Secondary | ICD-10-CM

## 2016-08-13 NOTE — Telephone Encounter (Signed)
Informed patient of ECHO results and verbal understanding expressed.  Patient is currently out of town. He will call back to schedule BNP. He was grateful for call.

## 2016-08-13 NOTE — Telephone Encounter (Signed)
-----   Message from Sueanne Margarita, MD sent at 08/08/2016 11:01 AM EDT ----- Please have come in for BNP

## 2016-08-14 ENCOUNTER — Encounter: Payer: Self-pay | Admitting: Gastroenterology

## 2016-08-14 ENCOUNTER — Ambulatory Visit (AMBULATORY_SURGERY_CENTER): Payer: Medicare Other | Admitting: Gastroenterology

## 2016-08-14 VITALS — BP 113/83 | HR 93 | Temp 96.8°F | Resp 13 | Ht 70.0 in | Wt 193.0 lb

## 2016-08-14 DIAGNOSIS — D123 Benign neoplasm of transverse colon: Secondary | ICD-10-CM | POA: Diagnosis not present

## 2016-08-14 DIAGNOSIS — Z1211 Encounter for screening for malignant neoplasm of colon: Secondary | ICD-10-CM | POA: Diagnosis not present

## 2016-08-14 DIAGNOSIS — Z86010 Personal history of colonic polyps: Secondary | ICD-10-CM

## 2016-08-14 MED ORDER — ISOSORBIDE MONONITRATE ER 60 MG PO TB24: ORAL_TABLET | ORAL | 1 refills | Status: DC

## 2016-08-14 MED ORDER — SODIUM CHLORIDE 0.9 % IV SOLN
500.0000 mL | INTRAVENOUS | Status: AC
Start: 2016-08-14 — End: ?

## 2016-08-14 NOTE — Telephone Encounter (Signed)
Pt requested to come in 08/15/16 for proBNP, this has been done.

## 2016-08-14 NOTE — Op Note (Signed)
[place] Patient Name: Larry Arroyo Procedure Date: 08/14/2016 1:57 PM MRN: [id number] Endoscopist: Ladene Artist , MD Age: 66 Referring MD:  Date of Birth: 12-Sep-1950 Gender: Male Account #: [id number] Procedure:                Colonoscopy Indications:              Surveillance: Personal history of adenomatous                            polyps on last colonoscopy > 5 years ago Medicines:                Monitored Anesthesia Care Procedure:                Pre-Anesthesia Assessment:                           - Prior to the procedure, a History and Physical                            was performed, and patient medications and                            allergies were reviewed. The patient's tolerance of                            previous anesthesia was also reviewed. The risks                            and benefits of the procedure and the sedation                            options and risks were discussed with the patient.                            All questions were answered, and informed consent                            was obtained. Prior Anticoagulants: The patient has                            taken Plavix (clopidogrel), last dose was 5 days                            prior to procedure. ASA Grade Assessment: III - A                            patient with severe systemic disease. After                            reviewing the risks and benefits, the patient was                            deemed in satisfactory condition to undergo the  procedure.                           After obtaining informed consent, the colonoscope                            was passed under direct vision. Throughout the                            procedure, the patient's blood pressure, pulse, and                            oxygen saturations were monitored continuously. The                            Model PCF-[id number]) scope was introduced                             through the anus and advanced to the the cecum,                            identified by appendiceal orifice and ileocecal                            valve. The ileocecal valve, appendiceal orifice,                            and rectum were photographed. The quality of the                            bowel preparation was good. The colonoscopy was                            performed without difficulty. The patient tolerated                            the procedure well. Scope In: 2:08:17 PM Scope Out: 2:20:01 PM Scope Withdrawal Time: 0 hours 10 minutes 2 seconds  Total Procedure Duration: 0 hours 11 minutes 44 seconds  Findings:                 The perianal and digital rectal examinations were                            normal.                           A 6 mm polyp was found in the transverse colon. The                            polyp was sessile. The polyp was removed with a                            cold snare. Resection and retrieval were complete.  Multiple medium-mouthed diverticula were found in                            the left colon and in the right colon. There was                            narrowing of the colon in association with the                            diverticular opening. There was evidence of                            diverticular spasm. There was no evidence of                            diverticular bleeding.                           Internal hemorrhoids were found during                            retroflexion. The hemorrhoids were small and Grade                            I (internal hemorrhoids that do not prolapse).                           The exam was otherwise without abnormality on                            direct and retroflexion views. Complications:            No immediate complications. Estimated blood loss:                            None. Estimated Blood Loss:     Estimated blood loss:  none. Impression:               - One 6 mm polyp in the transverse colon, removed                            with a cold snare. Resected and retrieved.                           - Moderate diverticulosis in the right colon and in                            the left colon.                           - Internal hemorrhoids.                           - The examination was otherwise normal on direct  and retroflexion views. Recommendation:           - Repeat colonoscopy in 5 years for surveillance.                           - Resume Plavix (clopidogrel) tomorrow at prior                            dose. Refer to managing physician for further                            adjustment of therapy.                           - Patient has a contact number available for                            emergencies. The signs and symptoms of potential                            delayed complications were discussed with the                            patient. Return to normal activities tomorrow.                            Written discharge instructions were provided to the                            patient.                           - High fiber diet.                           - Continue present medications.                           - Await pathology results. Ladene Artist, MD 08/14/2016 2:24:19 PM This report has been signed electronically.

## 2016-08-14 NOTE — Telephone Encounter (Signed)
Notes recorded by Sueanne Margarita, MD on 08/13/2016 at 9:05 PM EDT Patient had moderate nonobstructive disease of the RCA in 2015 - now with some mild ischemia in the inferior wall. Increase Imdur to 90mg  daily and followup with extender in 2 weeks to see if SOB improves  Done 08/14/16--see myoview results 08/08/16.

## 2016-08-14 NOTE — Patient Instructions (Signed)
YOU HAD AN ENDOSCOPIC PROCEDURE TODAY AT [facility]:   Refer to the procedure report that was given to you for any specific questions about what was found during the examination.  If the procedure report does not answer your questions, please call your gastroenterologist to clarify.  If you requested that your care partner not be given the details of your procedure findings, then the procedure report has been included in a sealed envelope for you to review at your convenience later.  YOU SHOULD EXPECT: Some feelings of bloating in the abdomen. Passage of more gas than usual.  Walking can help get rid of the air that was put into your GI tract during the procedure and reduce the bloating. If you had a lower endoscopy (such as a colonoscopy or flexible sigmoidoscopy) you may notice spotting of blood in your stool or on the toilet paper. If you underwent a bowel prep for your procedure, you may not have a normal bowel movement for a few days.  Please Note:  You might notice some irritation and congestion in your nose or some drainage.  This is from the oxygen used during your procedure.  There is no need for concern and it should clear up in a day or so.  SYMPTOMS TO REPORT IMMEDIATELY:   Following lower endoscopy (colonoscopy or flexible sigmoidoscopy):  Excessive amounts of blood in the stool  Significant tenderness or worsening of abdominal pains  Swelling of the abdomen that is new, acute  Fever of 100F or higher   For urgent or emergent issues, a gastroenterologist can be reached at any hour by calling [phone].   DIET:  We do recommend a small meal at first, but then you may proceed to your regular diet.  Drink plenty of fluids but you should avoid alcoholic beverages for 24 hours. Try to eat a high fiber diet, and drink plenty of water.  ACTIVITY:  You should plan to take it easy for the rest of today and you should NOT DRIVE or use heavy machinery until tomorrow  (because of the sedation medicines used during the test).    FOLLOW UP: Our staff will call the number listed on your records the next business day following your procedure to check on you and address any questions or concerns that you may have regarding the information given to you following your procedure. If we do not reach you, we will leave a message.  However, if you are feeling well and you are not experiencing any problems, there is no need to return our call.  We will assume that you have returned to your regular daily activities without incident.  If any biopsies were taken you will be contacted by phone or by letter within the next 1-3 weeks.  Please call us at [phone] if you have not heard about the biopsies in 3 weeks.    SIGNATURES/CONFIDENTIALITY: You and/or your care partner have signed paperwork which will be entered into your electronic medical record.  These signatures attest to the fact that that the information above on your After Visit Summary has been reviewed and is understood.  Full responsibility of the confidentiality of this discharge information lies with you and/or your care-partner.  You may resume your plavix tomorrow per Dr. Fuller Plan. You will need another colonoscopy in 5 years.

## 2016-08-14 NOTE — Progress Notes (Signed)
A/ox3 pleased with MAC, report to Suzanne RN 

## 2016-08-14 NOTE — Progress Notes (Signed)
Called to room to assist during endoscopic procedure.  Patient ID and intended procedure confirmed with present staff. Received instructions for my participation in the procedure from the performing physician.  

## 2016-08-14 NOTE — Progress Notes (Signed)
Pt's states no medical or surgical changes since previsit or office visit. 

## 2016-08-15 ENCOUNTER — Other Ambulatory Visit: Payer: Medicare Other | Admitting: *Deleted

## 2016-08-15 ENCOUNTER — Telehealth: Payer: Self-pay

## 2016-08-15 DIAGNOSIS — R0602 Shortness of breath: Secondary | ICD-10-CM

## 2016-08-15 NOTE — Telephone Encounter (Signed)
  Follow up Call-  Call back number 08/14/2016  Post procedure Call Back phone  # [phone]  Permission to leave phone message Yes  Some recent data might be hidden     Patient questions:  Do you have a fever, pain , or abdominal swelling? No. Pain Score  0 *  Have you tolerated food without any problems? Yes.    Have you been able to return to your normal activities? Yes.    Do you have any questions about your discharge instructions: Diet   No. Medications  No. Follow up visit  No.  Do you have questions or concerns about your Care? No.  Actions: * If pain score is 4 or above: No action needed, pain <4.

## 2016-08-16 LAB — PRO B NATRIURETIC PEPTIDE: NT-PRO BNP: 56 pg/mL (ref 0–376)

## 2016-08-20 ENCOUNTER — Encounter: Payer: Self-pay | Admitting: Physician Assistant

## 2016-08-27 ENCOUNTER — Encounter: Payer: Self-pay | Admitting: Physician Assistant

## 2016-08-27 NOTE — Progress Notes (Signed)
Cardiology Office Note    Date:  08/28/2016  ID:  Larry Arroyo, DOB 09/26/1950, MRN [id number] PCP:  [name], MD  Cardiologist:  Radford Pax   Chief Complaint: f/u dyspnea  History of Present Illness:  Larry Arroyo is a 66 y.o. male with history of CAD (s/p prior stenting in LAD 03/2013) with chronic stable angina, depression, diastolic dysfunction, HTN, HL (statin intolerance), OSA who presents for f/u of shortness of breath. Last LHC in 11/2013 showed patent LAD stents, 80% prox D1, mild prox D2 disease, 50-60% OM1 (neg FFR), tortuous RCA with mild-moderate mid vessel disease. He recently noticed increased dyspnea with normal activities. 2D echo 08/08/16 showed moderate LVH, EF 60-65%, grade 1 DD. Nuclear stress test 08/08/16 showed inferior thinning and mild inferior ischemia, EF 60% with normal wall motion. Dr. Radford Pax recomended to increase Imdur and plan follow-up. Most recent labs showed normal BNP, K 4.3, Cr 0.83, LDL 188, LFTS wnl (07/2016). Last CBC was in 2017 and normal.  He presents back to clinic today feeling about the same as before. He has noticed progressive SOB over the last year. No significant change with Imdur. He has been having to break it up because it makes him feel wiped out. He also notices a general intolerance of medications - feels poorly for 3-4 hours after taking them in the morning. He does notice that SL NTG helps at times. He only sleeps 3-5 hours per night but states this has been the case "forever" even with sleep hygiene measures. He finds compliance with CPAP difficult. He has daytime fatigue. He also reports stress at home with his wife who has Parkinson's and caring for her demented mother. He has chronic hip issues as well which limits his activity.   Past Medical History:  Diagnosis Date  . Anxiety   . Arthritis   . Chronic stable angina ([place])   . Claudication ([place]) 07/20/2015  . Colon polyps 02/05/2010  . Complication of anesthesia    16 yrs ago  following hip replacement states took a long time for full memory to return  . Coronary artery disease    a. s/p prior stenting in LAD 03/2013. b. Last LHC in 11/2013 showed patent LAD stents, 80% prox D1, mild prox D2 disease, 50-60% OM1 (neg FFR), tortuous RCA with mild-moderate mid vessel disease. He recently noticed increased dyspnea with normal activities. 2D echo 08/08/16 showed moderate LVH, EF 60-65%, grade 1 DD. Nuclear stress test 08/08/16 showed inferior thinning and mild inferior ischemia, EF   . Depression   . Hypercholesteremia   . Hypertension   . Myocardial infarction ([place])   . OSA (obstructive sleep apnea) 01/12/2016   Moderate with AHI 19/hr now on CPAP at 15cm H2O    Past Surgical History:  Procedure Laterality Date  . CARDIAC CATHETERIZATION     nonobstructive ASCAD of the RCA of 40-50%, 60% D1, and 70% inferior branch of D1  . CORONARY ANGIOPLASTY WITH STENT PLACEMENT  01/272015   FFR and 3.0 x 38 mm Alpine DES to the LAD  . LEFT HEART CATHETERIZATION WITH CORONARY ANGIOGRAM N/A 04/13/2013   Procedure: LEFT HEART CATHETERIZATION WITH CORONARY ANGIOGRAM;  Surgeon: Sueanne Margarita, MD;  Location: [facility];  Service: Cardiovascular;  Laterality: N/A;  . LEFT HEART CATHETERIZATION WITH CORONARY ANGIOGRAM N/A 05/12/2013   Procedure: LEFT HEART CATHETERIZATION WITH CORONARY ANGIOGRAM;  Surgeon: Burnell Blanks, MD;  Location: [facility] C[facility];  Service: Cardiovascular;  Laterality: N/A;  .  LEFT HEART CATHETERIZATION WITH CORONARY ANGIOGRAM N/A 12/03/2013   Procedure: LEFT HEART CATHETERIZATION WITH CORONARY ANGIOGRAM;  Surgeon: Jettie Booze, MD;  Location: [facility] CATH LAB;  Service: Cardiovascular;  Laterality: N/A;  . TOTAL HIP ARTHROPLASTY  02/11/2011   Procedure: TOTAL HIP ARTHROPLASTY;  Surgeon: Dione Plover Aluisio;  Location: [facility];  Service: Orthopedics;  Laterality: Right;  . TOTAL HIP ARTHROPLASTY Left     Current Medications: Current Outpatient Prescriptions    Medication Sig Dispense Refill  . amLODipine (NORVASC) 5 MG tablet Take 1 tablet (5 mg total) by mouth daily. 30 tablet 11  . aspirin EC 81 MG tablet Take 1 tablet (81 mg total) by mouth daily.    . carvedilol (COREG) 12.5 MG tablet Take 1 tablet (12.5 mg total) by mouth 2 (two) times daily with a meal. 60 tablet 11  . clopidogrel (PLAVIX) 75 MG tablet TAKE 1 TABLET (75 MG TOTAL) BY MOUTH DAILY. 30 tablet 11  . DULoxetine (CYMBALTA) 60 MG capsule Take 60 mg by mouth daily.  5  . isosorbide mononitrate (IMDUR) 60 MG 24 hr tablet Take 1 and 1/2 tablets ( 90 mg) by mouth daily 135 tablet 1  . nitroGLYCERIN (NITROSTAT) 0.4 MG SL tablet Place 1 tablet (0.4 mg total) under the tongue every 5 (five) minutes as needed for chest pain. 25 tablet 3  . pantoprazole (PROTONIX) 40 MG tablet TAKE 1 TABLET (40 MG TOTAL) BY MOUTH DAILY. 30 tablet 7  . tetrahydrozoline-zinc (VISINE-AC) 0.05-0.25 % ophthalmic solution Place 2 drops into both eyes 3 (three) times daily as needed (redness).      Current Facility-Administered Medications  Medication Dose Route Frequency Provider Last Rate Last Dose  . 0.9 %  sodium chloride infusion  500 mL Intravenous Continuous Ladene Artist, MD         Allergies:   Patient has no known allergies.   Social History   Social History  . Marital status: Married    Spouse name: N/A  . Number of children: 1  . Years of education: N/A   Occupational History  . sales Electrical engineer   Social History Main Topics  . Smoking status: Never Smoker  . Smokeless tobacco: Never Used  . Alcohol use 7.2 oz/week    12 Cans of beer per week  . Drug use: No  . Sexual activity: Not Asked   Other Topics Concern  . None   Social History Narrative  . None     Family History:  Family History  Problem Relation Age of Onset  . Epilepsy Brother 78  . Stroke Brother   . Aneurysm Brother        brain  . Heart attack Neg Hx   . Colon cancer Neg Hx   . Esophageal cancer Neg Hx   .  Stomach cancer Neg Hx   . Pancreatic cancer Neg Hx   . Colon polyps Neg Hx     ROS:   Please see the history of present illness. All other systems are reviewed and otherwise negative.    PHYSICAL EXAM:   VS:  BP (!) 162/100   Pulse 79   Ht 5\' 10"  (1.778 m)   Wt 192 lb 12.8 oz (87.5 kg)   BMI 27.66 kg/m   BMI: Body mass index is 27.66 kg/m. GEN: Well nourished, well developed WM, in no acute distress  HEENT: normocephalic, atraumatic Neck: no JVD, carotid bruits, or masses Cardiac:RRR; no murmurs, rubs, or gallops, no edema  Respiratory:  clear to auscultation bilaterally, normal work of breathing GI: soft, nontender, nondistended, + BS MS: no deformity or atrophy  Skin: warm and dry, no rash Neuro:  Alert and Oriented x 3, Strength and sensation are intact, follows commands Psych: euthymic mood, full affect  Wt Readings from Last 3 Encounters:  08/28/16 192 lb 12.8 oz (87.5 kg)  08/14/16 193 lb (87.5 kg)  08/08/16 193 lb (87.5 kg)      Studies/Labs Reviewed:   EKG:  EKG was ordered today and personally reviewed by me and demonstrates NSR 79bpm, no acute ST-T changes.  Recent Labs: 07/24/2016: ALT 26 08/08/2016: BUN 14; Creatinine, Ser 0.83; Potassium 4.3; Sodium 140 08/15/2016: NT-Pro BNP 56   Lipid Panel    Component Value Date/Time   CHOL 265 (H) 07/24/2016 1250   TRIG 80 07/24/2016 1250   HDL 61 07/24/2016 1250   CHOLHDL 4.3 07/24/2016 1250   CHOLHDL 3.8 01/12/2016 1154   VLDL 13 01/12/2016 1154   LDLCALC 188 (H) 07/24/2016 1250   LDLDIRECT 147.2 03/24/2013 1007    Additional studies/ records that were reviewed today include: Summarized above.    ASSESSMENT & PLAN:   1. Shortness of breath - recent abnormal stress test suggestive of inferior ischemia. He continues to have symptoms despite maximizing medical regimen. He already feels he does not tolerate his present regimen well due to fatigue. I suspect this is likely multifactorial also in the setting  of suboptimally controlled OSA. Last cath 2015 had OM as well as RCA disease, question progression given uncontrolled hyperlipidemia. Discussed with Dr. Radford Pax. At this time we feel cath would be beneficial to further evaluate his anatomy. Risks and benefits of cardiac catheterization have been discussed with the patient. These include bleeding, infection, kidney damage, stroke, heart attack, death. The patient understands these risks and is willing to proceed. Will check pre-cath labs today. He does not wish to proceed until early next week. Warning sx/ER precautions reviewed. Will refill SL NTG as requested. 2. CAD - see above. Continue present regimen. 3. Essential HTN - BP is running high today but he states he did not take anything before coming to today's visit. He states home BP runs 130s/80s. Ideally would further titrate medications to a goal of <120/80 but he seems generally intolerant to even the regimen he is taking at present time. Suspect incomplete CPAP compliance is also contributing. He says he had some sinus issues the last few weeks which were preventing him from using it but plans to get back on board. Would follow up BP post-cath to determine if further adjustment is warranted. 4. Hyperlipidemia - he states he did have a conversation with lipid clinic about this. He is unwilling to pay for a PCSK9 at this time. He was offered enrolling in a trial but states he plans on moving in a year or so and does not wish to do so. He has not tried Zetia before, but is willing to do so. Will check LFTs with labs today and initiate Zetia 10mg  daily. If tolerating by the time of next visit will need repeat labs about 4-6 weeks thereafter.  Disposition: F/u with Dr. Betsy Pries team APP 1 week after cath.   Medication Adjustments/Labs and Tests Ordered: Current medicines are reviewed at length with the patient today.  Concerns regarding medicines are outlined above. Medication changes, Labs and Tests  ordered today are summarized above and listed in the Patient Instructions accessible in Encounters.   Signed, Nedra Hai  Idolina Primer, PA-C  08/28/2016 10:16 AM    [facility] [address] [place], [place], [place]  [address] Phone: [phone]; Fax: [phone]

## 2016-08-28 ENCOUNTER — Encounter: Payer: Self-pay | Admitting: Physician Assistant

## 2016-08-28 ENCOUNTER — Other Ambulatory Visit: Payer: Self-pay | Admitting: Physician Assistant

## 2016-08-28 ENCOUNTER — Ambulatory Visit (INDEPENDENT_AMBULATORY_CARE_PROVIDER_SITE_OTHER): Payer: Medicare Other | Admitting: Physician Assistant

## 2016-08-28 VITALS — BP 162/100 | HR 79 | Ht 70.0 in | Wt 192.8 lb

## 2016-08-28 DIAGNOSIS — E785 Hyperlipidemia, unspecified: Secondary | ICD-10-CM

## 2016-08-28 DIAGNOSIS — I10 Essential (primary) hypertension: Secondary | ICD-10-CM

## 2016-08-28 DIAGNOSIS — I2510 Atherosclerotic heart disease of native coronary artery without angina pectoris: Secondary | ICD-10-CM | POA: Diagnosis not present

## 2016-08-28 DIAGNOSIS — R0602 Shortness of breath: Secondary | ICD-10-CM | POA: Diagnosis not present

## 2016-08-28 MED ORDER — EZETIMIBE 10 MG PO TABS: 10.0000 mg | ORAL_TABLET | Freq: Every day | ORAL | 3 refills | Status: DC

## 2016-08-28 MED ORDER — NITROGLYCERIN 0.4 MG SL SUBL: 0.4000 mg | SUBLINGUAL_TABLET | SUBLINGUAL | 3 refills | Status: DC | PRN

## 2016-08-28 NOTE — Addendum Note (Signed)
Addended by: Hosie Poisson R on: 08/28/2016 11:42 AM   Modules accepted: Orders

## 2016-08-28 NOTE — Patient Instructions (Signed)
Medication Instructions:  Your physician has recommended you make the following change in your medication: 1.) START Zetia 10 mg daily  Labwork: Your physician recommends that you return for lab work today for CMET, BMET, CBC, PT/INR  Testing/Procedures:   [facility] [address] [place] [address] Dept: [phone] Loc: [phone]  GARETH FITZNER  08/28/2016  You are scheduled for a left and right heart cath on 09/03/16 at [facility] by Dr. Claiborne Billings.   1. Please arrive at the [facility] (Main Entrance A) at [facility]: [address] [place], [place] [address] at 5:30 (two hours before your procedure to ensure your preparation). Free valet parking service is available.   Special note: Every effort is made to have your procedure done on time. Please understand that emergencies sometimes delay scheduled procedures.  2. Diet: nothing to eat or drink after midnight the night before  3. Labs: you will have blood drawn today 08/28/16  5. Plan for one night stay--bring personal belongings. 6. Bring a current list of your medications and current insurance cards. 7. You MUST have a responsible person to drive you home. 8. Someone MUST be with you the first 24 hours after you arrive home or your discharge will be delayed. 9. Please wear clothes that are easy to get on and off and wear slip-on shoes.  Thank you for allowing [place] to care for you!   -- [facility]         If you need a refill on your cardiac medications before your next appointment, please call your pharmacy.  Thank you for choosing [facility]

## 2016-08-29 ENCOUNTER — Encounter: Payer: Self-pay | Admitting: Gastroenterology

## 2016-08-29 LAB — CBC
Hematocrit: 44.2 % (ref 37.5–51.0)
Hemoglobin: 14.9 g/dL (ref 13.0–17.7)
MCH: 29.7 pg (ref 26.6–33.0)
MCHC: 33.7 g/dL (ref 31.5–35.7)
MCV: 88 fL (ref 79–97)
Platelets: 234 10*3/uL (ref 150–379)
RBC: 5.02 x10E6/uL (ref 4.14–5.80)
RDW: 14.7 % (ref 12.3–15.4)
WBC: 5.7 10*3/uL (ref 3.4–10.8)

## 2016-08-29 LAB — COMPREHENSIVE METABOLIC PANEL
ALT: 24 IU/L (ref 0–44)
AST: 23 IU/L (ref 0–40)
Albumin/Globulin Ratio: 1.7 (ref 1.2–2.2)
Albumin: 4.7 g/dL (ref 3.6–4.8)
Alkaline Phosphatase: 91 IU/L (ref 39–117)
BILIRUBIN TOTAL: 0.3 mg/dL (ref 0.0–1.2)
BUN/Creatinine Ratio: 20 (ref 10–24)
BUN: 20 mg/dL (ref 8–27)
CHLORIDE: 98 mmol/L (ref 96–106)
CO2: 22 mmol/L (ref 20–29)
CREATININE: 1.02 mg/dL (ref 0.76–1.27)
Calcium: 10.3 mg/dL — ABNORMAL HIGH (ref 8.6–10.2)
GFR calc Af Amer: 88 mL/min/{1.73_m2} (ref >59)
GFR calc non Af Amer: 76 mL/min/{1.73_m2} (ref >59)
GLUCOSE: 110 mg/dL — AB (ref 65–99)
Globulin, Total: 2.7 g/dL (ref 1.5–4.5)
Potassium: 5.3 mmol/L — ABNORMAL HIGH (ref 3.5–5.2)
SODIUM: 137 mmol/L (ref 134–144)
Total Protein: 7.4 g/dL (ref 6.0–8.5)

## 2016-08-29 LAB — PROTIME-INR
INR: 0.9 (ref 0.8–1.2)
PROTHROMBIN TIME: 10 s (ref 9.1–12.0)

## 2016-09-02 ENCOUNTER — Telehealth: Payer: Self-pay

## 2016-09-02 NOTE — Telephone Encounter (Signed)
Patient contacted pre-catheterization at [facility] scheduled for: 09/03/2016 @ 0730 Verified arrival time and place: NT @ 0530 Confirmed AM meds to be taken pre-cath with sip of water:  Pt states he is taking his ASA and plavix before procedure Confirmed patient has responsible person to drive home post procedure and observe patient for 24 hours: yes Addl concerns: none noted

## 2016-09-03 ENCOUNTER — Encounter (HOSPITAL_COMMUNITY)
Admission: RE | Disposition: A | Discharge status: Home / Self Care | Payer: Self-pay | Source: Ambulatory Visit | Attending: Cardiovascular Disease

## 2016-09-03 ENCOUNTER — Telehealth: Payer: Self-pay | Admitting: Physician Assistant

## 2016-09-03 ENCOUNTER — Ambulatory Visit (HOSPITAL_COMMUNITY)
Admission: RE | Admit: 2016-09-03 | Discharge: 2016-09-03 | Disposition: A | Discharge status: Home / Self Care | Payer: Medicare Other | Source: Ambulatory Visit | Attending: Cardiovascular Disease | Admitting: Cardiovascular Disease

## 2016-09-03 DIAGNOSIS — G4733 Obstructive sleep apnea (adult) (pediatric): Secondary | ICD-10-CM | POA: Insufficient documentation

## 2016-09-03 DIAGNOSIS — I10 Essential (primary) hypertension: Secondary | ICD-10-CM | POA: Insufficient documentation

## 2016-09-03 DIAGNOSIS — R9439 Abnormal result of other cardiovascular function study: Secondary | ICD-10-CM

## 2016-09-03 DIAGNOSIS — R0609 Other forms of dyspnea: Secondary | ICD-10-CM

## 2016-09-03 DIAGNOSIS — I252 Old myocardial infarction: Secondary | ICD-10-CM | POA: Insufficient documentation

## 2016-09-03 DIAGNOSIS — F419 Anxiety disorder, unspecified: Secondary | ICD-10-CM | POA: Insufficient documentation

## 2016-09-03 DIAGNOSIS — I2510 Atherosclerotic heart disease of native coronary artery without angina pectoris: Secondary | ICD-10-CM | POA: Diagnosis not present

## 2016-09-03 DIAGNOSIS — F329 Major depressive disorder, single episode, unspecified: Secondary | ICD-10-CM | POA: Diagnosis not present

## 2016-09-03 DIAGNOSIS — Z7982 Long term (current) use of aspirin: Secondary | ICD-10-CM | POA: Insufficient documentation

## 2016-09-03 DIAGNOSIS — Z955 Presence of coronary angioplasty implant and graft: Secondary | ICD-10-CM | POA: Insufficient documentation

## 2016-09-03 DIAGNOSIS — M1990 Unspecified osteoarthritis, unspecified site: Secondary | ICD-10-CM | POA: Diagnosis not present

## 2016-09-03 DIAGNOSIS — R0600 Dyspnea, unspecified: Secondary | ICD-10-CM

## 2016-09-03 DIAGNOSIS — Z7902 Long term (current) use of antithrombotics/antiplatelets: Secondary | ICD-10-CM | POA: Insufficient documentation

## 2016-09-03 DIAGNOSIS — E7800 Pure hypercholesterolemia, unspecified: Secondary | ICD-10-CM | POA: Diagnosis not present

## 2016-09-03 HISTORY — PX: LEFT HEART CATH AND CORONARY ANGIOGRAPHY: CATH118249

## 2016-09-03 SURGERY — LEFT HEART CATH AND CORONARY ANGIOGRAPHY
Anesthesia: LOCAL

## 2016-09-03 MED ORDER — ASPIRIN 81 MG PO CHEW: 81.0000 mg | CHEWABLE_TABLET | Freq: Every day | ORAL | Status: DC

## 2016-09-03 MED ORDER — VERAPAMIL HCL 2.5 MG/ML IV SOLN
INTRAVENOUS | Status: AC
  Filled 2016-09-03: qty 2

## 2016-09-03 MED ORDER — SODIUM CHLORIDE 0.9 % WEIGHT BASED INFUSION: 1.0000 mL/kg/h | INTRAVENOUS | Status: DC

## 2016-09-03 MED ORDER — SODIUM CHLORIDE 0.9% FLUSH: 3.0000 mL | Freq: Two times a day (BID) | INTRAVENOUS | Status: DC

## 2016-09-03 MED ORDER — HEPARIN (PORCINE) IN NACL 2-0.9 UNIT/ML-% IJ SOLN
INTRAMUSCULAR | Status: DC | PRN
  Administered 2016-09-03: 10 mL via INTRA_ARTERIAL

## 2016-09-03 MED ORDER — NITROGLYCERIN 1 MG/10 ML FOR IR/CATH LAB
INTRA_ARTERIAL | Status: AC
  Filled 2016-09-03: qty 10

## 2016-09-03 MED ORDER — LIDOCAINE HCL (PF) 1 % IJ SOLN
INTRAMUSCULAR | Status: DC | PRN
  Administered 2016-09-03: 2 mL via INTRADERMAL

## 2016-09-03 MED ORDER — IOPAMIDOL (ISOVUE-370) INJECTION 76%
INTRAVENOUS | Status: AC
  Filled 2016-09-03: qty 100

## 2016-09-03 MED ORDER — SODIUM CHLORIDE 0.9 % IV SOLN: INTRAVENOUS | Status: DC

## 2016-09-03 MED ORDER — SODIUM CHLORIDE 0.9% FLUSH: 3.0000 mL | INTRAVENOUS | Status: DC | PRN

## 2016-09-03 MED ORDER — FENTANYL CITRATE (PF) 100 MCG/2ML IJ SOLN
INTRAMUSCULAR | Status: DC | PRN
  Administered 2016-09-03: 50 ug via INTRAVENOUS

## 2016-09-03 MED ORDER — HEPARIN (PORCINE) IN NACL 2-0.9 UNIT/ML-% IJ SOLN
INTRAMUSCULAR | Status: AC
  Filled 2016-09-03: qty 1000

## 2016-09-03 MED ORDER — SODIUM CHLORIDE 0.9 % WEIGHT BASED INFUSION
3.0000 mL/kg/h | INTRAVENOUS | Status: DC
  Administered 2016-09-03: 3 mL/kg/h via INTRAVENOUS

## 2016-09-03 MED ORDER — IOPAMIDOL (ISOVUE-370) INJECTION 76%
INTRAVENOUS | Status: DC | PRN
  Administered 2016-09-03: 90 mL via INTRA_ARTERIAL

## 2016-09-03 MED ORDER — LIDOCAINE HCL (PF) 1 % IJ SOLN
INTRAMUSCULAR | Status: AC
  Filled 2016-09-03: qty 30

## 2016-09-03 MED ORDER — DIAZEPAM 5 MG PO TABS: 5.0000 mg | ORAL_TABLET | Freq: Four times a day (QID) | ORAL | Status: DC | PRN

## 2016-09-03 MED ORDER — HEPARIN SODIUM (PORCINE) 1000 UNIT/ML IJ SOLN
INTRAMUSCULAR | Status: DC | PRN
  Administered 2016-09-03: 4500 [IU] via INTRAVENOUS

## 2016-09-03 MED ORDER — FENTANYL CITRATE (PF) 100 MCG/2ML IJ SOLN
INTRAMUSCULAR | Status: AC
  Filled 2016-09-03: qty 2

## 2016-09-03 MED ORDER — MIDAZOLAM HCL 2 MG/2ML IJ SOLN
INTRAMUSCULAR | Status: AC
  Filled 2016-09-03: qty 2

## 2016-09-03 MED ORDER — SODIUM CHLORIDE 0.9% FLUSH
3.0000 mL | Freq: Two times a day (BID) | INTRAVENOUS | Status: DC
Start: 2016-09-03 — End: 2016-09-03

## 2016-09-03 MED ORDER — HEPARIN (PORCINE) IN NACL 2-0.9 UNIT/ML-% IJ SOLN
INTRAMUSCULAR | Status: AC | PRN
  Administered 2016-09-03: 1000 mL

## 2016-09-03 MED ORDER — SODIUM CHLORIDE 0.9 % IV SOLN: 250.0000 mL | INTRAVENOUS | Status: DC | PRN

## 2016-09-03 MED ORDER — MIDAZOLAM HCL 2 MG/2ML IJ SOLN
INTRAMUSCULAR | Status: DC | PRN
Start: 2016-09-03 — End: 2016-09-03
  Administered 2016-09-03: 2 mg via INTRAVENOUS
  Administered 2016-09-03: 1 mg via INTRAVENOUS

## 2016-09-03 MED ORDER — ONDANSETRON HCL 4 MG/2ML IJ SOLN: 4.0000 mg | Freq: Four times a day (QID) | INTRAMUSCULAR | Status: DC | PRN

## 2016-09-03 MED ORDER — ASPIRIN 81 MG PO CHEW
81.0000 mg | CHEWABLE_TABLET | ORAL | Status: AC
  Administered 2016-09-03: 81 mg via ORAL

## 2016-09-03 MED ORDER — HEPARIN SODIUM (PORCINE) 1000 UNIT/ML IJ SOLN
INTRAMUSCULAR | Status: AC
  Filled 2016-09-03: qty 1

## 2016-09-03 MED ORDER — ACETAMINOPHEN 325 MG PO TABS: 650.0000 mg | ORAL_TABLET | ORAL | Status: DC | PRN

## 2016-09-03 MED ORDER — ASPIRIN 81 MG PO CHEW
CHEWABLE_TABLET | ORAL | Status: AC
  Administered 2016-09-03: 81 mg via ORAL
  Filled 2016-09-03: qty 1

## 2016-09-03 SURGICAL SUPPLY — 13 items
CATH EXPO 5F FL3.5 (CATHETERS) ×2 IMPLANT
CATH INFINITI 5FR ANG PIGTAIL (CATHETERS) ×2 IMPLANT
CATH OPTITORQUE TIG 4.0 5F (CATHETERS) ×2 IMPLANT
DEVICE RAD COMP TR BAND LRG (VASCULAR PRODUCTS) ×2 IMPLANT
GLIDESHEATH SLEND SS 6F .021 (SHEATH) ×2 IMPLANT
GUIDEWIRE INQWIRE 1.5J.035X260 (WIRE) ×1 IMPLANT
INQWIRE 1.5J .035X260CM (WIRE) ×2
KIT HEART LEFT (KITS) ×2 IMPLANT
PACK CARDIAC CATHETERIZATION (CUSTOM PROCEDURE TRAY) ×2 IMPLANT
SYR MEDRAD MARK V 150ML (SYRINGE) ×2 IMPLANT
TRANSDUCER W/STOPCOCK (MISCELLANEOUS) ×2 IMPLANT
TUBING CIL FLEX 10 FLL-RA (TUBING) ×2 IMPLANT
WIRE HI TORQ VERSACORE-J 145CM (WIRE) ×2 IMPLANT

## 2016-09-03 NOTE — H&P (View-Only) (Signed)
Cardiology Office Note    Date:  08/28/2016  ID:  Larry Arroyo, DOB 05-11-1950, MRN [id number] PCP:  [name], MD  Cardiologist:  Radford Pax   Chief Complaint: f/u dyspnea  History of Present Illness:  Larry Arroyo is a 66 y.o. male with history of CAD (s/p prior stenting in LAD 03/2013) with chronic stable angina, depression, diastolic dysfunction, HTN, HL (statin intolerance), OSA who presents for f/u of shortness of breath. Last LHC in 11/2013 showed patent LAD stents, 80% prox D1, mild prox D2 disease, 50-60% OM1 (neg FFR), tortuous RCA with mild-moderate mid vessel disease. He recently noticed increased dyspnea with normal activities. 2D echo 08/08/16 showed moderate LVH, EF 60-65%, grade 1 DD. Nuclear stress test 08/08/16 showed inferior thinning and mild inferior ischemia, EF 60% with normal wall motion. Dr. Radford Pax recomended to increase Imdur and plan follow-up. Most recent labs showed normal BNP, K 4.3, Cr 0.83, LDL 188, LFTS wnl (07/2016). Last CBC was in 2017 and normal.  He presents back to clinic today feeling about the same as before. He has noticed progressive SOB over the last year. No significant change with Imdur. He has been having to break it up because it makes him feel wiped out. He also notices a general intolerance of medications - feels poorly for 3-4 hours after taking them in the morning. He does notice that SL NTG helps at times. He only sleeps 3-5 hours per night but states this has been the case "forever" even with sleep hygiene measures. He finds compliance with CPAP difficult. He has daytime fatigue. He also reports stress at home with his wife who has Parkinson's and caring for her demented mother. He has chronic hip issues as well which limits his activity.   Past Medical History:  Diagnosis Date  . Anxiety   . Arthritis   . Chronic stable angina ([place])   . Claudication ([place]) 07/20/2015  . Colon polyps 02/05/2010  . Complication of anesthesia    16 yrs ago  following hip replacement states took a long time for full memory to return  . Coronary artery disease    a. s/p prior stenting in LAD 03/2013. b. Last LHC in 11/2013 showed patent LAD stents, 80% prox D1, mild prox D2 disease, 50-60% OM1 (neg FFR), tortuous RCA with mild-moderate mid vessel disease. He recently noticed increased dyspnea with normal activities. 2D echo 08/08/16 showed moderate LVH, EF 60-65%, grade 1 DD. Nuclear stress test 08/08/16 showed inferior thinning and mild inferior ischemia, EF   . Depression   . Hypercholesteremia   . Hypertension   . Myocardial infarction ([place])   . OSA (obstructive sleep apnea) 01/12/2016   Moderate with AHI 19/hr now on CPAP at 15cm H2O    Past Surgical History:  Procedure Laterality Date  . CARDIAC CATHETERIZATION     nonobstructive ASCAD of the RCA of 40-50%, 60% D1, and 70% inferior branch of D1  . CORONARY ANGIOPLASTY WITH STENT PLACEMENT  01/272015   FFR and 3.0 x 38 mm Alpine DES to the LAD  . LEFT HEART CATHETERIZATION WITH CORONARY ANGIOGRAM N/A 04/13/2013   Procedure: LEFT HEART CATHETERIZATION WITH CORONARY ANGIOGRAM;  Surgeon: Sueanne Margarita, MD;  Location: [facility] CATH LAB;  Service: Cardiovascular;  Laterality: N/A;  . LEFT HEART CATHETERIZATION WITH CORONARY ANGIOGRAM N/A 05/12/2013   Procedure: LEFT HEART CATHETERIZATION WITH CORONARY ANGIOGRAM;  Surgeon: Burnell Blanks, MD;  Location: [facility] CATH LAB;  Service: Cardiovascular;  Laterality: N/A;  .  LEFT HEART CATHETERIZATION WITH CORONARY ANGIOGRAM N/A 12/03/2013   Procedure: LEFT HEART CATHETERIZATION WITH CORONARY ANGIOGRAM;  Surgeon: Jettie Booze, MD;  Location: [facility] CATH LAB;  Service: Cardiovascular;  Laterality: N/A;  . TOTAL HIP ARTHROPLASTY  02/11/2011   Procedure: TOTAL HIP ARTHROPLASTY;  Surgeon: Dione Plover Aluisio;  Location: [facility] ORS;  Service: Orthopedics;  Laterality: Right;  . TOTAL HIP ARTHROPLASTY Left     Current Medications: Current Outpatient Prescriptions    Medication Sig Dispense Refill  . amLODipine (NORVASC) 5 MG tablet Take 1 tablet (5 mg total) by mouth daily. 30 tablet 11  . aspirin EC 81 MG tablet Take 1 tablet (81 mg total) by mouth daily.    . carvedilol (COREG) 12.5 MG tablet Take 1 tablet (12.5 mg total) by mouth 2 (two) times daily with a meal. 60 tablet 11  . clopidogrel (PLAVIX) 75 MG tablet TAKE 1 TABLET (75 MG TOTAL) BY MOUTH DAILY. 30 tablet 11  . DULoxetine (CYMBALTA) 60 MG capsule Take 60 mg by mouth daily.  5  . isosorbide mononitrate (IMDUR) 60 MG 24 hr tablet Take 1 and 1/2 tablets ( 90 mg) by mouth daily 135 tablet 1  . nitroGLYCERIN (NITROSTAT) 0.4 MG SL tablet Place 1 tablet (0.4 mg total) under the tongue every 5 (five) minutes as needed for chest pain. 25 tablet 3  . pantoprazole (PROTONIX) 40 MG tablet TAKE 1 TABLET (40 MG TOTAL) BY MOUTH DAILY. 30 tablet 7  . tetrahydrozoline-zinc (VISINE-AC) 0.05-0.25 % ophthalmic solution Place 2 drops into both eyes 3 (three) times daily as needed (redness).      Current Facility-Administered Medications  Medication Dose Route Frequency Provider Last Rate Last Dose  . 0.9 %  sodium chloride infusion  500 mL Intravenous Continuous Ladene Artist, MD         Allergies:   Patient has no known allergies.   Social History   Social History  . Marital status: Married    Spouse name: N/A  . Number of children: 1  . Years of education: N/A   Occupational History  . sales Electrical engineer   Social History Main Topics  . Smoking status: Never Smoker  . Smokeless tobacco: Never Used  . Alcohol use 7.2 oz/week    12 Cans of beer per week  . Drug use: No  . Sexual activity: Not Asked   Other Topics Concern  . None   Social History Narrative  . None     Family History:  Family History  Problem Relation Age of Onset  . Epilepsy Brother 70  . Stroke Brother   . Aneurysm Brother        brain  . Heart attack Neg Hx   . Colon cancer Neg Hx   . Esophageal cancer Neg Hx   .  Stomach cancer Neg Hx   . Pancreatic cancer Neg Hx   . Colon polyps Neg Hx     ROS:   Please see the history of present illness. All other systems are reviewed and otherwise negative.    PHYSICAL EXAM:   VS:  BP (!) 162/100   Pulse 79   Ht 5\' 10"  (1.778 m)   Wt 192 lb 12.8 oz (87.5 kg)   BMI 27.66 kg/m   BMI: Body mass index is 27.66 kg/m. GEN: Well nourished, well developed WM, in no acute distress  HEENT: normocephalic, atraumatic Neck: no JVD, carotid bruits, or masses Cardiac:RRR; no murmurs, rubs, or gallops, no edema  Respiratory:  clear to auscultation bilaterally, normal work of breathing GI: soft, nontender, nondistended, + BS MS: no deformity or atrophy  Skin: warm and dry, no rash Neuro:  Alert and Oriented x 3, Strength and sensation are intact, follows commands Psych: euthymic mood, full affect  Wt Readings from Last 3 Encounters:  08/28/16 192 lb 12.8 oz (87.5 kg)  08/14/16 193 lb (87.5 kg)  08/08/16 193 lb (87.5 kg)      Studies/Labs Reviewed:   EKG:  EKG was ordered today and personally reviewed by me and demonstrates NSR 79bpm, no acute ST-T changes.  Recent Labs: 07/24/2016: ALT 26 08/08/2016: BUN 14; Creatinine, Ser 0.83; Potassium 4.3; Sodium 140 08/15/2016: NT-Pro BNP 56   Lipid Panel    Component Value Date/Time   CHOL 265 (H) 07/24/2016 1250   TRIG 80 07/24/2016 1250   HDL 61 07/24/2016 1250   CHOLHDL 4.3 07/24/2016 1250   CHOLHDL 3.8 01/12/2016 1154   VLDL 13 01/12/2016 1154   LDLCALC 188 (H) 07/24/2016 1250   LDLDIRECT 147.2 03/24/2013 1007    Additional studies/ records that were reviewed today include: Summarized above.    ASSESSMENT & PLAN:   1. Shortness of breath - recent abnormal stress test suggestive of inferior ischemia. He continues to have symptoms despite maximizing medical regimen. He already feels he does not tolerate his present regimen well due to fatigue. I suspect this is likely multifactorial also in the setting  of suboptimally controlled OSA. Last cath 2015 had OM as well as RCA disease, question progression given uncontrolled hyperlipidemia. Discussed with Dr. Radford Pax. At this time we feel cath would be beneficial to further evaluate his anatomy. Risks and benefits of cardiac catheterization have been discussed with the patient. These include bleeding, infection, kidney damage, stroke, heart attack, death. The patient understands these risks and is willing to proceed. Will check pre-cath labs today. He does not wish to proceed until early next week. Warning sx/ER precautions reviewed. Will refill SL NTG as requested. 2. CAD - see above. Continue present regimen. 3. Essential HTN - BP is running high today but he states he did not take anything before coming to today's visit. He states home BP runs 130s/80s. Ideally would further titrate medications to a goal of <120/80 but he seems generally intolerant to even the regimen he is taking at present time. Suspect incomplete CPAP compliance is also contributing. He says he had some sinus issues the last few weeks which were preventing him from using it but plans to get back on board. Would follow up BP post-cath to determine if further adjustment is warranted. 4. Hyperlipidemia - he states he did have a conversation with lipid clinic about this. He is unwilling to pay for a PCSK9 at this time. He was offered enrolling in a trial but states he plans on moving in a year or so and does not wish to do so. He has not tried Zetia before, but is willing to do so. Will check LFTs with labs today and initiate Zetia 10mg  daily. If tolerating by the time of next visit will need repeat labs about 4-6 weeks thereafter.  Disposition: F/u with Dr. Betsy Pries team APP 1 week after cath.   Medication Adjustments/Labs and Tests Ordered: Current medicines are reviewed at length with the patient today.  Concerns regarding medicines are outlined above. Medication changes, Labs and Tests  ordered today are summarized above and listed in the Patient Instructions accessible in Encounters.   Signed, Nedra Hai  Idolina Primer, PA-C  08/28/2016 10:16 AM    [facility] [place], [place], [place]  [address] Phone: [phone]; Fax: [phone]

## 2016-09-03 NOTE — Telephone Encounter (Signed)
Received call from Dr. Claiborne Billings to clarify type of procedure patient is to undergo today. On day of OV 08/28/16, I had requested a left heart catheterization. After patien was released by nurste, I did notice on day of procedure that this was listed on the board as a right and left heart catheterization so I contacted the cath lab and spoke to Santiago Glad to clarify this was supposed to be a left heart cath only. Dr. Claiborne Billings verbalized understanding. Dayna Dunn PA-C

## 2016-09-03 NOTE — Interval H&P Note (Signed)
Cath Lab Visit (complete for each Cath Lab visit)  Clinical Evaluation Leading to the Procedure:   ACS: No.  Non-ACS:    Anginal Classification: CCS III  Anti-ischemic medical therapy: Maximal Therapy (2 or more classes of medications)  Non-Invasive Test Results: Low-risk stress test findings: cardiac mortality <1%/year  Prior CABG: No previous CABG      History and Physical Interval Note:  09/03/2016 10:00 AM  Larry Arroyo  has presented today for surgery, with the diagnosis of shortness of breath - abnormal stress test  The various methods of treatment have been discussed with the patient and family. After consideration of risks, benefits and other options for treatment, the patient has consented to  Procedure(s): Left Heart Cath and Coronary Angiography (N/A) as a surgical intervention .  The patient's history has been reviewed, patient examined, no change in status, stable for surgery.  I have reviewed the patient's chart and labs.  Questions were answered to the patient's satisfaction.     Shelva Majestic

## 2016-09-03 NOTE — Discharge Instructions (Signed)

## 2016-09-04 ENCOUNTER — Encounter (HOSPITAL_COMMUNITY): Payer: Self-pay | Admitting: Cardiovascular Disease

## 2016-09-04 MED FILL — Heparin Sodium (Porcine) 2 Unit/ML in Sodium Chloride 0.9%: INTRAMUSCULAR | Qty: 500 | Status: AC

## 2016-09-04 MED FILL — Nitroglycerin IV Soln 100 MCG/ML in D5W: INTRA_ARTERIAL | Qty: 10 | Status: AC

## 2016-09-25 ENCOUNTER — Ambulatory Visit: Payer: Medicare Other | Admitting: Nurse Practitioner

## 2016-09-30 DIAGNOSIS — R69 Illness, unspecified: Secondary | ICD-10-CM | POA: Diagnosis not present

## 2016-10-02 DIAGNOSIS — Z0000 Encounter for general adult medical examination without abnormal findings: Secondary | ICD-10-CM | POA: Diagnosis not present

## 2016-10-02 DIAGNOSIS — F324 Major depressive disorder, single episode, in partial remission: Secondary | ICD-10-CM | POA: Diagnosis not present

## 2016-10-02 DIAGNOSIS — R05 Cough: Secondary | ICD-10-CM | POA: Diagnosis not present

## 2016-10-02 DIAGNOSIS — K219 Gastro-esophageal reflux disease without esophagitis: Secondary | ICD-10-CM | POA: Diagnosis not present

## 2016-10-10 ENCOUNTER — Ambulatory Visit: Payer: Medicare Other | Admitting: Physician Assistant

## 2016-10-16 DIAGNOSIS — R05 Cough: Secondary | ICD-10-CM | POA: Diagnosis not present

## 2016-10-16 DIAGNOSIS — J45909 Unspecified asthma, uncomplicated: Secondary | ICD-10-CM | POA: Diagnosis not present

## 2016-10-31 ENCOUNTER — Institutional Professional Consult (permissible substitution): Payer: Medicare Other | Admitting: Internal Medicine

## 2016-11-05 ENCOUNTER — Other Ambulatory Visit: Payer: Self-pay | Admitting: Cardiology

## 2016-11-13 ENCOUNTER — Other Ambulatory Visit: Payer: Self-pay | Admitting: Cardiology

## 2016-11-25 DIAGNOSIS — M1611 Unilateral primary osteoarthritis, right hip: Secondary | ICD-10-CM | POA: Diagnosis not present

## 2016-11-25 DIAGNOSIS — Z471 Aftercare following joint replacement surgery: Secondary | ICD-10-CM | POA: Diagnosis not present

## 2016-11-25 DIAGNOSIS — M7061 Trochanteric bursitis, right hip: Secondary | ICD-10-CM | POA: Diagnosis not present

## 2016-11-25 DIAGNOSIS — Z96641 Presence of right artificial hip joint: Secondary | ICD-10-CM | POA: Diagnosis not present

## 2017-01-02 DIAGNOSIS — Z96641 Presence of right artificial hip joint: Secondary | ICD-10-CM | POA: Diagnosis not present

## 2017-01-02 DIAGNOSIS — Z09 Encounter for follow-up examination after completed treatment for conditions other than malignant neoplasm: Secondary | ICD-10-CM | POA: Diagnosis not present

## 2017-01-15 DIAGNOSIS — Z471 Aftercare following joint replacement surgery: Secondary | ICD-10-CM | POA: Diagnosis not present

## 2017-01-15 DIAGNOSIS — Z96641 Presence of right artificial hip joint: Secondary | ICD-10-CM | POA: Diagnosis not present

## 2017-01-23 DIAGNOSIS — M5441 Lumbago with sciatica, right side: Secondary | ICD-10-CM | POA: Diagnosis not present

## 2017-01-23 DIAGNOSIS — M7061 Trochanteric bursitis, right hip: Secondary | ICD-10-CM | POA: Diagnosis not present

## 2017-01-27 ENCOUNTER — Ambulatory Visit: Payer: Medicare Other | Admitting: Physician Assistant

## 2017-01-29 ENCOUNTER — Telehealth: Payer: Self-pay

## 2017-01-29 NOTE — Telephone Encounter (Signed)
OK to hold Plavix for procedure if needed

## 2017-01-29 NOTE — Telephone Encounter (Signed)
   Chart reviewed as part of pre-operative protocol coverage. Given past medical history and time since last visit, based on ACC/AHA guidelines, YAREL RUSHLOW would be at acceptable risk for the planned procedure without further cardiovascular testing. He is doing well since seen by APP 08/28/16.   Will route to Dr.Turner to review Plavix.   [place], PA 01/29/2017, 4:23 PM

## 2017-01-29 NOTE — Telephone Encounter (Signed)
   [facility] Pre-operative Risk Assessment    Request for surgical clearance:  1. What type of surgery is being performed? Orthopaedic injection   2. When is this surgery scheduled? 02/13/17   3. Are there any medications that need to be held prior to surgery and how long?plavix 5 days prior to injection   4. Practice name and name of physician performing surgery? [facility]   5. What is your office phone and fax number? Ph [phone] ext [phone]  Fax: [phone]   6. Anesthesia type (None, local, MAC, general) ? Not stated   Larry Arroyo 01/29/2017, 1:19 PM  _________________________________________________________________   (provider comments below)

## 2017-01-30 NOTE — Telephone Encounter (Signed)
Faxed Clearance via [employer].   [place], PA

## 2017-02-04 DIAGNOSIS — J069 Acute upper respiratory infection, unspecified: Secondary | ICD-10-CM | POA: Diagnosis not present

## 2017-02-11 DIAGNOSIS — J018 Other acute sinusitis: Secondary | ICD-10-CM | POA: Diagnosis not present

## 2017-02-13 DIAGNOSIS — M5441 Lumbago with sciatica, right side: Secondary | ICD-10-CM | POA: Diagnosis not present

## 2017-03-24 DIAGNOSIS — Z23 Encounter for immunization: Secondary | ICD-10-CM | POA: Diagnosis not present

## 2017-07-16 ENCOUNTER — Other Ambulatory Visit: Payer: Self-pay | Admitting: Cardiology

## 2017-07-18 ENCOUNTER — Other Ambulatory Visit: Payer: Self-pay | Admitting: Cardiology

## 2017-07-18 NOTE — Telephone Encounter (Signed)
Outpatient Medication Detail    Disp Refills Start End   clopidogrel (PLAVIX) 75 MG tablet 30 tablet 0 07/16/2017    Sig: TAKE 1 TABLET (75 MG TOTAL) BY MOUTH DAILY.   Sent to pharmacy as: clopidogrel (PLAVIX) 75 MG tablet   E-Prescribing Status: Receipt confirmed by pharmacy (07/16/2017 10:56 AM EDT)   Pharmacy   [id number] - [place], [place] - [address]

## 2017-07-23 ENCOUNTER — Other Ambulatory Visit: Payer: Self-pay | Admitting: Cardiology

## 2017-07-24 DIAGNOSIS — M48061 Spinal stenosis, lumbar region without neurogenic claudication: Secondary | ICD-10-CM | POA: Diagnosis not present

## 2017-07-24 DIAGNOSIS — M5136 Other intervertebral disc degeneration, lumbar region: Secondary | ICD-10-CM | POA: Diagnosis not present

## 2017-07-29 DIAGNOSIS — M48061 Spinal stenosis, lumbar region without neurogenic claudication: Secondary | ICD-10-CM | POA: Diagnosis not present

## 2017-07-29 DIAGNOSIS — M5136 Other intervertebral disc degeneration, lumbar region: Secondary | ICD-10-CM | POA: Diagnosis not present

## 2017-07-29 DIAGNOSIS — M545 Low back pain: Secondary | ICD-10-CM | POA: Diagnosis not present

## 2017-07-30 ENCOUNTER — Other Ambulatory Visit: Payer: Self-pay | Admitting: Cardiology

## 2017-08-05 DIAGNOSIS — M48061 Spinal stenosis, lumbar region without neurogenic claudication: Secondary | ICD-10-CM | POA: Diagnosis not present

## 2017-08-09 ENCOUNTER — Other Ambulatory Visit: Payer: Self-pay | Admitting: Physician Assistant

## 2017-08-19 DIAGNOSIS — M5416 Radiculopathy, lumbar region: Secondary | ICD-10-CM | POA: Diagnosis not present

## 2017-08-27 ENCOUNTER — Other Ambulatory Visit: Payer: Self-pay | Admitting: Cardiology

## 2017-08-27 NOTE — Telephone Encounter (Signed)
Outpatient Medication Detail    Disp Refills Start End   amLODipine (NORVASC) 5 MG tablet 30 tablet 1 07/30/2017    Sig: TAKE 1 TABLET BY MOUTH EVERY DAY   Sent to pharmacy as: amLODipine (NORVASC) 5 MG tablet   Notes to Pharmacy: Please call office and schedule appointment for further refills   E-Prescribing Status: Receipt confirmed by pharmacy (07/30/2017 2:41 PM EDT)   Pharmacy   [id number] - [place], [place] - [address]

## 2017-09-09 DIAGNOSIS — M5416 Radiculopathy, lumbar region: Secondary | ICD-10-CM | POA: Diagnosis not present

## 2017-09-17 ENCOUNTER — Other Ambulatory Visit: Payer: Self-pay | Admitting: Physician Assistant

## 2017-09-25 ENCOUNTER — Ambulatory Visit: Payer: Medicare Other | Admitting: Cardiology

## 2017-09-25 ENCOUNTER — Encounter: Payer: Self-pay | Admitting: Cardiology

## 2017-09-25 VITALS — BP 144/102 | HR 90 | Ht 70.0 in | Wt 195.0 lb

## 2017-09-25 DIAGNOSIS — I2510 Atherosclerotic heart disease of native coronary artery without angina pectoris: Secondary | ICD-10-CM | POA: Diagnosis not present

## 2017-09-25 DIAGNOSIS — Z5181 Encounter for therapeutic drug level monitoring: Secondary | ICD-10-CM

## 2017-09-25 DIAGNOSIS — I10 Essential (primary) hypertension: Secondary | ICD-10-CM

## 2017-09-25 DIAGNOSIS — E785 Hyperlipidemia, unspecified: Secondary | ICD-10-CM | POA: Diagnosis not present

## 2017-09-25 MED ORDER — CLOPIDOGREL BISULFATE 75 MG PO TABS: 75.0000 mg | ORAL_TABLET | Freq: Every day | ORAL | 3 refills | Status: AC

## 2017-09-25 MED ORDER — AMLODIPINE BESYLATE 5 MG PO TABS: 5.0000 mg | ORAL_TABLET | Freq: Every day | ORAL | 3 refills | Status: AC

## 2017-09-25 MED ORDER — ISOSORBIDE MONONITRATE ER 60 MG PO TB24
90.0000 mg | ORAL_TABLET | Freq: Every day | ORAL | 3 refills | Status: DC
Start: 2017-09-25 — End: 2018-06-24

## 2017-09-25 MED ORDER — NITROGLYCERIN 0.4 MG SL SUBL: 0.4000 mg | SUBLINGUAL_TABLET | SUBLINGUAL | 3 refills | Status: DC | PRN

## 2017-09-25 MED ORDER — EZETIMIBE 10 MG PO TABS: 10.0000 mg | ORAL_TABLET | Freq: Every day | ORAL | 3 refills | Status: AC

## 2017-09-25 MED ORDER — CARVEDILOL 25 MG PO TABS: 25.0000 mg | ORAL_TABLET | Freq: Two times a day (BID) | ORAL | 3 refills | Status: AC

## 2017-09-25 NOTE — Progress Notes (Signed)
09/25/2017 Larry Arroyo   18-Oct-1950  [id number]  Primary Physician [name], MD Primary Cardiologist: Dr. Radford Pax   Reason for Visit/CC: f/u for CAD  HPI:  Larry Arroyo is a 67 y.o. male who is being seen today for the evaluation of CAD and HTN.   He is followed by Dr. Radford Pax and has a history of CAD (s/p prior stenting in LAD 03/2013), depression, diastolic dysfunction, HTN, HL (statin intolerance), OSA noncompliant with CPAP, who presents for his yearly f/u.  LHC in 11/2013 showed patent LAD stents, 80% prox D1, mild prox D2 disease, 50-60% OM1 (neg FFR), tortuous RCA with mild-moderate mid vessel disease. 2D echo 08/08/16 showed moderate LVH, EF 60-65%, grade 1 DD. Nuclear stress test 08/08/16 showed inferior thinning and mild inferior ischemia, EF 60% with normal wall motion.   He had exertional dyspnea and CP in May of 2018 and underwent a NST that was read as abnormal. Subsequently, he was referred for [facility] performed by Dr. Claiborne Billings in June 2018. This showed mild nonobstructive coronary disease with a patent stent in the proximal LAD with diffuse narrowing of 60% in a small first diagonal branch of the LAD, 30% stenosis in the second diagonal vessel, which is improved from his last catheterization; normal left circumflex coronary artery; tortuous RCA with mild 30 and 20% stenoses before and after the acute margin. No indication for PCI. Medial management was recommended. His Imdur was increased and he has done well since.   He is here today for f/u and is in need of med refills. Denies CP and dyspnea. BP is elevated at 144/102. Pt notes my BP is "always high". This is not uncommon. He reports full med compliance. He has chronic low back pain, being managed by spinal injections. Reports that he is not compliant w/ CPAP. Cannot tolerate device. He is also statin intolerant. Last lipid panel in 2018 showed LDL of 188 mg/dL. We discussed PCSK9 inhibitor therapy but he refuses to take any  additional meds, despite the benefit.    Cardiac Studies  1st Diag lesion, 60 %stenosed.  Ost LAD to Mid LAD lesion, 0 %stenosed.  2nd Diag lesion, 30 %stenosed.  Mid RCA lesion, 30 %stenosed.  Dist RCA lesion, 20 %stenosed.   Normal LV function with an EF of at least 55% without focal segmental wall motion abnormalities.  Mild nonobstructive coronary disease with a patent stent in the proximal LAD with diffuse narrowing of 60% in a small first diagonal branch of the LAD, 30% stenosis in the second diagonal vessel, which is improved from his last catheterization; normal left circumflex coronary artery; tortuous RCA with mild 30 and 20% stenoses before and after the acute margin.  RECOMMENDATION: Medical therapy.  I suspect the patient's nuclear study suggesting inferior ischemia is artifactual.  Current Meds  Medication Sig  . amLODipine (NORVASC) 5 MG tablet TAKE 1 TABLET BY MOUTH EVERY DAY  . aspirin EC 81 MG tablet Take 1 tablet (81 mg total) by mouth daily.  Marland Kitchen aspirin-acetaminophen-caffeine (EXCEDRIN MIGRAINE) 250-250-65 MG tablet Take 1 tablet by mouth every 6 (six) hours as needed (for severe headache pain.).  Marland Kitchen carvedilol (COREG) 12.5 MG tablet Take 1 tablet (12.5 mg total) by mouth 2 (two) times daily with a meal.  . clopidogrel (PLAVIX) 75 MG tablet TAKE 1 TABLET BY MOUTH DAILY. PLEASE MAKE APPT WITH DR FOR JUNE BEFORE ANYMORE REFILLS. 1ST ATTEMPT  . DULoxetine (CYMBALTA) 60 MG capsule Take 60 mg by mouth  daily.  . isosorbide mononitrate (IMDUR) 60 MG 24 hr tablet Take 1.5 tablets (90 mg total) by mouth daily. Please make yearly appt with Dr. Radford Pax for June before anymore refills. 1st attempt  . nitroGLYCERIN (NITROSTAT) 0.4 MG SL tablet Place 1 tablet (0.4 mg total) under the tongue every 5 (five) minutes as needed for chest pain.  . traMADol (ULTRAM) 50 MG tablet Ultram 50 mg tablet  Take 1 tablet 3 times a day by oral route as needed.   Current Facility-Administered  Medications for the 09/25/17 encounter (Office Visit) with Consuelo Pandy, PA-C  Medication  . 0.9 %  sodium chloride infusion   No Known Allergies Past Medical History:  Diagnosis Date  . Anxiety   . Arthritis   . Chronic stable angina ([place])   . Claudication ([place]) 07/20/2015  . Colon polyps 02/05/2010  . Complication of anesthesia    16 yrs ago following hip replacement states took a long time for full memory to return  . Coronary artery disease    a. s/p prior stenting in LAD 03/2013. b. Last LHC in 11/2013 showed patent LAD stents, 80% prox D1, mild prox D2 disease, 50-60% OM1 (neg FFR), tortuous RCA with mild-moderate mid vessel disease. He recently noticed increased dyspnea with normal activities. 2D echo 08/08/16 showed moderate LVH, EF 60-65%, grade 1 DD. Nuclear stress test 08/08/16 showed inferior thinning and mild inferior ischemia, EF   . Depression   . Hypercholesteremia   . Hypertension   . Myocardial infarction ([place])   . OSA (obstructive sleep apnea) 01/12/2016   Moderate with AHI 19/hr now on CPAP at 15cm H2O   Family History  Problem Relation Age of Onset  . Epilepsy Brother 46  . Stroke Brother   . Aneurysm Brother        brain  . Heart attack Neg Hx   . Colon cancer Neg Hx   . Esophageal cancer Neg Hx   . Stomach cancer Neg Hx   . Pancreatic cancer Neg Hx   . Colon polyps Neg Hx    Past Surgical History:  Procedure Laterality Date  . CARDIAC CATHETERIZATION     nonobstructive ASCAD of the RCA of 40-50%, 60% D1, and 70% inferior branch of D1  . CORONARY ANGIOPLASTY WITH STENT PLACEMENT  01/272015   FFR and 3.0 x 38 mm [place] DES to the LAD  . LEFT HEART CATH AND CORONARY ANGIOGRAPHY N/A 09/03/2016   Procedure: Left Heart Cath and Coronary Angiography;  Surgeon: Troy Sine, MD;  Location: [facility];  Service: Cardiovascular;  Laterality: N/A;  . LEFT HEART CATHETERIZATION WITH CORONARY ANGIOGRAM N/A 04/13/2013   Procedure: LEFT HEART  CATHETERIZATION WITH CORONARY ANGIOGRAM;  Surgeon: Sueanne Margarita, MD;  Location: [facility];  Service: Cardiovascular;  Laterality: N/A;  . LEFT HEART CATHETERIZATION WITH CORONARY ANGIOGRAM N/A 05/12/2013   Procedure: LEFT HEART CATHETERIZATION WITH CORONARY ANGIOGRAM;  Surgeon: Burnell Blanks, MD;  Location: [facility] LAB;  Service: Cardiovascular;  Laterality: N/A;  . LEFT HEART CATHETERIZATION WITH CORONARY ANGIOGRAM N/A 12/03/2013   Procedure: LEFT HEART CATHETERIZATION WITH CORONARY ANGIOGRAM;  Surgeon: Jettie Booze, MD;  Location: [facility] CATH LAB;  Service: Cardiovascular;  Laterality: N/A;  . TOTAL HIP ARTHROPLASTY  02/11/2011   Procedure: TOTAL HIP ARTHROPLASTY;  Surgeon: Dione Plover Aluisio;  Location: [facility] OR[facility];  Service: Orthopedics;  Laterality: Right;  . TOTAL HIP ARTHROPLASTY Left    Social History   Socioeconomic History  .  Marital status: Married    Spouse name: Not on file  . Number of children: 1  . Years of education: Not on file  . Highest education level: Not on file  Occupational History  . Occupation: Scientist, clinical (histocompatibility and immunogenetics): [employer]  Social Needs  . Financial resource strain: Not on file  . Food insecurity:    Worry: Not on file    Inability: Not on file  . Transportation needs:    Medical: Not on file    Non-medical: Not on file  Tobacco Use  . Smoking status: Never Smoker  . Smokeless tobacco: Never Used  Substance and Sexual Activity  . Alcohol use: Yes    Alcohol/week: 7.2 oz    Types: 12 Cans of beer per week  . Drug use: No  . Sexual activity: Not on file  Lifestyle  . Physical activity:    Days per week: Not on file    Minutes per session: Not on file  . Stress: Not on file  Relationships  . Social connections:    Talks on phone: Not on file    Gets together: Not on file    Attends religious service: Not on file    Active member of club or organization: Not on file    Attends meetings of clubs or organizations: Not on file     Relationship status: Not on file  . Intimate partner violence:    Fear of current or ex partner: Not on file    Emotionally abused: Not on file    Physically abused: Not on file    Forced sexual activity: Not on file  Other Topics Concern  . Not on file  Social History Narrative  . Not on file     Review of Systems: General: negative for chills, fever, night sweats or weight changes.  Cardiovascular: negative for chest pain, dyspnea on exertion, edema, orthopnea, palpitations, paroxysmal nocturnal dyspnea or shortness of breath Dermatological: negative for rash Respiratory: negative for cough or wheezing Urologic: negative for hematuria Abdominal: negative for nausea, vomiting, diarrhea, bright red blood per rectum, melena, or hematemesis Neurologic: negative for visual changes, syncope, or dizziness All other systems reviewed and are otherwise negative except as noted above.   Physical Exam:  Blood pressure (!) 144/102, pulse 90, height 5\' 10"  (1.778 m), weight 195 lb (88.5 kg), SpO2 97 %.  General appearance: alert, cooperative and no distress Neck: no carotid bruit and no JVD Lungs: clear to auscultation bilaterally Heart: regular rate and rhythm, S1, S2 normal, no murmur, click, rub or gallop Extremities: extremities normal, atraumatic, no cyanosis or edema Pulses: 2+ and symmetric Skin: Skin color, texture, turgor normal. No rashes or lesions Neurologic: Grossly normal  EKG NSR 90 bpm  -- personally reviewed   ASSESSMENT AND PLAN:   1. CAD: prior coronary stenting as outlined above in HPI. Last [facility] June 2018 showed mild nonobstructive coronary disease with a patent stent in the proximal LAD with diffuse narrowing of 60% in a small first diagonal branch of the LAD, 30% stenosis in the second diagonal vessel, which is improved from his last catheterization; normal left circumflex coronary artery; tortuous RCA with mild 30 and 20% stenoses before and after the acute margin.  Medical therapy elected. He has done well w/o recurrent CP or dyspnea. Continue ASA, Plavix, BB and LA nitrate. Statin intolerant. Will check CBC and CMP for medication monitoring.    2. HTN: elevated, despite reported med compliance. We will  increase Coreg to 25 mg BID. Continue all meds the same. F/u in HTN clinic in 2-3 weeks. BP goal is <130/80. If not at goal, can increase amlodipine from 5 to 10 mg daily.   3. HLD: poorly controlled. Last Lipid panel in 2018 showed elevated LDL at 188 mg/dL. He is statin intolerant. I strongly encouraged consideration of starting PCSK9 inhibitor therapy for LDL reduction and reduce CV risk, however he strongly opposes this. He had an appt previously in Lipid clinic and refuses to reconsider. Exercise is limited due to chronic LBP. I encouraged a low fat diet.   4. OSA: noncompliant with CPAP. Cannot tolerate device.   5. Chronic LBP: sees back specialist. Conservative management w/ no plans for surgery, per pt report.  Follow-Up w/ Dr. Radford Pax in 6 months.   Larry Arroyo, MHS [facility] 09/25/2017 2:42 PM

## 2017-09-25 NOTE — Patient Instructions (Signed)
Medication Instructions:  1. REFILLS HAVE BEEN SENT IN FOR YOUR CARDIAC MEDICATIONS  2. INCREASE COREG TO 25 MG 1 TABLET TWICE DAILY; NEW RX HAS BEEN SENT IN  Labwork: TODAY FASTING LIPID/LIVER PANEL, BMET, CBC   Testing/Procedures: NONE ORDERED TODAY  Follow-Up: 1. DR. TURNER IN 6 MONTHS   2. YOU WILL NEED AN APPT WITH THE HTN CLINIC IN 2-3 WEEKS   Any Other Special Instructions Will Be Listed Below (If Applicable).     If you need a refill on your cardiac medications before your next appointment, please call your pharmacy.

## 2017-09-26 LAB — CBC
HEMOGLOBIN: 14.3 g/dL (ref 13.0–17.7)
Hematocrit: 42.5 % (ref 37.5–51.0)
MCH: 29.2 pg (ref 26.6–33.0)
MCHC: 33.6 g/dL (ref 31.5–35.7)
MCV: 87 fL (ref 79–97)
Platelets: 227 10*3/uL (ref 150–450)
RBC: 4.9 x10E6/uL (ref 4.14–5.80)
RDW: 14.8 % (ref 12.3–15.4)
WBC: 7.1 10*3/uL (ref 3.4–10.8)

## 2017-09-26 LAB — BASIC METABOLIC PANEL
BUN / CREAT RATIO: 15 (ref 10–24)
BUN: 16 mg/dL (ref 8–27)
CO2: 22 mmol/L (ref 20–29)
CREATININE: 1.05 mg/dL (ref 0.76–1.27)
Calcium: 9.8 mg/dL (ref 8.6–10.2)
Chloride: 101 mmol/L (ref 96–106)
GFR calc Af Amer: 84 mL/min/{1.73_m2} (ref >59)
GFR calc non Af Amer: 73 mL/min/{1.73_m2} (ref >59)
Glucose: 101 mg/dL — ABNORMAL HIGH (ref 65–99)
Potassium: 4.3 mmol/L (ref 3.5–5.2)
Sodium: 140 mmol/L (ref 134–144)

## 2017-09-26 LAB — HEPATIC FUNCTION PANEL
ALBUMIN: 4.7 g/dL (ref 3.6–4.8)
ALT: 23 IU/L (ref 0–44)
AST: 13 IU/L (ref 0–40)
Alkaline Phosphatase: 89 IU/L (ref 39–117)
BILIRUBIN TOTAL: 0.4 mg/dL (ref 0.0–1.2)
Bilirubin, Direct: 0.12 mg/dL (ref 0.00–0.40)
Total Protein: 7.1 g/dL (ref 6.0–8.5)

## 2017-09-26 LAB — LIPID PANEL
CHOLESTEROL TOTAL: 272 mg/dL — AB (ref 100–199)
Chol/HDL Ratio: 4.9 ratio (ref 0.0–5.0)
HDL: 55 mg/dL (ref >39)
LDL CALC: 190 mg/dL — AB (ref 0–99)
Triglycerides: 136 mg/dL (ref 0–149)
VLDL Cholesterol Cal: 27 mg/dL (ref 5–40)

## 2017-09-30 DIAGNOSIS — M5416 Radiculopathy, lumbar region: Secondary | ICD-10-CM | POA: Diagnosis not present

## 2017-10-02 ENCOUNTER — Encounter: Payer: Self-pay | Admitting: Cardiology

## 2017-10-23 ENCOUNTER — Ambulatory Visit: Payer: Medicare Other

## 2017-11-11 DIAGNOSIS — Z125 Encounter for screening for malignant neoplasm of prostate: Secondary | ICD-10-CM | POA: Diagnosis not present

## 2017-11-11 DIAGNOSIS — I10 Essential (primary) hypertension: Secondary | ICD-10-CM | POA: Diagnosis not present

## 2017-11-11 DIAGNOSIS — E7800 Pure hypercholesterolemia, unspecified: Secondary | ICD-10-CM | POA: Diagnosis not present

## 2017-11-12 DIAGNOSIS — F324 Major depressive disorder, single episode, in partial remission: Secondary | ICD-10-CM | POA: Diagnosis not present

## 2017-11-12 DIAGNOSIS — E7800 Pure hypercholesterolemia, unspecified: Secondary | ICD-10-CM | POA: Diagnosis not present

## 2017-11-12 DIAGNOSIS — I10 Essential (primary) hypertension: Secondary | ICD-10-CM | POA: Diagnosis not present

## 2017-11-12 DIAGNOSIS — Z0000 Encounter for general adult medical examination without abnormal findings: Secondary | ICD-10-CM | POA: Diagnosis not present

## 2017-12-02 DIAGNOSIS — M5416 Radiculopathy, lumbar region: Secondary | ICD-10-CM | POA: Diagnosis not present

## 2017-12-20 ENCOUNTER — Other Ambulatory Visit: Payer: Self-pay | Admitting: Cardiology

## 2017-12-20 DIAGNOSIS — I2510 Atherosclerotic heart disease of native coronary artery without angina pectoris: Secondary | ICD-10-CM

## 2017-12-31 ENCOUNTER — Ambulatory Visit: Payer: Medicare Other

## 2018-01-01 DIAGNOSIS — H524 Presbyopia: Secondary | ICD-10-CM | POA: Diagnosis not present

## 2018-01-15 ENCOUNTER — Ambulatory Visit (INDEPENDENT_AMBULATORY_CARE_PROVIDER_SITE_OTHER): Payer: Medicare Other | Admitting: Pharmacist

## 2018-01-15 ENCOUNTER — Other Ambulatory Visit: Payer: Self-pay | Admitting: Cardiology

## 2018-01-15 VITALS — BP 142/108 | HR 82

## 2018-01-15 DIAGNOSIS — I2510 Atherosclerotic heart disease of native coronary artery without angina pectoris: Secondary | ICD-10-CM

## 2018-01-15 DIAGNOSIS — I10 Essential (primary) hypertension: Secondary | ICD-10-CM | POA: Diagnosis not present

## 2018-01-15 MED ORDER — VALSARTAN 80 MG PO TABS: 80.0000 mg | ORAL_TABLET | Freq: Every day | ORAL | 1 refills | Status: AC

## 2018-01-15 NOTE — Progress Notes (Signed)
Patient ID: Larry Arroyo                 DOB: [birth year]                      MRN: [id number]     HPI: Larry Arroyo is a 67 y.o. male patient of Dr. Radford Pax who presents today for hypertension evaluation. He has previously been seen in lipid clinic in 2017 by Elberta Leatherwood, PharmD. PMH significant for CAD (s/p prior stenting in LAD 03/2013), depression, diastolic dysfunction, HTN, HL (statin intolerance), OSA noncompliant with CPAP, who presents for his yearly f/u.  LHC in 11/2013 showed patent LAD stents, 80% prox D1, mild prox D2 disease, 50-60% OM1 (neg FFR), tortuous RCA with mild-moderate mid vessel disease. 2D echo 08/08/16 showed moderate LVH, EF 60-65%, grade 1 DD. Nuclear stress test 08/08/16 showed inferior thinning and mild inferior ischemia, EF 60% with normal wall motion. At his most recent OV with [name], PA his blood pressure was uncontrolled and his coreg was increased. He was also encouraged to reconsider PCSK9i therapy, but declined.   He presents today for blood pressure management. His wife has parkinson and he just retired last year. He has used NTG for bad headache and it seems to help his headaches. He states that he is very careful about what he tells people 'like me because then I will go crazy and order a bunch of stress tests and labs.' He states that he hates taking medications and feels that these medications are what are making him sick. He feels that an increase dose of amlodipine would cause him to feel extremely poorly. He also thinks higher dose of imdur is contributing to feeling poorly as well.  Current HTN meds:  Amlodipine 5mg  daily Carvedilol 25mg  BID Imdur 90mg  daily   Previously tried:   BP goal: <130/80  Family History: brother with stroke and aneurysm  Social History: denies tobacco. Drinks 12 cans of beer per week  Diet: He seldom adds salt to his food. He drinks mostly water or decaf tea.   Exercise: he is limited due to back pain/scoliosis.     Home BP readings: 140/90 -   Wt Readings from Last 3 Encounters:  09/25/17 195 lb (88.5 kg)  09/03/16 192 lb (87.1 kg)  08/28/16 192 lb 12.8 oz (87.5 kg)   BP Readings from Last 3 Encounters:  01/15/18 (!) 142/108  09/25/17 (!) 144/102  09/03/16 123/89   Pulse Readings from Last 3 Encounters:  01/15/18 82  09/25/17 90  09/03/16 87    Renal function: CrCl cannot be calculated (Patient's most recent lab result is older than the maximum 21 days allowed.).  Past Medical History:  Diagnosis Date  . Anxiety   . Arthritis   . Chronic stable angina ([place])   . Claudication ([place]) 07/20/2015  . Colon polyps 02/05/2010  . Complication of anesthesia    16 yrs ago following hip replacement states took a long time for full memory to return  . Coronary artery disease    a. s/p prior stenting in LAD 03/2013. b. Last LHC in 11/2013 showed patent LAD stents, 80% prox D1, mild prox D2 disease, 50-60% OM1 (neg FFR), tortuous RCA with mild-moderate mid vessel disease. He recently noticed increased dyspnea with normal activities. 2D echo 08/08/16 showed moderate LVH, EF 60-65%, grade 1 DD. Nuclear stress test 08/08/16 showed inferior thinning and mild inferior ischemia, EF   . Depression   .  Hypercholesteremia   . Hypertension   . Myocardial infarction ([place])   . OSA (obstructive sleep apnea) 01/12/2016   Moderate with AHI 19/hr now on CPAP at 15cm H2O    Current Outpatient Medications on File Prior to Visit  Medication Sig Dispense Refill  . amLODipine (NORVASC) 5 MG tablet Take 1 tablet (5 mg total) by mouth daily. 90 tablet 3  . aspirin EC 81 MG tablet Take 1 tablet (81 mg total) by mouth daily.    Marland Kitchen aspirin-acetaminophen-caffeine (EXCEDRIN MIGRAINE) 250-250-65 MG tablet Take 1 tablet by mouth every 6 (six) hours as needed (for severe headache pain.).    Marland Kitchen carvedilol (COREG) 25 MG tablet Take 1 tablet (25 mg total) by mouth 2 (two) times daily. 180 tablet 3  . clopidogrel (PLAVIX) 75 MG tablet  Take 1 tablet (75 mg total) by mouth daily. 90 tablet 3  . DULoxetine (CYMBALTA) 60 MG capsule Take 60 mg by mouth daily.  5  . ezetimibe (ZETIA) 10 MG tablet Take 1 tablet (10 mg total) by mouth daily. 90 tablet 3  . isosorbide mononitrate (IMDUR) 60 MG 24 hr tablet Take 1.5 tablets (90 mg total) by mouth daily. 135 tablet 3  . nitroGLYCERIN (NITROSTAT) 0.4 MG SL tablet PLACE 1 TABLET (0.4 MG TOTAL) UNDER THE TONGUE EVERY 5 (FIVE) MINUTES AS NEEDED FOR CHEST PAIN. 75 tablet 1  . tetrahydrozoline-zinc (VISINE-AC) 0.05-0.25 % ophthalmic solution Place 2 drops into both eyes 3 (three) times daily as needed (redness).     . traMADol (ULTRAM) 50 MG tablet Ultram 50 mg tablet  Take 1 tablet 3 times a day by oral route as needed.     Current Facility-Administered Medications on File Prior to Visit  Medication Dose Route Frequency Provider Last Rate Last Dose  . 0.9 %  sodium chloride infusion  500 mL Intravenous Continuous Ladene Artist, MD        No Known Allergies  Blood pressure (!) 142/108, pulse 82.   Assessment/Plan: Hypertension: Discussed need for aggressive blood pressure control as pressure is markedly elevated in the office today. He has not taken his carvedilol this morning which is likely contributing, but home pressures are above goal as well. Will adjust Imdur to see if this will allow him to feel better. Will continue amlodipine and carvedilol at current doses as pt does not wish to increase amlodipine. Will start valsartan 80mg  once daily for added blood pressure benefit. Pt would like to delay follow up until after holiday season. Will follow up in 2 months for additional medication adjustment.    Thank you, [name]. Patterson Hammersmith, [facility]  01/16/2018 2:13 PM

## 2018-01-15 NOTE — Patient Instructions (Addendum)
Return for a follow up appointment in 4-6 weeks  Check your blood pressure at home daily (if able) and keep record of the readings.  Take your BP meds as follows: Decrease Imdur (isosorbide) to 60mg  (1 tablet) once daily  START valsartan 80mg  ONCE daily    Bring all of your meds, your BP cuff and your record of home blood pressures to your next appointment.  Exercise as you're able, try to walk approximately 30 minutes per day.  Keep salt intake to a minimum, especially watch canned and prepared boxed foods.  Eat more fresh fruits and vegetables and fewer canned items.  Avoid eating in fast food restaurants.    HOW TO TAKE YOUR BLOOD PRESSURE: . Rest 5 minutes before taking your blood pressure. .  Don't smoke or drink caffeinated beverages for at least 30 minutes before. . Take your blood pressure before (not after) you eat. . Sit comfortably with your back supported and both feet on the floor (don't cross your legs). . Elevate your arm to heart level on a table or a desk. . Use the proper sized cuff. It should fit smoothly and snugly around your bare upper arm. There should be enough room to slip a fingertip under the cuff. The bottom edge of the cuff should be 1 inch above the crease of the elbow. . Ideally, take 3 measurements at one sitting and record the average.

## 2018-01-16 ENCOUNTER — Encounter: Payer: Self-pay | Admitting: Pharmacist

## 2018-01-22 DIAGNOSIS — M5416 Radiculopathy, lumbar region: Secondary | ICD-10-CM | POA: Diagnosis not present

## 2018-02-18 DIAGNOSIS — M5416 Radiculopathy, lumbar region: Secondary | ICD-10-CM | POA: Diagnosis not present

## 2018-02-23 DIAGNOSIS — J209 Acute bronchitis, unspecified: Secondary | ICD-10-CM | POA: Diagnosis not present

## 2018-03-04 DIAGNOSIS — J45909 Unspecified asthma, uncomplicated: Secondary | ICD-10-CM | POA: Diagnosis not present

## 2018-03-11 ENCOUNTER — Other Ambulatory Visit: Payer: Self-pay

## 2018-03-11 ENCOUNTER — Emergency Department (HOSPITAL_BASED_OUTPATIENT_CLINIC_OR_DEPARTMENT_OTHER): Payer: Medicare Other

## 2018-03-11 ENCOUNTER — Encounter (HOSPITAL_BASED_OUTPATIENT_CLINIC_OR_DEPARTMENT_OTHER): Payer: Self-pay | Admitting: Emergency Medicine

## 2018-03-11 ENCOUNTER — Emergency Department (HOSPITAL_BASED_OUTPATIENT_CLINIC_OR_DEPARTMENT_OTHER)
Admission: EM | Admit: 2018-03-11 | Discharge: 2018-03-11 | Disposition: A | Discharge status: Home / Self Care | Payer: Medicare Other | Attending: Emergency Medicine | Admitting: Emergency Medicine

## 2018-03-11 DIAGNOSIS — N289 Disorder of kidney and ureter, unspecified: Secondary | ICD-10-CM | POA: Diagnosis not present

## 2018-03-11 DIAGNOSIS — F419 Anxiety disorder, unspecified: Secondary | ICD-10-CM | POA: Insufficient documentation

## 2018-03-11 DIAGNOSIS — Z7902 Long term (current) use of antithrombotics/antiplatelets: Secondary | ICD-10-CM | POA: Diagnosis not present

## 2018-03-11 DIAGNOSIS — Z79899 Other long term (current) drug therapy: Secondary | ICD-10-CM | POA: Insufficient documentation

## 2018-03-11 DIAGNOSIS — I2510 Atherosclerotic heart disease of native coronary artery without angina pectoris: Secondary | ICD-10-CM | POA: Insufficient documentation

## 2018-03-11 DIAGNOSIS — J209 Acute bronchitis, unspecified: Secondary | ICD-10-CM | POA: Diagnosis not present

## 2018-03-11 DIAGNOSIS — Z7982 Long term (current) use of aspirin: Secondary | ICD-10-CM | POA: Insufficient documentation

## 2018-03-11 DIAGNOSIS — Z96643 Presence of artificial hip joint, bilateral: Secondary | ICD-10-CM | POA: Insufficient documentation

## 2018-03-11 DIAGNOSIS — I110 Hypertensive heart disease with heart failure: Secondary | ICD-10-CM | POA: Diagnosis not present

## 2018-03-11 DIAGNOSIS — R062 Wheezing: Secondary | ICD-10-CM | POA: Diagnosis not present

## 2018-03-11 DIAGNOSIS — I5032 Chronic diastolic (congestive) heart failure: Secondary | ICD-10-CM | POA: Insufficient documentation

## 2018-03-11 DIAGNOSIS — Z955 Presence of coronary angioplasty implant and graft: Secondary | ICD-10-CM | POA: Diagnosis not present

## 2018-03-11 DIAGNOSIS — F329 Major depressive disorder, single episode, unspecified: Secondary | ICD-10-CM | POA: Insufficient documentation

## 2018-03-11 DIAGNOSIS — I252 Old myocardial infarction: Secondary | ICD-10-CM | POA: Insufficient documentation

## 2018-03-11 DIAGNOSIS — R05 Cough: Secondary | ICD-10-CM | POA: Diagnosis not present

## 2018-03-11 DIAGNOSIS — R0602 Shortness of breath: Secondary | ICD-10-CM | POA: Diagnosis not present

## 2018-03-11 LAB — CBC WITH DIFFERENTIAL/PLATELET
Abs Immature Granulocytes: 0.02 10*3/uL (ref 0.00–0.07)
Basophils Absolute: 0.1 10*3/uL (ref 0.0–0.1)
Basophils Relative: 1 %
EOS PCT: 12 %
Eosinophils Absolute: 1.1 10*3/uL — ABNORMAL HIGH (ref 0.0–0.5)
HEMATOCRIT: 44.9 % (ref 39.0–52.0)
Hemoglobin: 13.9 g/dL (ref 13.0–17.0)
Immature Granulocytes: 0 %
Lymphocytes Relative: 18 %
Lymphs Abs: 1.7 10*3/uL (ref 0.7–4.0)
MCH: 28 pg (ref 26.0–34.0)
MCHC: 31 g/dL (ref 30.0–36.0)
MCV: 90.3 fL (ref 80.0–100.0)
Monocytes Absolute: 1 10*3/uL (ref 0.1–1.0)
Monocytes Relative: 11 %
Neutro Abs: 5.4 10*3/uL (ref 1.7–7.7)
Neutrophils Relative %: 58 %
Platelets: 271 10*3/uL (ref 150–400)
RBC: 4.97 MIL/uL (ref 4.22–5.81)
RDW: 14.2 % (ref 11.5–15.5)
WBC: 9.2 10*3/uL (ref 4.0–10.5)
nRBC: 0 % (ref 0.0–0.2)

## 2018-03-11 LAB — BASIC METABOLIC PANEL
Anion gap: 5 (ref 5–15)
BUN: 15 mg/dL (ref 8–23)
CALCIUM: 9.1 mg/dL (ref 8.9–10.3)
CO2: 26 mmol/L (ref 22–32)
CREATININE: 1.33 mg/dL — AB (ref 0.61–1.24)
Chloride: 103 mmol/L (ref 98–111)
GFR calc non Af Amer: 55 mL/min — ABNORMAL LOW (ref >60)
Glucose, Bld: 126 mg/dL — ABNORMAL HIGH (ref 70–99)
Potassium: 5.3 mmol/L — ABNORMAL HIGH (ref 3.5–5.1)
Sodium: 134 mmol/L — ABNORMAL LOW (ref 135–145)

## 2018-03-11 LAB — BRAIN NATRIURETIC PEPTIDE: B Natriuretic Peptide: 63.7 pg/mL (ref 0.0–100.0)

## 2018-03-11 MED ORDER — IPRATROPIUM-ALBUTEROL 0.5-2.5 (3) MG/3ML IN SOLN
3.0000 mL | Freq: Four times a day (QID) | RESPIRATORY_TRACT | Status: DC
  Administered 2018-03-11: 3 mL via RESPIRATORY_TRACT
  Filled 2018-03-11: qty 3

## 2018-03-11 MED ORDER — ALBUTEROL SULFATE (2.5 MG/3ML) 0.083% IN NEBU
2.5000 mg | INHALATION_SOLUTION | Freq: Once | RESPIRATORY_TRACT | Status: AC
  Administered 2018-03-11: 2.5 mg via RESPIRATORY_TRACT
  Filled 2018-03-11: qty 3

## 2018-03-11 MED ORDER — IPRATROPIUM-ALBUTEROL 0.5-2.5 (3) MG/3ML IN SOLN
3.0000 mL | Freq: Once | RESPIRATORY_TRACT | Status: AC
  Administered 2018-03-11: 3 mL via RESPIRATORY_TRACT
  Filled 2018-03-11: qty 3

## 2018-03-11 NOTE — ED Provider Notes (Addendum)
[facility] EMERGENCY DEPARTMENT Provider Note   CSN: [id number] Arrival date & time: 03/11/18  1254     History   Chief Complaint Chief Complaint  Patient presents with  . Shortness of Breath    HPI Larry Arroyo is a 67 y.o. male.  HPI Complains of cough and shortness of breath and wheezing onset 4 weeks ago.  Became worse last night.  He has been treated with prednisone earlier this month as well as cefdinir which was started 03/05/2018 after he completed prednisone course he also has been treated with an albuterol inhaler, without relief.  He states his breathing became worse last night.  He denies fever denies chest pain.  No other associated symptoms Past Medical History:  Diagnosis Date  . Anxiety   . Arthritis   . Chronic stable angina ([place])   . Claudication ([place]) 07/20/2015  . Colon polyps 02/05/2010  . Complication of anesthesia    16 yrs ago following hip replacement states took a long time for full memory to return  . Coronary artery disease    a. s/p prior stenting in LAD 03/2013. b. Last LHC in 11/2013 showed patent LAD stents, 80% prox D1, mild prox D2 disease, 50-60% OM1 (neg FFR), tortuous RCA with mild-moderate mid vessel disease. He recently noticed increased dyspnea with normal activities. 2D echo 08/08/16 showed moderate LVH, EF 60-65%, grade 1 DD. Nuclear stress test 08/08/16 showed inferior thinning and mild inferior ischemia, EF   . Depression   . Hypercholesteremia   . Hypertension   . Myocardial infarction ([place])   . OSA (obstructive sleep apnea) 01/12/2016   Moderate with AHI 19/hr now on CPAP at 15cm H2O  CHF  Patient Active Problem List   Diagnosis Date Noted  . Abnormal nuclear stress test   . Exertional dyspnea   . OSA (obstructive sleep apnea) 01/12/2016  . Fatigue 01/12/2016  . Claudication ([place]) 07/20/2015  . Dizziness 12/01/2013  . Chronic diastolic CHF (congestive heart failure) ([place]) 12/01/2013  . Hypersomnia 03/24/2013  .  Coronary artery disease involving native coronary artery with angina pectoris ([place])   . Hypertension   . Hypercholesteremia   . Hyponatremia 02/12/2011  . Osteoarthritis of right hip 02/12/2011    Past Surgical History:  Procedure Laterality Date  . CARDIAC CATHETERIZATION     nonobstructive ASCAD of the RCA of 40-50%, 60% D1, and 70% inferior branch of D1  . CORONARY ANGIOPLASTY WITH STENT PLACEMENT  01/272015   FFR and 3.0 x 38 mm [place] DES to the LAD  . LEFT HEART CATH AND CORONARY ANGIOGRAPHY N/A 09/03/2016   Procedure: Left Heart Cath and Coronary Angiography;  Surgeon: Troy Sine, MD;  Location: [facility];  Service: Cardiovascular;  Laterality: N/A;  . LEFT HEART CATHETERIZATION WITH CORONARY ANGIOGRAM N/A 04/13/2013   Procedure: LEFT HEART CATHETERIZATION WITH CORONARY ANGIOGRAM;  Surgeon: Sueanne Margarita, MD;  Location: [facility] CATH LAB;  Service: Cardiovascular;  Laterality: N/A;  . LEFT HEART CATHETERIZATION WITH CORONARY ANGIOGRAM N/A 05/12/2013   Procedure: LEFT HEART CATHETERIZATION WITH CORONARY ANGIOGRAM;  Surgeon: Burnell Blanks, MD;  Location: [facility] CATH LAB;  Service: Cardiovascular;  Laterality: N/A;  . LEFT HEART CATHETERIZATION WITH CORONARY ANGIOGRAM N/A 12/03/2013   Procedure: LEFT HEART CATHETERIZATION WITH CORONARY ANGIOGRAM;  Surgeon: Jettie Booze, MD;  Location: [facility] CATH LAB;  Service: Cardiovascular;  Laterality: N/A;  . TOTAL HIP ARTHROPLASTY  02/11/2011   Procedure: TOTAL HIP ARTHROPLASTY;  Surgeon: Dione Plover  Aluisio;  Location: [facility] OR[facility];  Service: Orthopedics;  Laterality: Right;  . TOTAL HIP ARTHROPLASTY Left         Home Medications    Prior to Admission medications   Medication Sig Start Date End Date Taking? Authorizing Provider  amLODipine (NORVASC) 5 MG tablet Take 1 tablet (5 mg total) by mouth daily. 09/25/17   Consuelo Pandy, PA-C  aspirin EC 81 MG tablet Take 1 tablet (81 mg total) by mouth daily. 03/24/13   Sueanne Margarita, MD   aspirin-acetaminophen-caffeine (EXCEDRIN MIGRAINE) [phone] MG tablet Take 1 tablet by mouth every 6 (six) hours as needed (for severe headache pain.).    [provider]  carvedilol (COREG) 25 MG tablet Take 1 tablet (25 mg total) by mouth 2 (two) times daily. 09/25/17 01/15/18  Consuelo Pandy, PA-C  clopidogrel (PLAVIX) 75 MG tablet Take 1 tablet (75 mg total) by mouth daily. 09/25/17   Lyda Jester M, PA-C  DULoxetine (CYMBALTA) 60 MG capsule Take 60 mg by mouth daily. 01/04/16   [provider]  ezetimibe (ZETIA) 10 MG tablet Take 1 tablet (10 mg total) by mouth daily. 09/25/17 01/15/18  Lyda Jester M, PA-C  isosorbide mononitrate (IMDUR) 60 MG 24 hr tablet Take 1.5 tablets (90 mg total) by mouth daily. 09/25/17   Lyda Jester M, PA-C  nitroGLYCERIN (NITROSTAT) 0.4 MG SL tablet PLACE 1 TABLET (0.4 MG TOTAL) UNDER THE TONGUE EVERY 5 (FIVE) MINUTES AS NEEDED FOR CHEST PAIN. 12/22/17   Sueanne Margarita, MD  tetrahydrozoline-zinc (VISINE-AC) 0.05-0.25 % ophthalmic solution Place 2 drops into both eyes 3 (three) times daily as needed (redness).     [provider]  traMADol (ULTRAM) 50 MG tablet Ultram 50 mg tablet  Take 1 tablet 3 times a day by oral route as needed.    [provider]  valsartan (DIOVAN) 80 MG tablet Take 1 tablet (80 mg total) by mouth daily. 01/15/18   Sueanne Margarita, MD    Family History Family History  Problem Relation Age of Onset  . Epilepsy Brother 54  . Stroke Brother   . Aneurysm Brother        brain  . Heart attack Neg Hx   . Colon cancer Neg Hx   . Esophageal cancer Neg Hx   . Stomach cancer Neg Hx   . Pancreatic cancer Neg Hx   . Colon polyps Neg Hx     Social History Social History   Tobacco Use  . Smoking status: Never Smoker  . Smokeless tobacco: Never Used  Substance Use Topics  . Alcohol use: Yes    Alcohol/week: 12.0 standard drinks    Types: 12 Cans of beer per week    Comment: 2-3  beers /day  . Drug use: No     Allergies   Patient has no known allergies.   Review of Systems Review of Systems  Constitutional: Negative.   HENT: Negative.   Respiratory: Positive for cough, shortness of breath and wheezing.   Cardiovascular: Negative.   Gastrointestinal: Negative.   Musculoskeletal: Negative.   Skin: Negative.   Neurological: Negative.   Psychiatric/Behavioral: Negative.   All other systems reviewed and are negative.    Physical Exam Updated Vital Signs BP 120/90   Pulse (!) 105   Temp 98 F (36.7 C) (Oral)   Resp (!) 21   SpO2 95%  Patient examined after nebulized treatment Physical Exam Vitals signs and nursing note reviewed.  Constitutional:  Appearance: He is well-developed.  HENT:     Head: Normocephalic and atraumatic.  Eyes:     Conjunctiva/sclera: Conjunctivae normal.     Pupils: Pupils are equal, round, and reactive to light.  Neck:     Musculoskeletal: Neck supple.     Thyroid: No thyromegaly.     Trachea: No tracheal deviation.     Comments: No JVD Cardiovascular:     Rate and Rhythm: Normal rate and regular rhythm.     Heart sounds: No murmur.  Pulmonary:     Effort: Pulmonary effort is normal.     Comments: Speaks in paragraphs.  No respiratory distress.  Diffuse rhonchi and wheezes Abdominal:     General: Bowel sounds are normal. There is no distension.     Palpations: Abdomen is soft.     Tenderness: There is no abdominal tenderness.  Musculoskeletal: Normal range of motion.        General: No swelling or tenderness.  Skin:    General: Skin is warm and dry.     Findings: No rash.  Neurological:     Mental Status: He is alert.     Coordination: Coordination normal.     Results for orders placed or performed during the hospital encounter of 80/99/83  Basic metabolic panel  Result Value Ref Range   Sodium 134 (L) 135 - 145 mmol/L   Potassium 5.3 (H) 3.5 - 5.1 mmol/L   Chloride 103 98 - 111 mmol/L   CO2 26 22  - 32 mmol/L   Glucose, Bld 126 (H) 70 - 99 mg/dL   BUN 15 8 - 23 mg/dL   Creatinine, Ser 1.33 (H) 0.61 - 1.24 mg/dL   Calcium 9.1 8.9 - 10.3 mg/dL   GFR calc non Af Amer 55 (L) >60 mL/min   GFR calc Af Amer >60 >60 mL/min   Anion gap 5 5 - 15  CBC with Differential/Platelet  Result Value Ref Range   WBC 9.2 4.0 - 10.5 K/uL   RBC 4.97 4.22 - 5.81 MIL/uL   Hemoglobin 13.9 13.0 - 17.0 g/dL   HCT 44.9 39.0 - 52.0 %   MCV 90.3 80.0 - 100.0 fL   MCH 28.0 26.0 - 34.0 pg   MCHC 31.0 30.0 - 36.0 g/dL   RDW 14.2 11.5 - 15.5 %   Platelets 271 150 - 400 K/uL   nRBC 0.0 0.0 - 0.2 %   Neutrophils Relative % 58 %   Neutro Abs 5.4 1.7 - 7.7 K/uL   Lymphocytes Relative 18 %   Lymphs Abs 1.7 0.7 - 4.0 K/uL   Monocytes Relative 11 %   Monocytes Absolute 1.0 0.1 - 1.0 K/uL   Eosinophils Relative 12 %   Eosinophils Absolute 1.1 (H) 0.0 - 0.5 K/uL   Basophils Relative 1 %   Basophils Absolute 0.1 0.0 - 0.1 K/uL   Immature Granulocytes 0 %   Abs Immature Granulocytes 0.02 0.00 - 0.07 K/uL  Brain natriuretic peptide  Result Value Ref Range   B Natriuretic Peptide 63.7 0.0 - 100.0 pg/mL   Dg Chest 2 View  Result Date: 03/11/2018 CLINICAL DATA:  Shortness of breath with productive cough 2 weeks. EXAM: CHEST - 2 VIEW COMPARISON:  10/16/2016 and 12/01/2013 FINDINGS: Lungs are adequately inflated without focal airspace consolidation, effusion or pneumothorax. Subtle prominence of the perihilar markings unchanged. Cardiomediastinal silhouette and remainder of the exam is unchanged. IMPRESSION: No active cardiopulmonary disease. Electronically Signed   By: Marin Olp M.D.  On: 03/11/2018 14:39   ED Treatments / Results  Labs (all labs ordered are listed, but only abnormal results are displayed) Labs Reviewed  BASIC METABOLIC PANEL  CBC WITH DIFFERENTIAL/PLATELET  BRAIN NATRIURETIC PEPTIDE    EKG None  Radiology No results found.  Procedures Procedures (including critical care  time)  Medications Ordered in ED Medications  ipratropium-albuterol (DUONEB) 0.5-2.5 (3) MG/3ML nebulizer solution 3 mL (3 mLs Nebulization Given 03/11/18 1311)  albuterol (PROVENTIL) (2.5 MG/3ML) 0.083% nebulizer solution 2.5 mg (2.5 mg Nebulization Given 03/11/18 1311)   1:45 PM reports breathing improved after DuoNeb .second DuoNeb ordered Chest x-ray viewed by me. Initial Impression / Assessment and Plan / ED Course  I have reviewed the triage vital signs and the nursing notes.  Pertinent labs & imaging results that were available during my care of the patient were reviewed by me and considered in my medical decision making (see chart for details).     4 PM after 2 DuoNeb treatments patient states his breathing is much improved.  Almost at baseline.  He is able to ambulate without dyspnea lung exam with expiratory wheezes.  No respiratory distress.  Speaks in paragraphs  Lab work consistent with mild renal insufficiency. No evidence of congestive heart failure  Plan ; given a spacer to use with his albuterol HFA.  Instructed to use 2 puffs every 4 hours as needed for shortness of breath.  Return if needed more than every 4 hours.  Follow-up with PMD within the next 2 weeks to get renal function rechecked. Final Clinical Impressions(s) / ED Diagnoses  Diagnosis #1 acute bronchitis #2 mild renal insufficiency Final diagnoses:  None    ED Discharge Orders    None       Orlie Dakin, MD 03/11/18 1611    Orlie Dakin, MD 03/11/18 [phone]

## 2018-03-11 NOTE — ED Notes (Signed)
Ambulated patient in the hallway.  Tolerated well. Denies shortness of breath or dizziness.  O2 sat=95% in RA; HR 110

## 2018-03-11 NOTE — ED Notes (Addendum)
Pt states he has been coughing and sob x 2 weeks. Was prescribed antibiotics on 12/19 and he states he doesn't feel like the medication is working. Pt denies pain, nausea or vomiting. Denies chest pain or fever, states he feels like "theres a tickle" when he breathes. Pt has history cardiac history-he states he has 2 stents.

## 2018-03-11 NOTE — ED Notes (Signed)
ED Provider at bedside. 

## 2018-03-11 NOTE — ED Triage Notes (Signed)
Wheezing and SOB for a couple weeks that is getting worse.  Taking abx -Cefdinir - for Bronchitis.

## 2018-03-11 NOTE — Discharge Instructions (Addendum)
Use your albuterol inhaler with spacer 2 puffs every 4 hours as needed for shortness of breath.  Return if needed more than every 4 hours.  Lab work shows mild dysfunction of your kidney (renal insufficiency).  Blood creatinine level is 1.33.  Call your primary care physician tomorrow to arrange to get your blood creatinine level rechecked within the next 2 weeks.

## 2018-03-13 DIAGNOSIS — R062 Wheezing: Secondary | ICD-10-CM | POA: Diagnosis not present

## 2018-03-13 DIAGNOSIS — R05 Cough: Secondary | ICD-10-CM | POA: Diagnosis not present

## 2018-03-24 ENCOUNTER — Ambulatory Visit: Payer: Medicare Other

## 2018-04-02 DIAGNOSIS — R05 Cough: Secondary | ICD-10-CM | POA: Diagnosis not present

## 2018-04-02 DIAGNOSIS — N289 Disorder of kidney and ureter, unspecified: Secondary | ICD-10-CM | POA: Diagnosis not present

## 2018-04-02 DIAGNOSIS — R0989 Other specified symptoms and signs involving the circulatory and respiratory systems: Secondary | ICD-10-CM | POA: Diagnosis not present

## 2018-04-02 DIAGNOSIS — R062 Wheezing: Secondary | ICD-10-CM | POA: Diagnosis not present

## 2018-04-22 ENCOUNTER — Ambulatory Visit: Payer: Medicare Other

## 2018-05-19 DIAGNOSIS — M5416 Radiculopathy, lumbar region: Secondary | ICD-10-CM | POA: Diagnosis not present

## 2018-05-19 DIAGNOSIS — R200 Anesthesia of skin: Secondary | ICD-10-CM | POA: Diagnosis not present

## 2018-05-21 ENCOUNTER — Ambulatory Visit: Payer: Medicare Other

## 2018-06-01 ENCOUNTER — Ambulatory Visit: Payer: Medicare Other

## 2018-06-01 DIAGNOSIS — M5416 Radiculopathy, lumbar region: Secondary | ICD-10-CM | POA: Diagnosis not present

## 2018-06-16 ENCOUNTER — Ambulatory Visit: Payer: Medicare Other

## 2018-06-24 ENCOUNTER — Telehealth: Payer: Self-pay | Admitting: Pharmacist

## 2018-06-24 NOTE — Telephone Encounter (Signed)
Patient ID: DEMICHAEL TRAUM                 DOB: 06-28-1950                      MRN: [id number]     HPI: Larry Arroyo is a 68 y.o. male patient of Dr. Radford Pax last seen in the HTN clinic in October. PMH significant for CAD (s/p prior stenting in LAD 09/8293),[id number], diastolic dysfunction, HTN, HL (statin intolerance), OSAnoncompliant with CPAP,who presents forhis yearly f/u.LHC in 11/2013 showed patent LAD stents, 80% prox D1, mild prox D2 disease, 50-60% OM1 (neg FFR), tortuous RCA with mild-moderate mid vessel disease. 2D echo 08/08/16 showed moderate LVH, EF 60-65%, grade 1 DD. Nuclear stress test 08/08/16 showed inferior thinning and mild inferior ischemia, EF 60% with normal wall motion. At his most recent HTN clinic appointment his imdur was decreased to 60mg  due to side effects and he was started on valsartan. Patient declined increasing amlodipine dose. He was also encouraged to reconsider PCSK9i therapy, but declined.   Phone visit is done today due to the [place] pandemic. Patient states that his blood pressure is going much better. He does states that he has had some dizziness/sluggish/orthostatics. Believes it is from the Imdur. He has been cutting it in half and splitting up th doses which he thinks has helped.   He continues to complain that he doesn't like the way medications make him feel. He denies any chest pain. States that he has back pain and has been getting injections, but after the last one he had his pain came back in < a week. Says he was told he needs surgery but wants a second opinion. He asked me if he would qualify for surgery with his heart issues/medications.Informed patient that I am not qualified to answer that question, but that he would need to get clearance from Dr. Radford Pax before having surgery.  Current HTN meds: Imdur 30mg  twice a day, amlodipine 5mg  daily, carvedilol 25mg  twice a day, valsartan 80mg  daily Previously tried:  BP goal: <130/80  Family  History:brother with stroke and aneurysm   Social History: denies tobacco. Drinks 12 cans of beer per week  Diet: He seldom adds salt to his food. He drinks mostly water or decaf tea.   Exercise: he is limited due to back pain/scoliosis.   Home BP readings: 115-120/80-90 (DBP never less than 80)  Wt Readings from Last 3 Encounters:  09/25/17 195 lb (88.5 kg)  09/03/16 192 lb (87.1 kg)  08/28/16 192 lb 12.8 oz (87.5 kg)   BP Readings from Last 3 Encounters:  03/11/18 (!) 136/97  01/15/18 (!) 142/108  09/25/17 (!) 144/102   Pulse Readings from Last 3 Encounters:  03/11/18 94  01/15/18 82  09/25/17 90    Renal function: CrCl cannot be calculated (Patient's most recent lab result is older than the maximum 21 days allowed.).  Past Medical History:  Diagnosis Date  . Anxiety   . Arthritis   . Chronic stable angina ([place])   . Claudication ([place]) 07/20/2015  . Colon polyps 02/05/2010  . Complication of anesthesia    16 yrs ago following hip replacement states took a long time for full memory to return  . Coronary artery disease    a. s/p prior stenting in LAD 03/2013. b. Last LHC in 11/2013 showed patent LAD stents, 80% prox D1, mild prox D2 disease, 50-60% OM1 (neg FFR), tortuous RCA with mild-moderate mid  vessel disease. He recently noticed increased dyspnea with normal activities. 2D echo 08/08/16 showed moderate LVH, EF 60-65%, grade 1 DD. Nuclear stress test 08/08/16 showed inferior thinning and mild inferior ischemia, EF   . Depression   . Hypercholesteremia   . Hypertension   . Myocardial infarction ([place])   . OSA (obstructive sleep apnea) 01/12/2016   Moderate with AHI 19/hr now on CPAP at 15cm H2O    Current Outpatient Medications on File Prior to Visit  Medication Sig Dispense Refill  . amLODipine (NORVASC) 5 MG tablet Take 1 tablet (5 mg total) by mouth daily. 90 tablet 3  . aspirin EC 81 MG tablet Take 1 tablet (81 mg total) by mouth daily.    Marland Kitchen  aspirin-acetaminophen-caffeine (EXCEDRIN MIGRAINE) 250-250-65 MG tablet Take 1 tablet by mouth every 6 (six) hours as needed (for severe headache pain.).    Marland Kitchen carvedilol (COREG) 25 MG tablet Take 1 tablet (25 mg total) by mouth 2 (two) times daily. 180 tablet 3  . clopidogrel (PLAVIX) 75 MG tablet Take 1 tablet (75 mg total) by mouth daily. 90 tablet 3  . DULoxetine (CYMBALTA) 60 MG capsule Take 60 mg by mouth daily.  5  . ezetimibe (ZETIA) 10 MG tablet Take 1 tablet (10 mg total) by mouth daily. 90 tablet 3  . isosorbide mononitrate (IMDUR) 60 MG 24 hr tablet Take 1.5 tablets (90 mg total) by mouth daily. 135 tablet 3  . nitroGLYCERIN (NITROSTAT) 0.4 MG SL tablet PLACE 1 TABLET (0.4 MG TOTAL) UNDER THE TONGUE EVERY 5 (FIVE) MINUTES AS NEEDED FOR CHEST PAIN. 75 tablet 1  . tetrahydrozoline-zinc (VISINE-AC) 0.05-0.25 % ophthalmic solution Place 2 drops into both eyes 3 (three) times daily as needed (redness).     . traMADol (ULTRAM) 50 MG tablet Ultram 50 mg tablet  Take 1 tablet 3 times a day by oral route as needed.    . valsartan (DIOVAN) 80 MG tablet Take 1 tablet (80 mg total) by mouth daily. 90 tablet 1   Current Facility-Administered Medications on File Prior to Visit  Medication Dose Route Frequency Provider Last Rate Last Dose  . 0.9 %  sodium chloride infusion  500 mL Intravenous Continuous Ladene Artist, MD        No Known Allergies   Assessment/Plan:  1. Hypertension - Home blood pressures appear much better than prior, still a little above goal of <130/80. Patient is very hesitant about increasing medications and he complains of dizziness. Since he denies chest pain, I suggested he could try taking only 30mg  (1/2 tablet) of Imdur daily and see how he feels. Continue valsartan 80mg  daily, carvedilol 25mg  twice a day and amlodipine 5mg  daily. Follow up in clinic once able to be seen in office. Patient instructed to call us with any problems/concerns in the interim.   Thank you   Ramond Dial, Pharm.D, [facility]  [address], [place], [place] [address]  Phone: [phone]; Fax: [phone]

## 2018-10-06 ENCOUNTER — Telehealth: Payer: Self-pay

## 2018-10-06 NOTE — Telephone Encounter (Signed)
Unable to lmom for prescreen consatant ringing

## 2018-10-08 ENCOUNTER — Other Ambulatory Visit: Payer: Self-pay

## 2018-10-08 ENCOUNTER — Ambulatory Visit (INDEPENDENT_AMBULATORY_CARE_PROVIDER_SITE_OTHER): Payer: Medicare Other | Admitting: Pharmacist

## 2018-10-08 VITALS — BP 140/70 | HR 82

## 2018-10-08 DIAGNOSIS — I10 Essential (primary) hypertension: Secondary | ICD-10-CM

## 2018-10-08 NOTE — Progress Notes (Signed)
Patient ID: Larry Arroyo                 DOB: 22-May-1950                      MRN: [id number]     HPI: Larry Arroyo is a 68 y.o. male patient of Dr. Radford Pax last seen in the HTN clinic in April via telephone. PMH significant for CAD (s/p prior stenting in LAD 11/1476),[id number], diastolic dysfunction, HTN, HL (statin intolerance), OSAnoncompliant with CPAP.LHC in 11/2013 showed patent LAD stents, 80% prox D1, mild prox D2 disease, 50-60% OM1 (neg FFR), tortuous RCA with mild-moderate mid vessel disease. 2D echo 08/08/16 showed moderate LVH, EF 60-65%, grade 1 DD. Nuclear stress test 08/08/16 showed inferior thinning and mild inferior ischemia, EF 60% with normal wall motion. At his most recent HTN clinic appointment no changes were made as patient blood pressures were close to goal and patient had complained of some dizziness that was resolving.  Of note, patient has declined increasing amlodipine dose in the past. He was also encouraged to reconsider PCSK9i therapy, but declined in the past.   Patient states that he can come to accept that his medications are going to make him feel poorly. States he has tried to take him medications in several different varieties without success. States that after a few hours after taking his medications he feels "odd." States that they make him dizzy. When asked if it was just when he stands up, he says something he is standing already and he feels dizzy. When he takes his time standing up he is fine.  Patient also states that he woke up a day or two ago and his vision in his left eye was blurry. Denies any slurred speech, numbness/weakenss or confusion. Advised patient to call his eye doctor and advised on the warning signs of a stroke and go to the ER if worsens.  Patient states that he took an excedrine this AM and that he really didn't want to be here. States his blood pressure is always high in clinic.  Current HTN meds: Imdur 30mg  twice a day, amlodipine 5mg   daily, carvedilol 25mg  twice a day, valsartan 80mg  daily Previously tried: imdur 60mg  once daily (dizziness/sluggish/orthostatics.) BP goal: <130/80  Family History:brother with stroke and aneurysm   Social History: denies tobacco. Drinks 12 cans of beer per week  Diet: He seldom adds salt to his food. He drinks mostly water or decaf tea.   Exercise: he is limited due to back pain/scoliosis.   Home BP readings: 120-130/80's  Wt Readings from Last 3 Encounters:  09/25/17 195 lb (88.5 kg)  09/03/16 192 lb (87.1 kg)  08/28/16 192 lb 12.8 oz (87.5 kg)   BP Readings from Last 3 Encounters:  03/11/18 (!) 136/97  01/15/18 (!) 142/108  09/25/17 (!) 144/102   Pulse Readings from Last 3 Encounters:  03/11/18 94  01/15/18 82  09/25/17 90    Renal function: CrCl cannot be calculated (Patient's most recent lab result is older than the maximum 21 days allowed.).  Past Medical History:  Diagnosis Date  . Anxiety   . Arthritis   . Chronic stable angina ([place])   . Claudication ([place]) 07/20/2015  . Colon polyps 02/05/2010  . Complication of anesthesia    16 yrs ago following hip replacement states took a long time for full memory to return  . Coronary artery disease    a. s/p prior stenting in  LAD 03/2013. b. Last LHC in 11/2013 showed patent LAD stents, 80% prox D1, mild prox D2 disease, 50-60% OM1 (neg FFR), tortuous RCA with mild-moderate mid vessel disease. He recently noticed increased dyspnea with normal activities. 2D echo 08/08/16 showed moderate LVH, EF 60-65%, grade 1 DD. Nuclear stress test 08/08/16 showed inferior thinning and mild inferior ischemia, EF   . Depression   . Hypercholesteremia   . Hypertension   . Myocardial infarction ([place])   . OSA (obstructive sleep apnea) 01/12/2016   Moderate with AHI 19/hr now on CPAP at 15cm H2O    Current Outpatient Medications on File Prior to Visit  Medication Sig Dispense Refill  . amLODipine (NORVASC) 5 MG tablet Take 1 tablet (5 mg  total) by mouth daily. 90 tablet 3  . aspirin EC 81 MG tablet Take 1 tablet (81 mg total) by mouth daily.    Marland Kitchen aspirin-acetaminophen-caffeine (EXCEDRIN MIGRAINE) 250-250-65 MG tablet Take 1 tablet by mouth every 6 (six) hours as needed (for severe headache pain.).    Marland Kitchen carvedilol (COREG) 25 MG tablet Take 1 tablet (25 mg total) by mouth 2 (two) times daily. 180 tablet 3  . clopidogrel (PLAVIX) 75 MG tablet Take 1 tablet (75 mg total) by mouth daily. 90 tablet 3  . DULoxetine (CYMBALTA) 60 MG capsule Take 60 mg by mouth daily.  5  . ezetimibe (ZETIA) 10 MG tablet Take 1 tablet (10 mg total) by mouth daily. 90 tablet 3  . isosorbide mononitrate (IMDUR) 60 MG 24 hr tablet Take 0.5 tablets (30 mg total) by mouth daily. 135 tablet 3  . nitroGLYCERIN (NITROSTAT) 0.4 MG SL tablet PLACE 1 TABLET (0.4 MG TOTAL) UNDER THE TONGUE EVERY 5 (FIVE) MINUTES AS NEEDED FOR CHEST PAIN. 75 tablet 1  . tetrahydrozoline-zinc (VISINE-AC) 0.05-0.25 % ophthalmic solution Place 2 drops into both eyes 3 (three) times daily as needed (redness).     . traMADol (ULTRAM) 50 MG tablet Ultram 50 mg tablet  Take 1 tablet 3 times a day by oral route as needed.    . valsartan (DIOVAN) 80 MG tablet Take 1 tablet (80 mg total) by mouth daily. 90 tablet 1   Current Facility-Administered Medications on File Prior to Visit  Medication Dose Route Frequency Provider Last Rate Last Dose  . 0.9 %  sodium chloride infusion  500 mL Intravenous Continuous Ladene Artist, MD        No Known Allergies   Assessment/Plan:  1. Hypertension - Blood pressure is above goal of <130/80 in clinic today. Per patient report blood pressure is very close to goal at home. Patient also took Excedrine with caffeine this AM. Since patient complains of dizziness (that he can live with per patient) and his home blood pressures appear close to goal, I will not make any changes today. Follow up as needed.   Thank you  Ramond Dial, Pharm.D, [facility]  [address], [place],  [address]  Phone: [phone]; Fax: [phone]

## 2018-10-08 NOTE — Patient Instructions (Addendum)
Continue taking Imdur 30mg  twice a day, amlodipine 5mg  daily, carvedilol 25mg  twice a day and valsartan 80mg  daily  Continue checking your blood pressure at home.   Give [place] a call at [phone] with any questions or concerns

## 2018-12-10 ENCOUNTER — Emergency Department (HOSPITAL_COMMUNITY)
Admission: EM | Admit: 2018-12-10 | Discharge: 2018-12-10 | Disposition: A | Discharge status: Home / Self Care | Payer: Medicare Other | Attending: Emergency Medicine | Admitting: Emergency Medicine

## 2018-12-10 ENCOUNTER — Other Ambulatory Visit: Payer: Self-pay

## 2018-12-10 ENCOUNTER — Encounter (HOSPITAL_COMMUNITY): Payer: Self-pay | Admitting: Emergency Medicine

## 2018-12-10 ENCOUNTER — Emergency Department (HOSPITAL_COMMUNITY): Payer: Medicare Other

## 2018-12-10 DIAGNOSIS — R51 Headache: Secondary | ICD-10-CM | POA: Diagnosis not present

## 2018-12-10 DIAGNOSIS — Z7902 Long term (current) use of antithrombotics/antiplatelets: Secondary | ICD-10-CM | POA: Diagnosis not present

## 2018-12-10 DIAGNOSIS — S0181XA Laceration without foreign body of other part of head, initial encounter: Secondary | ICD-10-CM | POA: Insufficient documentation

## 2018-12-10 DIAGNOSIS — I259 Chronic ischemic heart disease, unspecified: Secondary | ICD-10-CM | POA: Diagnosis not present

## 2018-12-10 DIAGNOSIS — Z7982 Long term (current) use of aspirin: Secondary | ICD-10-CM | POA: Diagnosis not present

## 2018-12-10 DIAGNOSIS — I5032 Chronic diastolic (congestive) heart failure: Secondary | ICD-10-CM | POA: Diagnosis not present

## 2018-12-10 DIAGNOSIS — M542 Cervicalgia: Secondary | ICD-10-CM | POA: Diagnosis not present

## 2018-12-10 DIAGNOSIS — I110 Hypertensive heart disease with heart failure: Secondary | ICD-10-CM | POA: Diagnosis not present

## 2018-12-10 DIAGNOSIS — Y999 Unspecified external cause status: Secondary | ICD-10-CM | POA: Insufficient documentation

## 2018-12-10 DIAGNOSIS — R000 Tachycardia, unspecified: Secondary | ICD-10-CM | POA: Diagnosis not present

## 2018-12-10 DIAGNOSIS — S066X0A Traumatic subarachnoid hemorrhage without loss of consciousness, initial encounter: Secondary | ICD-10-CM | POA: Insufficient documentation

## 2018-12-10 DIAGNOSIS — S0990XA Unspecified injury of head, initial encounter: Secondary | ICD-10-CM | POA: Diagnosis present

## 2018-12-10 DIAGNOSIS — R52 Pain, unspecified: Secondary | ICD-10-CM | POA: Diagnosis not present

## 2018-12-10 DIAGNOSIS — Z79899 Other long term (current) drug therapy: Secondary | ICD-10-CM | POA: Insufficient documentation

## 2018-12-10 DIAGNOSIS — S06360A Traumatic hemorrhage of cerebrum, unspecified, without loss of consciousness, initial encounter: Secondary | ICD-10-CM | POA: Diagnosis not present

## 2018-12-10 DIAGNOSIS — W06XXXA Fall from bed, initial encounter: Secondary | ICD-10-CM | POA: Diagnosis not present

## 2018-12-10 DIAGNOSIS — Y92003 Bedroom of unspecified non-institutional (private) residence as the place of occurrence of the external cause: Secondary | ICD-10-CM | POA: Diagnosis not present

## 2018-12-10 DIAGNOSIS — Y9389 Activity, other specified: Secondary | ICD-10-CM | POA: Insufficient documentation

## 2018-12-10 DIAGNOSIS — R58 Hemorrhage, not elsewhere classified: Secondary | ICD-10-CM | POA: Diagnosis not present

## 2018-12-10 DIAGNOSIS — I609 Nontraumatic subarachnoid hemorrhage, unspecified: Secondary | ICD-10-CM

## 2018-12-10 MED ORDER — FENTANYL CITRATE (PF) 100 MCG/2ML IJ SOLN
75.0000 ug | Freq: Once | INTRAMUSCULAR | Status: AC
  Administered 2018-12-10: 75 ug via INTRAVENOUS
  Filled 2018-12-10: qty 2

## 2018-12-10 MED ORDER — FENTANYL CITRATE (PF) 100 MCG/2ML IJ SOLN
100.0000 ug | Freq: Once | INTRAMUSCULAR | Status: AC
  Administered 2018-12-10: 100 ug via INTRAVENOUS
  Filled 2018-12-10: qty 2

## 2018-12-10 MED ORDER — LIDOCAINE-EPINEPHRINE (PF) 2 %-1:200000 IJ SOLN
20.0000 mL | Freq: Once | INTRAMUSCULAR | Status: AC
  Administered 2018-12-10: 20 mL
  Filled 2018-12-10 (×2): qty 20

## 2018-12-10 NOTE — ED Provider Notes (Signed)
[facility] EMERGENCY DEPARTMENT Provider Note   CSN: [id number] Arrival date & time: 12/10/18  0356     History   Chief Complaint Chief Complaint  Patient presents with  . Fall    HPI BESNIK CHARACTER is a 68 y.o. male with a history of CAD s/p prior stenting in LAD in 123456, diastolic dysfunction, HTN, hyperlipidemia with statin intolerance, OSA noncompliant with CPAP who presents the emergency department by EMS with a chief complaint of fall.   The patient reports that he is a very heavy sleeper and was dreaming.  The next thing he recalls he had fallen out of bed.  His wife reports that he hit his head on a windowsill during the fall.  He states the next thing that he recalls that he woke up in a pool of blood.  He reports that he has a very large wound across the forehead with associated headache.  He denies neck pain, numbness, slurred speech, facial droop, weakness, nausea, vomiting, abdominal pain, chest pain, shortness of breath.   He is on Plavix for CAD.  Per chart review, patient's Tdap was updated in 2019.     The history is provided by the patient. No language interpreter was used.    Past Medical History:  Diagnosis Date  . Anxiety   . Arthritis   . Chronic stable angina ([place])   . Claudication ([place]) 07/20/2015  . Colon polyps 02/05/2010  . Complication of anesthesia    16 yrs ago following hip replacement states took a long time for full memory to return  . Coronary artery disease    a. s/p prior stenting in LAD 03/2013. b. Last LHC in 11/2013 showed patent LAD stents, 80% prox D1, mild prox D2 disease, 50-60% OM1 (neg FFR), tortuous RCA with mild-moderate mid vessel disease. He recently noticed increased dyspnea with normal activities. 2D echo 08/08/16 showed moderate LVH, EF 60-65%, grade 1 DD. Nuclear stress test 08/08/16 showed inferior thinning and mild inferior ischemia, EF   . Depression   . Hypercholesteremia   . Hypertension   . Myocardial  infarction ([place])   . OSA (obstructive sleep apnea) 01/12/2016   Moderate with AHI 19/hr now on CPAP at 15cm H2O    Patient Active Problem List   Diagnosis Date Noted  . Abnormal nuclear stress test   . Exertional dyspnea   . OSA (obstructive sleep apnea) 01/12/2016  . Fatigue 01/12/2016  . Claudication ([place]) 07/20/2015  . Dizziness 12/01/2013  . Chronic diastolic CHF (congestive heart failure) ([place]) 12/01/2013  . Hypersomnia 03/24/2013  . Coronary artery disease involving native coronary artery with angina pectoris ([place])   . Hypertension   . Hypercholesteremia   . Hyponatremia 02/12/2011  . Osteoarthritis of right hip 02/12/2011    Past Surgical History:  Procedure Laterality Date  . CARDIAC CATHETERIZATION     nonobstructive ASCAD of the RCA of 40-50%, 60% D1, and 70% inferior branch of D1  . CORONARY ANGIOPLASTY WITH STENT PLACEMENT  01/272015   FFR and 3.0 x 38 mm [place] DES to the LAD  . LEFT HEART CATH AND CORONARY ANGIOGRAPHY N/A 09/03/2016   Procedure: Left Heart Cath and Coronary Angiography;  Surgeon: Troy Sine, MD;  Location: [facility];  Service: Cardiovascular;  Laterality: N/A;  . LEFT HEART CATHETERIZATION WITH CORONARY ANGIOGRAM N/A 04/13/2013   Procedure: LEFT HEART CATHETERIZATION WITH CORONARY ANGIOGRAM;  Surgeon: Sueanne Margarita, MD;  Location: [facility] CATH LAB;  Service:  Cardiovascular;  Laterality: N/A;  . LEFT HEART CATHETERIZATION WITH CORONARY ANGIOGRAM N/A 05/12/2013   Procedure: LEFT HEART CATHETERIZATION WITH CORONARY ANGIOGRAM;  Surgeon: Burnell Blanks, MD;  Location: [facility] CATH LAB;  Service: Cardiovascular;  Laterality: N/A;  . LEFT HEART CATHETERIZATION WITH CORONARY ANGIOGRAM N/A 12/03/2013   Procedure: LEFT HEART CATHETERIZATION WITH CORONARY ANGIOGRAM;  Surgeon: Jettie Booze, MD;  Location: [facility] CATH LAB;  Service: Cardiovascular;  Laterality: N/A;  . TOTAL HIP ARTHROPLASTY  02/11/2011   Procedure: TOTAL HIP ARTHROPLASTY;  Surgeon:  Dione Plover Aluisio;  Location: [facility];  Service: Orthopedics;  Laterality: Right;  . TOTAL HIP ARTHROPLASTY Left         Home Medications    Prior to Admission medications   Medication Sig Start Date End Date Taking? Authorizing Provider  amLODipine (NORVASC) 5 MG tablet Take 1 tablet (5 mg total) by mouth daily. 09/25/17   Consuelo Pandy, PA-C  aspirin EC 81 MG tablet Take 1 tablet (81 mg total) by mouth daily. 03/24/13   Sueanne Margarita, MD  aspirin-acetaminophen-caffeine (EXCEDRIN MIGRAINE) [phone] MG tablet Take 1 tablet by mouth every 6 (six) hours as needed (for severe headache pain.).    [provider]  carvedilol (COREG) 25 MG tablet Take 1 tablet (25 mg total) by mouth 2 (two) times daily. 09/25/17 01/15/18  Consuelo Pandy, PA-C  clopidogrel (PLAVIX) 75 MG tablet Take 1 tablet (75 mg total) by mouth daily. 09/25/17   Lyda Jester M, PA-C  DULoxetine (CYMBALTA) 60 MG capsule Take 60 mg by mouth daily. 01/04/16   [provider]  ezetimibe (ZETIA) 10 MG tablet Take 1 tablet (10 mg total) by mouth daily. 09/25/17 01/15/18  Lyda Jester M, PA-C  isosorbide mononitrate (IMDUR) 60 MG 24 hr tablet Take 30 mg by mouth daily. 30mg  twice a day 06/24/18   Sueanne Margarita, MD  nitroGLYCERIN (NITROSTAT) 0.4 MG SL tablet PLACE 1 TABLET (0.4 MG TOTAL) UNDER THE TONGUE EVERY 5 (FIVE) MINUTES AS NEEDED FOR CHEST PAIN. 12/22/17   Sueanne Margarita, MD  tetrahydrozoline-zinc (VISINE-AC) 0.05-0.25 % ophthalmic solution Place 2 drops into both eyes 3 (three) times daily as needed (redness).     [provider]  traMADol (ULTRAM) 50 MG tablet Ultram 50 mg tablet  Take 1 tablet 3 times a day by oral route as needed.    [provider]  valsartan (DIOVAN) 80 MG tablet Take 1 tablet (80 mg total) by mouth daily. 01/15/18   Sueanne Margarita, MD    Family History Family History  Problem Relation Age of Onset  . Epilepsy Brother 33  . Stroke Brother   .  Aneurysm Brother        brain  . Heart attack Neg Hx   . Colon cancer Neg Hx   . Esophageal cancer Neg Hx   . Stomach cancer Neg Hx   . Pancreatic cancer Neg Hx   . Colon polyps Neg Hx     Social History Social History   Tobacco Use  . Smoking status: Never Smoker  . Smokeless tobacco: Never Used  Substance Use Topics  . Alcohol use: Yes    Alcohol/week: 12.0 standard drinks    Types: 12 Cans of beer per week    Comment: 2-3 beers /day  . Drug use: No     Allergies   Patient has no known allergies.   Review of Systems Review of Systems  Constitutional: Negative for activity change, appetite  change, chills and fever.  Respiratory: Negative for shortness of breath.   Cardiovascular: Negative for chest pain.  Gastrointestinal: Negative for abdominal pain, blood in stool, diarrhea, nausea and vomiting.  Genitourinary: Negative for dysuria, flank pain, genital sores, penile pain and urgency.  Musculoskeletal: Negative for back pain, gait problem, myalgias, neck pain and neck stiffness.  Skin: Positive for wound. Negative for color change and rash.  Allergic/Immunologic: Negative for immunocompromised state.  Neurological: Positive for headaches. Negative for dizziness, seizures, syncope and weakness.  Psychiatric/Behavioral: Negative for confusion and self-injury.   Physical Exam Updated Vital Signs BP (!) 149/102   Pulse 95   Temp 98.5 F (36.9 C) (Oral)   Resp 15   SpO2 95%   Physical Exam Vitals signs and nursing note reviewed.  Constitutional:      General: He is not in acute distress.    Appearance: He is well-developed. He is not ill-appearing, toxic-appearing or diaphoretic.     Comments: C-collar in place  Eyes:     Extraocular Movements: Extraocular movements intact.     Conjunctiva/sclera: Conjunctivae normal.     Pupils: Pupils are equal, round, and reactive to light.  Neck:     Musculoskeletal: Neck supple.  Cardiovascular:     Rate and Rhythm:  Regular rhythm. Tachycardia present.     Heart sounds: No murmur. No friction rub. No gallop.   Pulmonary:     Effort: Pulmonary effort is normal. No respiratory distress.     Breath sounds: No stridor. No wheezing, rhonchi or rales.  Abdominal:     General: There is no distension.     Palpations: Abdomen is soft. There is no mass.     Tenderness: There is no abdominal tenderness. There is no right CVA tenderness, left CVA tenderness, guarding or rebound.     Hernia: No hernia is present.  Musculoskeletal:     Comments: No tenderness to the cervical, thoracic, or lumbar spinous processes or paraspinal muscle tenderness.  Skin:    General: Skin is warm and dry.     Comments: There is a 6.5 cm L-shaped wound extending across the forehead.  Wound is hemostatic.  No obvious foreign bodies.  Orbicularis oculi muscle is intact.  Neurological:     General: No focal deficit present.     Mental Status: He is alert.     Comments: Cranial nerves II through XII are grossly intact.  5/5 strength against resistance of the upper and lower extremities.  Moves all 4 extremities spontaneously.  5-5 strength against resistance of bilateral upper and lower extremities.  Speaks in complete, fluent sentences.  Follows simple commands.  Gait exam is deferred at this time.  Psychiatric:        Behavior: Behavior normal.       ED Treatments / Results  Labs (all labs ordered are listed, but only abnormal results are displayed) Labs Reviewed - No data to display  EKG None  Radiology Ct Head Wo Contrast  Result Date: 12/10/2018 CLINICAL DATA:  Neck pain after fall from bed EXAM: CT HEAD WITHOUT CONTRAST CT CERVICAL SPINE WITHOUT CONTRAST TECHNIQUE: Multidetector CT imaging of the head and cervical spine was performed following the standard protocol without intravenous contrast. Multiplanar CT image reconstructions of the cervical spine were also generated. COMPARISON:  11/01/2006 FINDINGS: CT HEAD FINDINGS  Brain: Tiny interhemispheric hemorrhage, subarachnoid appearing, convincing based on prior. Mild white matter low-density. No infarct or hydrocephalus. Vascular: Atherosclerotic calcification Skull: Forehead laceration with soft tissue  gas. No opaque foreign body or fracture Sinuses/Orbits: Negative CT CERVICAL SPINE FINDINGS Alignment: No traumatic malalignment. Degenerative reversal of cervical lordosis with C2-3, C7-T1, and T1-2 mild anterolisthesis Skull base and vertebrae: Negative for fracture Soft tissues and spinal canal: No prevertebral fluid or swelling. No visible canal hematoma. Disc levels: Lower cervical degenerative disc narrowing and ridging. Mainly upper and lower cervical degenerative facet spurring. Upper chest: Negative Critical Value/emergent results were called by telephone at the time of interpretation on 12/10/2018 at 5:19 am to [place] , who verbally acknowledged these results. IMPRESSION: 1. Tiny interhemispheric subarachnoid hemorrhage. 2. Forehead laceration without opaque foreign body or fracture. 3. Negative for cervical spine fracture Electronically Signed   By: Monte Fantasia M.D.   On: 12/10/2018 05:20   Ct Cervical Spine Wo Contrast  Result Date: 12/10/2018 CLINICAL DATA:  Neck pain after fall from bed EXAM: CT HEAD WITHOUT CONTRAST CT CERVICAL SPINE WITHOUT CONTRAST TECHNIQUE: Multidetector CT imaging of the head and cervical spine was performed following the standard protocol without intravenous contrast. Multiplanar CT image reconstructions of the cervical spine were also generated. COMPARISON:  11/01/2006 FINDINGS: CT HEAD FINDINGS Brain: Tiny interhemispheric hemorrhage, subarachnoid appearing, convincing based on prior. Mild white matter low-density. No infarct or hydrocephalus. Vascular: Atherosclerotic calcification Skull: Forehead laceration with soft tissue gas. No opaque foreign body or fracture Sinuses/Orbits: Negative CT CERVICAL SPINE FINDINGS  Alignment: No traumatic malalignment. Degenerative reversal of cervical lordosis with C2-3, C7-T1, and T1-2 mild anterolisthesis Skull base and vertebrae: Negative for fracture Soft tissues and spinal canal: No prevertebral fluid or swelling. No visible canal hematoma. Disc levels: Lower cervical degenerative disc narrowing and ridging. Mainly upper and lower cervical degenerative facet spurring. Upper chest: Negative Critical Value/emergent results were called by telephone at the time of interpretation on 12/10/2018 at 5:19 am to [place] , who verbally acknowledged these results. IMPRESSION: 1. Tiny interhemispheric subarachnoid hemorrhage. 2. Forehead laceration without opaque foreign body or fracture. 3. Negative for cervical spine fracture Electronically Signed   By: Monte Fantasia M.D.   On: 12/10/2018 05:20    Procedures .Critical Care Performed by: Joanne Gavel, PA-C Authorized by: Joanne Gavel, PA-C   Critical care provider statement:    Critical care time (minutes):  45   Critical care time was exclusive of:  Separately billable procedures and treating other patients and teaching time   Critical care was necessary to treat or prevent imminent or life-threatening deterioration of the following conditions:  CNS failure or compromise   Critical care was time spent personally by me on the following activities:  Ordering and performing treatments and interventions, ordering and review of laboratory studies, ordering and review of radiographic studies, pulse oximetry, re-evaluation of patient's condition, review of old charts, obtaining history from patient or surrogate, examination of patient, evaluation of patient's response to treatment, development of treatment plan with patient or surrogate and discussions with consultants   I assumed direction of critical care for this patient from another provider in my specialty: no   .Marland KitchenLaceration Repair  Date/Time: 12/10/2018 7:47 AM  Performed by: Joanne Gavel, PA-C Authorized by: Joanne Gavel, PA-C   Consent:    Consent obtained:  Verbal   Consent given by:  Patient   Risks discussed:  Infection, retained foreign body, pain, need for additional repair, poor cosmetic result and poor wound healing   Alternatives discussed:  No treatment Anesthesia (see MAR for exact dosages):    Anesthesia method:  Local  infiltration   Local anesthetic:  Lidocaine 2% WITH epi Laceration details:    Location:  Face   Face location:  Forehead   Length (cm):  6.5 Repair type:    Repair type:  Intermediate Pre-procedure details:    Preparation:  Patient was prepped and draped in usual sterile fashion and imaging obtained to evaluate for foreign bodies Exploration:    Hemostasis achieved with:  Epinephrine and direct pressure   Wound exploration: wound explored through full range of motion and entire depth of wound probed and visualized     Wound extent: no fascia violation noted, no foreign bodies/material noted, no muscle damage noted, no nerve damage noted, no tendon damage noted, no underlying fracture noted and no vascular damage noted     Contaminated: no   Treatment:    Area cleansed with:  Saline   Amount of cleaning:  Extensive   Visualized foreign bodies/material removed: no   Skin repair:    Repair method:  Sutures   Suture size:  5-0   Suture material:  Prolene   Suture technique:  Simple interrupted   Number of sutures:  18 Approximation:    Approximation:  Close Post-procedure details:    Dressing:  Open (no dressing)   Patient tolerance of procedure:  Tolerated well, no immediate complications   (including critical care time)  Medications Ordered in ED Medications  fentaNYL (SUBLIMAZE) injection 75 mcg (has no administration in time range)  fentaNYL (SUBLIMAZE) injection 100 mcg (100 mcg Intravenous Given 12/10/18 0433)  lidocaine-EPINEPHrine (XYLOCAINE W/EPI) 2 %-1:200000 (PF) injection 20 mL (20 mLs  Infiltration Given by Other 12/10/18 [id number])     Initial Impression / Assessment and Plan / ED Course  I have reviewed the triage vital signs and the nursing notes.  Pertinent labs & imaging results that were available during my care of the patient were reviewed by me and considered in my medical decision making (see chart for details).        68 year old male with a history of CAD s/p prior stenting in LAD in 123456, diastolic dysfunction, HTN, hyperlipidemia with statin intolerance, OSA noncompliant with CPAP who presents by EMS after a fall out of bed and hit his head on a nearby windowsill.  Patient is on Plavix.  He is tachycardic in the 120s on arrival that resolves with pain control.  He is minimally hypertensive with systolic pressure in the 0000000.  Patient was seen and until evaluated by Dr. Roxanne Mins, attending physician.  Head CT with a tiny interhemispheric subarachnoid hemorrhage.  Consulted neurosurgery and spoke with Margo Aye, NP, who recommends repeat head CT in 7 hours.  Initially thought fall was at 4 AM and head CT repeated at 11 AM was recommended.  However, the patient's wife reports that the injury occurred at 3 AM.  Repeat head CT has been ordered and timed for 10 AM.  If repeat head CT does not show enlarging subarachnoid hemorrhage, patient can be discharged home and will not need neurosurgery follow-up.  If repeat head CT shows enlarging bleed, neurosurgery will need to be reconsulted.  CT cervical spine is unremarkable.  Pain has been well controlled with fentanyl.  The patient has a large 6.5 cm laceration extending across the forehead.  No foreign bodies when the wound is viewed in a bloodless field at the base of the wound.  Orbicularis ocular muscles intact.  The wound was copiously irrigated and was hemostatic.  Closed with prolene and simple interrupted sutures.  Patient's Tdap was updated in 2019.  Home wound care instructions given.  Patient care transferred to [facility] at the end of my shift. Patient presentation, ED course, and plan of care discussed with review of all pertinent labs and imaging. Please see his/her note for further details regarding further ED course and disposition.   Final Clinical Impressions(s) / ED Diagnoses   Final diagnoses:  Fall from bed, initial encounter  Subarachnoid hemorrhage [facility])  Laceration of forehead, initial encounter    ED Discharge Orders    None       Joanne Gavel, PA-C [phone]    Delora Fuel, MD 0000000 [phone]

## 2018-12-10 NOTE — ED Provider Notes (Deleted)
68 year old male fell at home hitting his head on the windowsill and suffering a laceration to his forehead.  He is not on anticoagulants but is on antiplatelet agents (clopidogrel).  On exam, there is a large laceration across his forehead, but he is neurologically intact.  He will be sent for CT of head and cervical spine, laceration will need suture closure.   Delora Fuel, MD [phone]

## 2018-12-10 NOTE — ED Triage Notes (Signed)
Pt BIB [facility] from, reports pt was dreaming that he was falling and fell out of the bed, hitting his head on the windowsill. Laceration to forehead, wrapped pta, bleeding controlled at this time. Pt currently taking coumadin. A&O x 4, denies headache/dizziness, moves all extremities well.

## 2018-12-10 NOTE — ED Provider Notes (Signed)
Physical Exam  BP (!) 149/102   Pulse 95   Temp 98.5 F (36.9 C) (Oral)   Resp 15   SpO2 95%   Physical Exam Vitals signs and nursing note reviewed.  Constitutional:      General: He is not in acute distress.    Appearance: He is well-developed. He is not diaphoretic.  HENT:     Head: Normocephalic and atraumatic.  Eyes:     General: No scleral icterus.    Conjunctiva/sclera: Conjunctivae normal.  Neck:     Musculoskeletal: Normal range of motion.  Pulmonary:     Effort: Pulmonary effort is normal. No respiratory distress.  Skin:    Findings: No rash.  Neurological:     Mental Status: He is alert.     ED Course/Procedures     Procedures  MDM  Care handed off from previous provider PA McDonald.  Please see their note for further detail.  Briefly, patient 68 year old male currently anticoagulated on Plavix with a history of CAD, hypertension, hyperlipidemia who presents to the ED after a fall.  States that he hit his head on a windowsill last night.  Laceration noted on forehead, associated headache.  Wound closed by previous provider.  Initial CT scan shows small subarachnoid hemorrhage.  Neurosurgery was consulted who recommends obtaining a repeat CT at 10 AM to evaluate for progression.  Will dispose accordingly.  11:12 AM Ct Head Wo Contrast  Result Date: 12/10/2018 CLINICAL DATA:  The patient suffered a fall out of bed 12/10/2018 resulting in small intracranial hemorrhage. Initial encounter. EXAM: CT HEAD WITHOUT CONTRAST TECHNIQUE: Contiguous axial images were obtained from the base of the skull through the vertex without intravenous contrast. COMPARISON:  Head CT 12/10/2018. FINDINGS: Brain: Punctate focus of increased attenuation between the right and left cerebral hemispheres consistent with hemorrhage is unchanged. The brain is atrophic with chronic microvascular ischemic change. Remote lacunar infarction left caudate head noted. No new hemorrhage, midline shift,  hydrocephalus or pneumocephalus. Vascular: Atherosclerosis. Skull: No fracture or focal bony lesion. Sinuses/Orbits: Mucous retention cyst or polyp left sphenoid sinus noted. Other: Laceration and contusion on the forehead again seen. IMPRESSION: No change in a small inter hemispheric hemorrhage since the prior examination. No new abnormality. Electronically Signed   By: Inge Rise M.D.   On: 12/10/2018 10:36   Ct Head Wo Contrast  Result Date: 12/10/2018 CLINICAL DATA:  Neck pain after fall from bed EXAM: CT HEAD WITHOUT CONTRAST CT CERVICAL SPINE WITHOUT CONTRAST TECHNIQUE: Multidetector CT imaging of the head and cervical spine was performed following the standard protocol without intravenous contrast. Multiplanar CT image reconstructions of the cervical spine were also generated. COMPARISON:  11/01/2006 FINDINGS: CT HEAD FINDINGS Brain: Tiny interhemispheric hemorrhage, subarachnoid appearing, convincing based on prior. Mild white matter low-density. No infarct or hydrocephalus. Vascular: Atherosclerotic calcification Skull: Forehead laceration with soft tissue gas. No opaque foreign body or fracture Sinuses/Orbits: Negative CT CERVICAL SPINE FINDINGS Alignment: No traumatic malalignment. Degenerative reversal of cervical lordosis with C2-3, C7-T1, and T1-2 mild anterolisthesis Skull base and vertebrae: Negative for fracture Soft tissues and spinal canal: No prevertebral fluid or swelling. No visible canal hematoma. Disc levels: Lower cervical degenerative disc narrowing and ridging. Mainly upper and lower cervical degenerative facet spurring. Upper chest: Negative Critical Value/emergent results were called by telephone at the time of interpretation on 12/10/2018 at 5:19 am to [place] , who verbally acknowledged these results. IMPRESSION: 1. Tiny interhemispheric subarachnoid hemorrhage. 2. Forehead laceration without opaque foreign  body or fracture. 3. Negative for cervical spine fracture  Electronically Signed   By: Monte Fantasia M.D.   On: 12/10/2018 05:20   Ct Cervical Spine Wo Contrast  Result Date: 12/10/2018 CLINICAL DATA:  Neck pain after fall from bed EXAM: CT HEAD WITHOUT CONTRAST CT CERVICAL SPINE WITHOUT CONTRAST TECHNIQUE: Multidetector CT imaging of the head and cervical spine was performed following the standard protocol without intravenous contrast. Multiplanar CT image reconstructions of the cervical spine were also generated. COMPARISON:  11/01/2006 FINDINGS: CT HEAD FINDINGS Brain: Tiny interhemispheric hemorrhage, subarachnoid appearing, convincing based on prior. Mild white matter low-density. No infarct or hydrocephalus. Vascular: Atherosclerotic calcification Skull: Forehead laceration with soft tissue gas. No opaque foreign body or fracture Sinuses/Orbits: Negative CT CERVICAL SPINE FINDINGS Alignment: No traumatic malalignment. Degenerative reversal of cervical lordosis with C2-3, C7-T1, and T1-2 mild anterolisthesis Skull base and vertebrae: Negative for fracture Soft tissues and spinal canal: No prevertebral fluid or swelling. No visible canal hematoma. Disc levels: Lower cervical degenerative disc narrowing and ridging. Mainly upper and lower cervical degenerative facet spurring. Upper chest: Negative Critical Value/emergent results were called by telephone at the time of interpretation on 12/10/2018 at 5:19 am to [place] , who verbally acknowledged these results. IMPRESSION: 1. Tiny interhemispheric subarachnoid hemorrhage. 2. Forehead laceration without opaque foreign body or fracture. 3. Negative for cervical spine fracture Electronically Signed   By: Monte Fantasia M.D.   On: 12/10/2018 05:20     Repeat CT scan without any changes.  Patient remains in no acute distress.  Given instructions for wound care and follow-up.  Patient agreeable to plan.  We will have him return for worsening symptoms.  Patient is hemodynamically stable, in NAD, and able  to ambulate in the ED. Evaluation does not show pathology that would require ongoing emergent intervention or inpatient treatment. I explained the diagnosis to the patient. Pain has been managed and has no complaints prior to discharge. Patient is comfortable with above plan and is stable for discharge at this time. All questions were answered prior to disposition. Strict return precautions for returning to the ED were discussed. Encouraged follow up with PCP.   An After Visit Summary was printed and given to the patient.   Portions of this note were generated with [employer]. Dictation errors may occur despite best attempts at proofreading.       Delia Heady, PA-C [phone]    [place], [place] [id number]

## 2018-12-10 NOTE — ED Notes (Signed)
Patient transported to CT 

## 2018-12-10 NOTE — ED Notes (Signed)
EKG not indicated at this time per PA.

## 2018-12-10 NOTE — ED Notes (Signed)
Transported to CT 

## 2018-12-10 NOTE — Discharge Instructions (Signed)
Thank you for allowing me to care for you today in the Emergency Department.   You can take 650 mg of Tylenol once every 6 hours for pain control.  To care for your wound at home, gently clean the wound with warm water and soap at least once daily.  Your sutures need to be removed in 5 to 7 days.  You can have these removed at primary care, urgent care, or by returning to the ER.  If you develop fever, chills, or if the wound becomes red, hot to the touch, or starts to have thick, mucus-like drainage, you need to have the wound reevaluated as these are signs of infection.

## 2018-12-16 DIAGNOSIS — S0181XD Laceration without foreign body of other part of head, subsequent encounter: Secondary | ICD-10-CM | POA: Diagnosis not present

## 2018-12-16 DIAGNOSIS — Z4889 Encounter for other specified surgical aftercare: Secondary | ICD-10-CM | POA: Diagnosis not present

## 2018-12-16 DIAGNOSIS — Z23 Encounter for immunization: Secondary | ICD-10-CM | POA: Diagnosis not present

## 2018-12-24 DIAGNOSIS — Z0000 Encounter for general adult medical examination without abnormal findings: Secondary | ICD-10-CM | POA: Diagnosis not present

## 2018-12-24 DIAGNOSIS — F324 Major depressive disorder, single episode, in partial remission: Secondary | ICD-10-CM | POA: Diagnosis not present

## 2018-12-24 DIAGNOSIS — E7800 Pure hypercholesterolemia, unspecified: Secondary | ICD-10-CM | POA: Diagnosis not present

## 2018-12-24 DIAGNOSIS — I10 Essential (primary) hypertension: Secondary | ICD-10-CM | POA: Diagnosis not present

## 2018-12-25 DIAGNOSIS — I10 Essential (primary) hypertension: Secondary | ICD-10-CM | POA: Diagnosis not present

## 2018-12-25 DIAGNOSIS — F324 Major depressive disorder, single episode, in partial remission: Secondary | ICD-10-CM | POA: Diagnosis not present

## 2018-12-25 DIAGNOSIS — E7800 Pure hypercholesterolemia, unspecified: Secondary | ICD-10-CM | POA: Diagnosis not present

## 2018-12-25 DIAGNOSIS — Z0000 Encounter for general adult medical examination without abnormal findings: Secondary | ICD-10-CM | POA: Diagnosis not present

## 2019-01-20 DIAGNOSIS — H34831 Tributary (branch) retinal vein occlusion, right eye, with macular edema: Secondary | ICD-10-CM | POA: Diagnosis not present

## 2019-01-20 DIAGNOSIS — R519 Headache, unspecified: Secondary | ICD-10-CM | POA: Diagnosis not present

## 2019-01-20 DIAGNOSIS — H2513 Age-related nuclear cataract, bilateral: Secondary | ICD-10-CM | POA: Diagnosis not present

## 2019-02-17 DIAGNOSIS — H34831 Tributary (branch) retinal vein occlusion, right eye, with macular edema: Secondary | ICD-10-CM | POA: Diagnosis not present

## 2019-03-23 DIAGNOSIS — H34831 Tributary (branch) retinal vein occlusion, right eye, with macular edema: Secondary | ICD-10-CM | POA: Diagnosis not present

## 2019-05-20 DIAGNOSIS — R454 Irritability and anger: Secondary | ICD-10-CM | POA: Diagnosis not present

## 2019-05-20 DIAGNOSIS — G44309 Post-traumatic headache, unspecified, not intractable: Secondary | ICD-10-CM | POA: Diagnosis not present

## 2019-06-22 DIAGNOSIS — H34831 Tributary (branch) retinal vein occlusion, right eye, with macular edema: Secondary | ICD-10-CM | POA: Diagnosis not present

## 2019-06-25 DIAGNOSIS — H25013 Cortical age-related cataract, bilateral: Secondary | ICD-10-CM | POA: Diagnosis not present

## 2019-06-25 DIAGNOSIS — H348312 Tributary (branch) retinal vein occlusion, right eye, stable: Secondary | ICD-10-CM | POA: Diagnosis not present

## 2019-06-25 DIAGNOSIS — H35033 Hypertensive retinopathy, bilateral: Secondary | ICD-10-CM | POA: Diagnosis not present

## 2019-08-27 DIAGNOSIS — A689 Relapsing fever, unspecified: Secondary | ICD-10-CM | POA: Diagnosis not present

## 2019-08-27 DIAGNOSIS — R05 Cough: Secondary | ICD-10-CM | POA: Diagnosis not present

## 2019-08-27 DIAGNOSIS — R0602 Shortness of breath: Secondary | ICD-10-CM | POA: Diagnosis not present

## 2019-08-27 DIAGNOSIS — R5383 Other fatigue: Secondary | ICD-10-CM | POA: Diagnosis not present

## 2019-08-27 DIAGNOSIS — Z20822 Contact with and (suspected) exposure to covid-19: Secondary | ICD-10-CM | POA: Diagnosis not present

## 2019-10-21 ENCOUNTER — Other Ambulatory Visit: Payer: Self-pay

## 2019-10-21 DIAGNOSIS — I2510 Atherosclerotic heart disease of native coronary artery without angina pectoris: Secondary | ICD-10-CM

## 2019-10-21 MED ORDER — NITROGLYCERIN 0.4 MG SL SUBL: 0.4000 mg | SUBLINGUAL_TABLET | SUBLINGUAL | 0 refills | Status: AC | PRN

## 2019-10-21 NOTE — Telephone Encounter (Signed)
Pt's medication was sent to pt's pharmacy as requested. Confirmation received.  °

## 2019-10-31 IMAGING — CT CT CERVICAL SPINE W/O CM
5 of 8 series · 12 of 33 positions shown, 13 images · non-contrast
Comparison: 11/01/2006

CLINICAL DATA: Neck pain after fall from bed

EXAM:
CT HEAD WITHOUT CONTRAST
CT CERVICAL SPINE WITHOUT CONTRAST
TECHNIQUE: Multidetector CT imaging of the head and cervical spine was
performed following the standard protocol without intravenous
contrast. Multiplanar CT image reconstructions of the cervical spine
were also generated.

[Series 4: head bone · axial · 0.46mm/px · z∈[-97,-35]mm · 2 of 95 slices shown]
[im 32/95  bone]
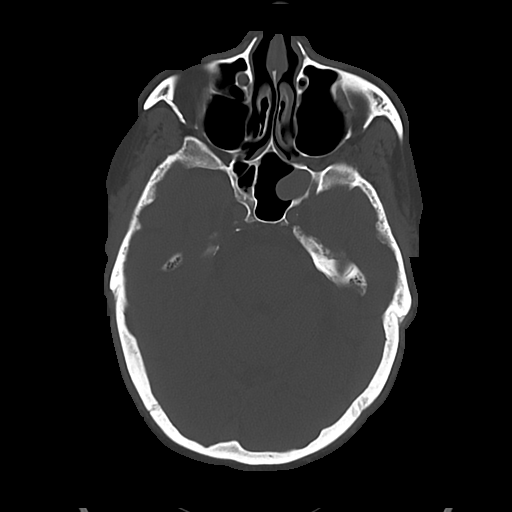
[im 63/95  bone]
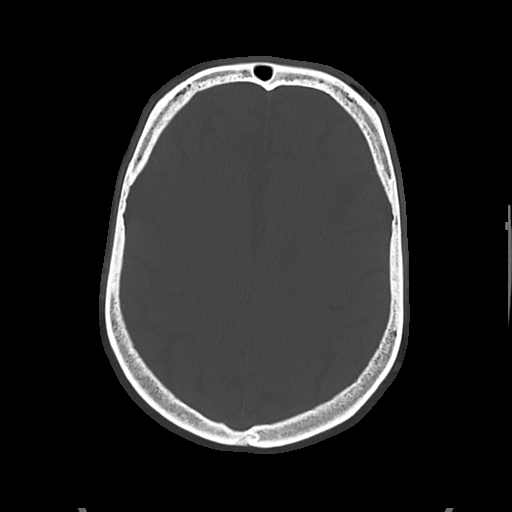

[Series 8: c spine soft · axial · 0.29mm/px · z∈[-218,-158]mm · 2 of 90 slices shown]
[im 30/90  soft-tissue]
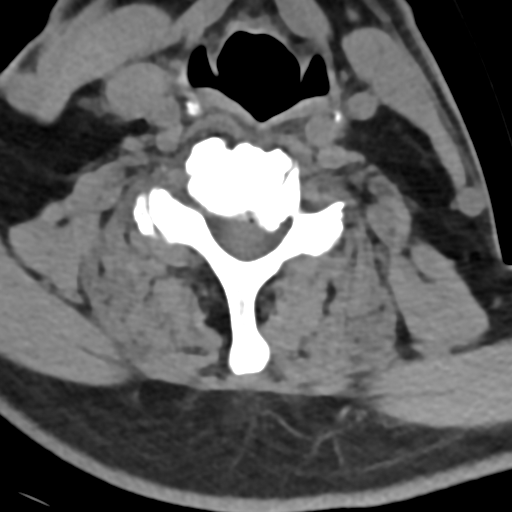
[im 60/90  soft-tissue]
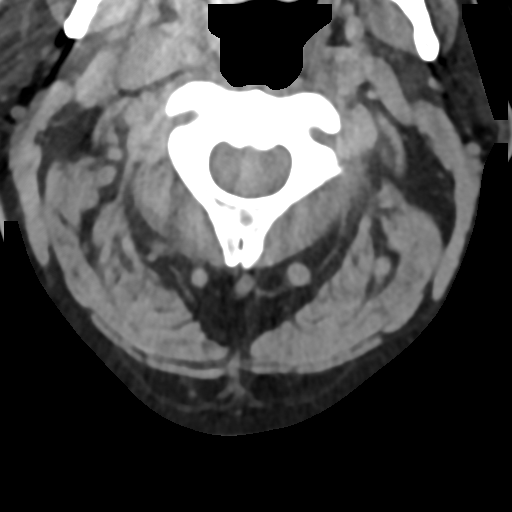

[Series 11: sag bone · sagittal · 0.24mm/px · 5 of 74 slices shown]
[im 13/74  bone]
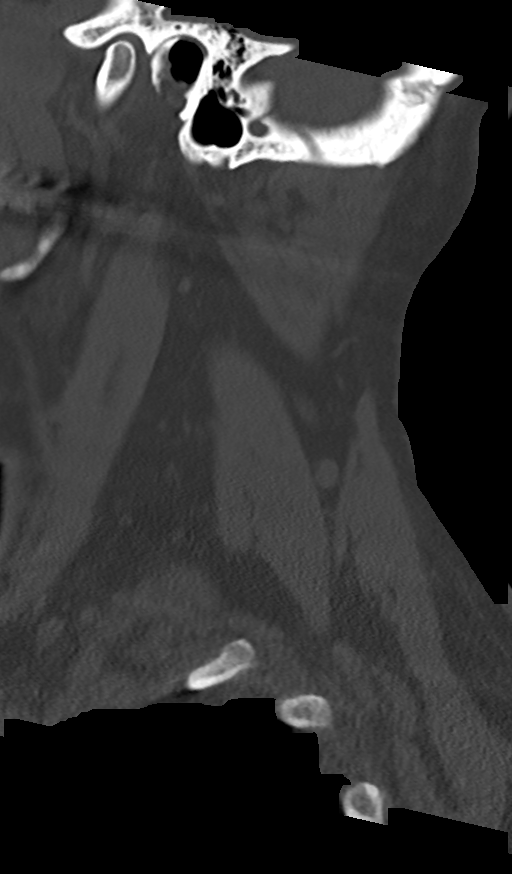
[im 25/74  bone]
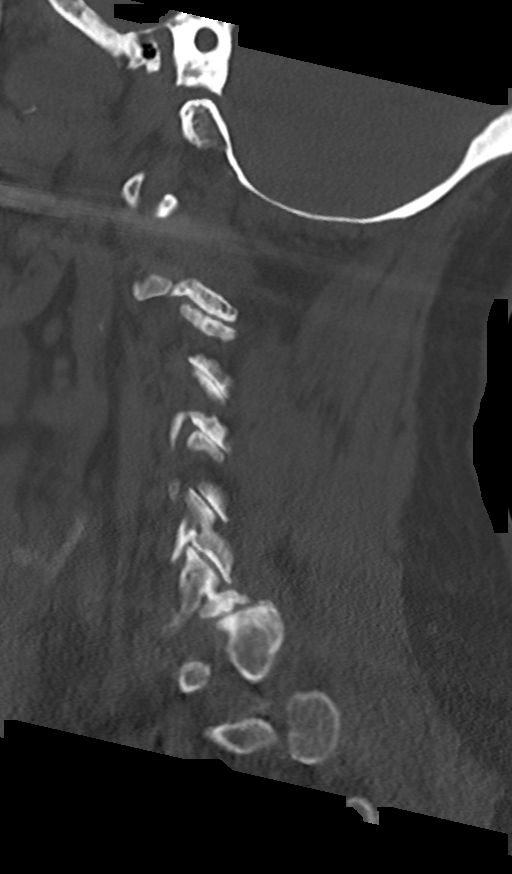
[im 37/74  bone]
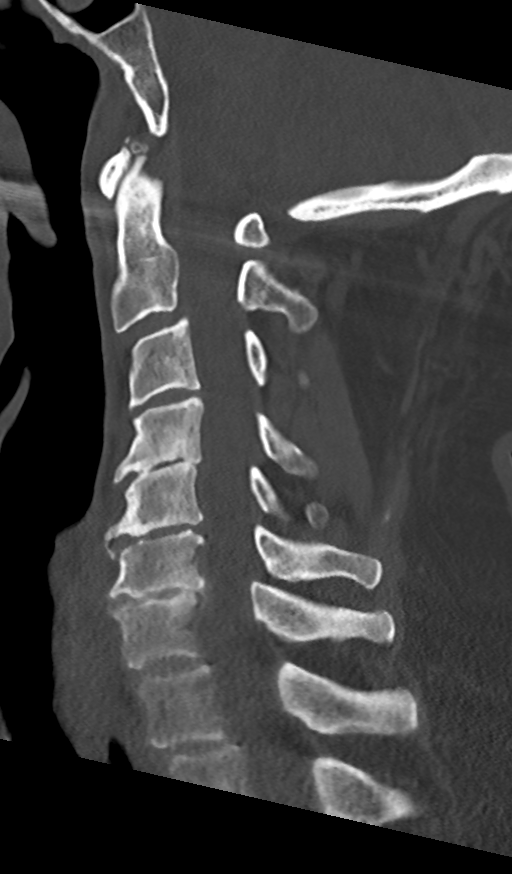
[im 49/74  bone]
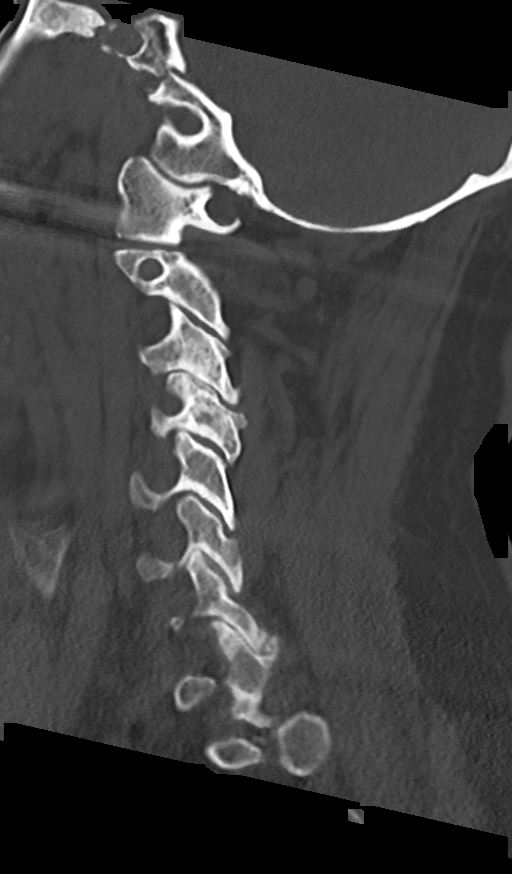
[im 61/74  bone]
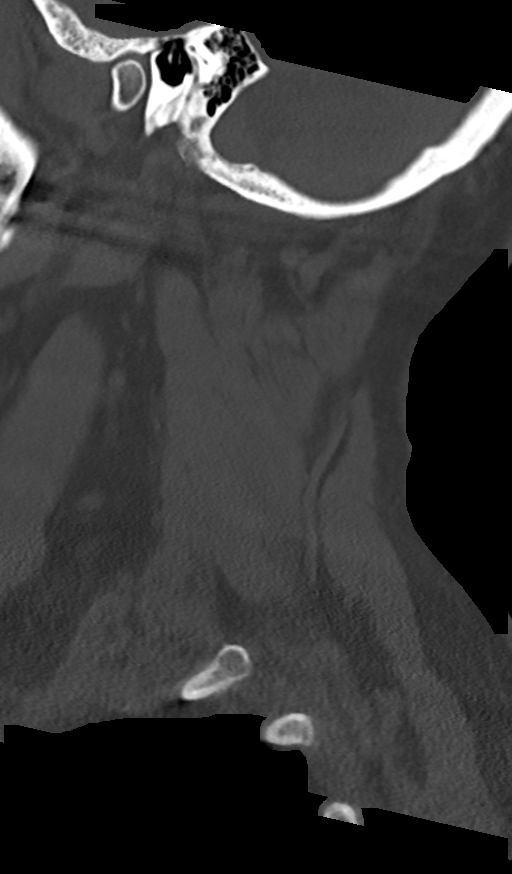

[Series 12: cor bone · coronal · 0.32mm/px · 1 of 73 slices shown]
[im 37/73  bone]
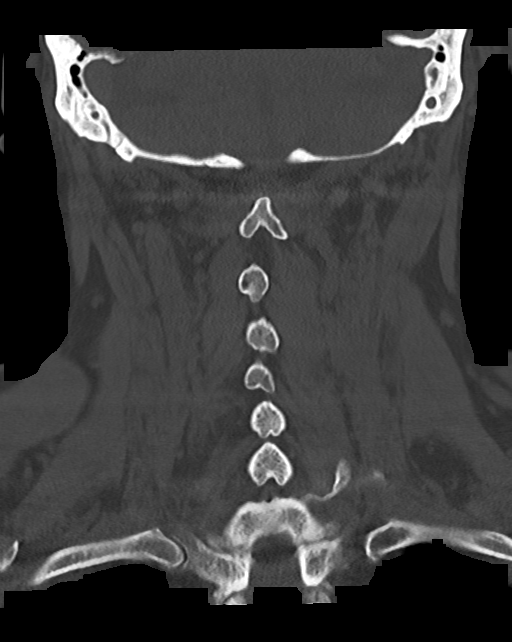

[Series 14: orthogonal axials · axial · 0.21mm/px · z∈[-242,-185]mm · 2 of 86 slices shown, 3 images]
[im 29/86  soft-tissue]
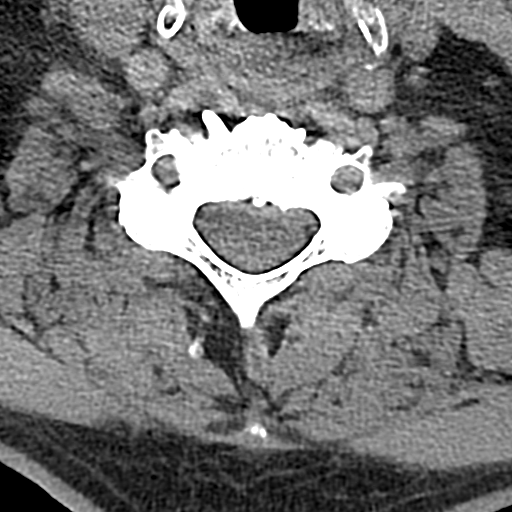
[im 29/86  bone]
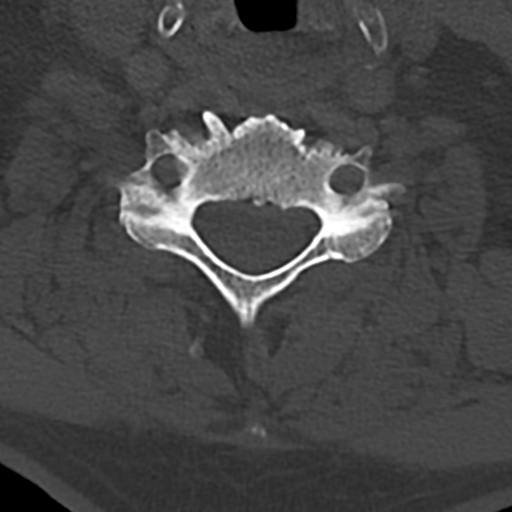
[im 57/86  bone]
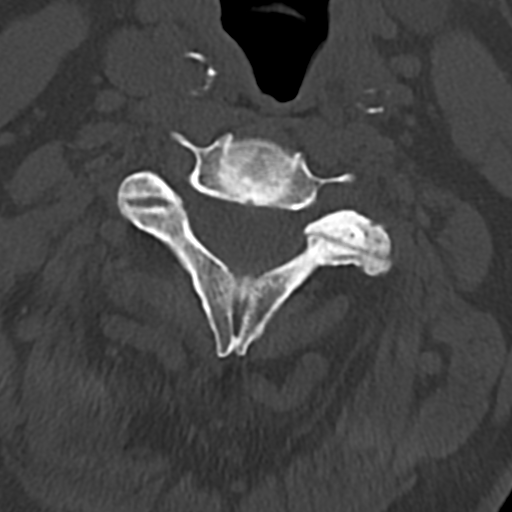

[12 of 33 positions shown; findings below may reference images not displayed]

FINDINGS: CT HEAD FINDINGS

Brain: Tiny interhemispheric hemorrhage, subarachnoid appearing,
convincing based on prior. Mild white matter low-density. No infarct
or hydrocephalus.

Vascular: Atherosclerotic calcification

Skull: Forehead laceration with soft tissue gas. No opaque foreign
body or fracture

Sinuses/Orbits: Negative

CT CERVICAL SPINE FINDINGS

Alignment: No traumatic malalignment. Degenerative reversal of
cervical lordosis with C2-3, C7-T1, and T1-2 mild anterolisthesis

Skull base and vertebrae: Negative for fracture

Soft tissues and spinal canal: No prevertebral fluid or swelling. No
visible canal hematoma.

Disc levels: Lower cervical degenerative disc narrowing and ridging.
Mainly upper and lower cervical degenerative facet spurring.

Upper chest: Negative

Critical Value/emergent results were called by telephone at the time
of interpretation on 12/10/2018 at [DATE] to [place] ,
who verbally acknowledged these results.
IMPRESSION: 1. Tiny interhemispheric subarachnoid hemorrhage.
2. Forehead laceration without opaque foreign body or fracture.
3. Negative for cervical spine fracture

## 2020-05-22 ENCOUNTER — Ambulatory Visit: Payer: Medicare Other | Admitting: Cardiology
